# Patient Record
Sex: Female | Born: 1940 | Race: White | Hispanic: No | Marital: Married | Smoking: Never smoker
Health system: Southern US, Community
[De-identification: ages and names within clinical notes are randomized; demographics above are authoritative.]

## PROBLEM LIST (undated history)

## (undated) DIAGNOSIS — Z950 Presence of cardiac pacemaker: Secondary | ICD-10-CM

## (undated) DIAGNOSIS — I493 Ventricular premature depolarization: Secondary | ICD-10-CM

## (undated) DIAGNOSIS — I442 Atrioventricular block, complete: Secondary | ICD-10-CM

## (undated) DIAGNOSIS — Z9889 Other specified postprocedural states: Secondary | ICD-10-CM

## (undated) DIAGNOSIS — M1990 Unspecified osteoarthritis, unspecified site: Secondary | ICD-10-CM

## (undated) DIAGNOSIS — K219 Gastro-esophageal reflux disease without esophagitis: Secondary | ICD-10-CM

## (undated) DIAGNOSIS — I10 Essential (primary) hypertension: Secondary | ICD-10-CM

## (undated) DIAGNOSIS — B029 Zoster without complications: Secondary | ICD-10-CM

## (undated) HISTORY — DX: Essential (primary) hypertension: I10

## (undated) HISTORY — PX: CHOLECYSTECTOMY: SHX55

## (undated) HISTORY — PX: JOINT REPLACEMENT: SHX530

## (undated) HISTORY — PX: OTHER SURGICAL HISTORY: SHX169

## (undated) HISTORY — DX: Other specified postprocedural states: Z98.890

## (undated) HISTORY — DX: Zoster without complications: B02.9

## (undated) HISTORY — DX: Ventricular premature depolarization: I49.3

## (undated) HISTORY — DX: Atrioventricular block, complete: I44.2

## (undated) HISTORY — DX: Unspecified osteoarthritis, unspecified site: M19.90

## (undated) HISTORY — DX: Presence of cardiac pacemaker: Z95.0

## (undated) HISTORY — PX: ERCP: SHX60

---

## 1999-12-05 ENCOUNTER — Ambulatory Visit (HOSPITAL_COMMUNITY): Admission: RE | Admit: 1999-12-05 | Discharge: 1999-12-05 | Payer: Self-pay | Admitting: Cardiology

## 2004-04-22 ENCOUNTER — Ambulatory Visit (HOSPITAL_COMMUNITY): Admission: RE | Admit: 2004-04-22 | Discharge: 2004-04-22 | Payer: Self-pay | Admitting: Emergency Medicine

## 2007-04-27 ENCOUNTER — Encounter: Admission: RE | Admit: 2007-04-27 | Discharge: 2007-04-27 | Payer: Self-pay | Admitting: Orthopaedic Surgery

## 2007-07-02 ENCOUNTER — Ambulatory Visit: Payer: Self-pay | Admitting: Internal Medicine

## 2007-07-03 ENCOUNTER — Ambulatory Visit (HOSPITAL_COMMUNITY): Admission: RE | Admit: 2007-07-03 | Discharge: 2007-07-03 | Payer: Self-pay | Admitting: Internal Medicine

## 2007-07-03 ENCOUNTER — Ambulatory Visit: Payer: Self-pay | Admitting: Internal Medicine

## 2007-07-13 ENCOUNTER — Ambulatory Visit: Payer: Self-pay

## 2007-07-14 ENCOUNTER — Ambulatory Visit: Payer: Self-pay | Admitting: Internal Medicine

## 2007-07-14 ENCOUNTER — Ambulatory Visit (HOSPITAL_COMMUNITY): Admission: RE | Admit: 2007-07-14 | Discharge: 2007-07-15 | Payer: Self-pay | Admitting: Internal Medicine

## 2007-07-22 ENCOUNTER — Ambulatory Visit: Payer: Self-pay

## 2007-08-06 ENCOUNTER — Encounter: Admission: RE | Admit: 2007-08-06 | Discharge: 2007-08-06 | Payer: Self-pay | Admitting: Orthopaedic Surgery

## 2007-09-02 ENCOUNTER — Ambulatory Visit: Payer: Self-pay | Admitting: Internal Medicine

## 2007-10-15 ENCOUNTER — Encounter: Admission: RE | Admit: 2007-10-15 | Discharge: 2007-10-15 | Payer: Self-pay | Admitting: Orthopaedic Surgery

## 2007-11-23 ENCOUNTER — Encounter: Admission: RE | Admit: 2007-11-23 | Discharge: 2007-11-23 | Payer: Self-pay | Admitting: Orthopedic Surgery

## 2007-12-03 ENCOUNTER — Ambulatory Visit (HOSPITAL_COMMUNITY): Admission: RE | Admit: 2007-12-03 | Discharge: 2007-12-04 | Payer: Self-pay | Admitting: Orthopedic Surgery

## 2008-01-25 ENCOUNTER — Encounter: Admission: RE | Admit: 2008-01-25 | Discharge: 2008-01-25 | Payer: Self-pay | Admitting: Orthopedic Surgery

## 2008-09-06 ENCOUNTER — Ambulatory Visit: Payer: Self-pay | Admitting: Internal Medicine

## 2008-09-30 HISTORY — PX: BACK SURGERY: SHX140

## 2008-12-07 ENCOUNTER — Ambulatory Visit: Payer: Self-pay | Admitting: Internal Medicine

## 2008-12-09 ENCOUNTER — Encounter: Payer: Self-pay | Admitting: Internal Medicine

## 2008-12-12 ENCOUNTER — Encounter: Payer: Self-pay | Admitting: Internal Medicine

## 2008-12-12 ENCOUNTER — Ambulatory Visit: Payer: Self-pay | Admitting: Internal Medicine

## 2008-12-22 ENCOUNTER — Ambulatory Visit: Payer: Self-pay | Admitting: *Deleted

## 2009-01-11 ENCOUNTER — Inpatient Hospital Stay (HOSPITAL_COMMUNITY): Admission: RE | Admit: 2009-01-11 | Discharge: 2009-01-15 | Payer: Self-pay | Admitting: Orthopedic Surgery

## 2009-01-12 ENCOUNTER — Ambulatory Visit: Payer: Self-pay | Admitting: Vascular Surgery

## 2009-01-12 ENCOUNTER — Encounter (INDEPENDENT_AMBULATORY_CARE_PROVIDER_SITE_OTHER): Payer: Self-pay | Admitting: Orthopedic Surgery

## 2009-03-08 ENCOUNTER — Ambulatory Visit: Payer: Self-pay | Admitting: Internal Medicine

## 2009-03-09 ENCOUNTER — Encounter: Payer: Self-pay | Admitting: Internal Medicine

## 2009-03-23 ENCOUNTER — Encounter: Payer: Self-pay | Admitting: Internal Medicine

## 2009-05-18 ENCOUNTER — Telehealth (INDEPENDENT_AMBULATORY_CARE_PROVIDER_SITE_OTHER): Payer: Self-pay | Admitting: *Deleted

## 2009-06-09 ENCOUNTER — Encounter: Payer: Self-pay | Admitting: Internal Medicine

## 2009-06-12 ENCOUNTER — Encounter: Payer: Self-pay | Admitting: Internal Medicine

## 2009-06-13 ENCOUNTER — Ambulatory Visit: Payer: Self-pay | Admitting: Internal Medicine

## 2009-06-15 ENCOUNTER — Encounter: Admission: RE | Admit: 2009-06-15 | Discharge: 2009-06-15 | Payer: Self-pay | Admitting: Orthopedic Surgery

## 2009-06-22 ENCOUNTER — Encounter: Payer: Self-pay | Admitting: Internal Medicine

## 2009-09-11 ENCOUNTER — Ambulatory Visit: Payer: Self-pay | Admitting: Internal Medicine

## 2009-12-13 ENCOUNTER — Ambulatory Visit: Payer: Self-pay | Admitting: Internal Medicine

## 2009-12-26 ENCOUNTER — Encounter: Payer: Self-pay | Admitting: Internal Medicine

## 2010-03-14 ENCOUNTER — Ambulatory Visit: Payer: Self-pay | Admitting: Internal Medicine

## 2010-03-15 ENCOUNTER — Encounter: Payer: Self-pay | Admitting: Internal Medicine

## 2010-04-12 ENCOUNTER — Encounter: Payer: Self-pay | Admitting: Internal Medicine

## 2010-06-14 ENCOUNTER — Ambulatory Visit: Payer: Self-pay | Admitting: Internal Medicine

## 2010-07-11 ENCOUNTER — Encounter: Payer: Self-pay | Admitting: Internal Medicine

## 2010-10-02 ENCOUNTER — Ambulatory Visit
Admission: RE | Admit: 2010-10-02 | Discharge: 2010-10-02 | Payer: Self-pay | Source: Home / Self Care | Attending: Internal Medicine | Admitting: Internal Medicine

## 2010-10-02 ENCOUNTER — Encounter: Payer: Self-pay | Admitting: Internal Medicine

## 2010-10-08 ENCOUNTER — Telehealth: Payer: Self-pay | Admitting: Internal Medicine

## 2010-10-30 NOTE — Letter (Signed)
Summary: Remote Device Check  [employer], Main Office  [address]   [place], [place] [address]   Phone: [phone]  Fax: [phone]     December 26, 2009 MRN: [id number]   Amanda Bryant [address] [place], [place]  [address]   Dear [name],   Your remote transmission was recieved and reviewed by your physician.  All diagnostics were within normal limits for you.  __X___Your next transmission is scheduled for:  March 14, 2010.  Please transmit at any time this day.  If you have a wireless device your transmission will be sent automatically.     Sincerely,  Proofreader

## 2010-10-30 NOTE — Letter (Signed)
Summary: Remote Device Check  [employer], Main Office  [address]   [place], [place] [address]   Phone: [phone]  Fax: [phone]     April 12, 2010 MRN: [id number]   Amanda Bryant [address] [place], [place]  [address]   Dear Ms. Callender,   Your remote transmission was recieved and reviewed by your physician.  All diagnostics were within normal limits for you.  __X___Your next transmission is scheduled for:   06-14-2010.  Please transmit at any time this day.  If you have a wireless device your transmission will be sent automatically.   Sincerely,  Vella Kohler

## 2010-10-30 NOTE — Letter (Signed)
Summary: Remote Device Check  [facility] [employer], Main Office  [address]   [place], [place] [address]   Phone: [phone]  Fax: [phone]     July 11, 2010 MRN: [id number]   ZARIELLE CEA [address] [place], [place]  [address]   Dear Ms. Googe,   Your remote transmission was recieved and reviewed by your physician.  All diagnostics were within normal limits for you.   ___X___Your next office visit is scheduled for:  10-02-2010 with Dr Graciela Husbands in [place].    Sincerely,  Vella Kohler

## 2010-10-30 NOTE — Cardiovascular Report (Signed)
Summary: Office Visit Remote   Office Visit Remote   Imported By: Roderic Ovens 04/16/2010 12:28:27  _____________________________________________________________________  External Attachment:    Type:   Image     Comment:   External Document

## 2010-10-30 NOTE — Cardiovascular Report (Signed)
Summary: Office Visit Remote   Office Visit Remote   Imported By: Roderic Ovens 12/28/2009 11:53:08  _____________________________________________________________________  External Attachment:    Type:   Image     Comment:   External Document

## 2010-10-30 NOTE — Cardiovascular Report (Signed)
Summary: Office Visit Remote   Office Visit Remote   Imported By: Roderic Ovens 07/12/2010 11:05:28  _____________________________________________________________________  External Attachment:    Type:   Image     Comment:   External Document

## 2010-11-01 NOTE — Progress Notes (Signed)
Summary: premed   Phone Note From Other Clinic   Caller: dental ofc Summary of Call: does pt need to have premeds for dental work. ofc [phone] fax [phone] Initial call taken by: Edman Circle,  October 08, 2010 2:52 PM  Follow-up for Phone Call        the dental office has been pre medicating due to murmor. see murmor 1-2/6 documentation from year ago and adv to continue to medicate as they have been for previous years. They will also let the pt know that it is time for f/u W/Dr. Graciela Husbands.  Follow-up by: Claris Gladden RN,  October 08, 2010 3:07 PM

## 2010-11-01 NOTE — Procedures (Signed)
Summary: Cardiology Device Clinic    Allergies: No Known Drug Allergies  PPM Specifications Following MD:  Sherryl Manges, MD     PPM Vendor:  Medtronic     PPM Model Number:  [id number]     PPM Serial Number:  [id number] PPM DOI:  07/14/2007     PPM Implanting MD:  Sherryl Manges, MD  Lead 1    Location: RA     DOI: 07/14/2007     Model #: [id number]     Serial #: [id number]     Status: active Lead 2    Location: RV     DOI: 07/14/2007     Model #: [id number]     Serial #: [id number]     Status: active  Magnet Response Rate:  BOL 85 ERI  65  Indications:  CHB;Syncope   PPM Follow Up Remote Check?  No Battery Voltage:  2.77 V     Battery Est. Longevity:  6.5 years     Pacer Dependent:  No       PPM Device Measurements Atrium  Amplitude: 2.8 mV, Impedance: 512 ohms, Threshold: 0.5 V at 0.4 msec Right Ventricle  Amplitude: 8.0 mV, Impedance: 479 ohms, Threshold: 0.5 V at 0.4 msec  Episodes MS Episodes:  2     Percent Mode Switch:  <0.1%     Coumadin:  No Ventricular High Rate:  1     Atrial Pacing:  0.9%     Ventricular Pacing:  1.1%  Parameters Mode:  DDD+     Lower Rate Limit:  50     Upper Rate Limit:  130 Paced AV Delay:  190     Sensed AV Delay:  160 Next Remote Date:  01/03/2011     Next Cardiology Appt Due:  10/01/2011 Tech Comments:  >254 rate drop episodes.  1VHR episode 4 seconds probable VT.  No parameter changes.  Device function normal.   [facility] transmissions every 3 months.  ROV 1 year with Dr. Graciela Husbands in [place]. Altha Harm, LPN  October 02, 2010 9:22 AM

## 2011-01-03 ENCOUNTER — Ambulatory Visit (INDEPENDENT_AMBULATORY_CARE_PROVIDER_SITE_OTHER): Payer: Self-pay | Admitting: *Deleted

## 2011-01-03 DIAGNOSIS — I442 Atrioventricular block, complete: Secondary | ICD-10-CM

## 2011-01-03 DIAGNOSIS — Z950 Presence of cardiac pacemaker: Secondary | ICD-10-CM

## 2011-01-03 DIAGNOSIS — R0989 Other specified symptoms and signs involving the circulatory and respiratory systems: Secondary | ICD-10-CM

## 2011-01-04 ENCOUNTER — Other Ambulatory Visit: Payer: Self-pay

## 2011-01-07 NOTE — Progress Notes (Signed)
Pacer remote check  

## 2011-01-09 LAB — HEMOGLOBIN AND HEMATOCRIT, BLOOD
HCT: 31 % — ABNORMAL LOW (ref 36.0–46.0)
HCT: 34.7 % — ABNORMAL LOW (ref 36.0–46.0)
Hemoglobin: 10.9 g/dL — ABNORMAL LOW (ref 12.0–15.0)

## 2011-01-09 LAB — TYPE AND SCREEN
ABO/RH(D): A NEG
Antibody Screen: NEGATIVE

## 2011-01-09 LAB — ABO/RH: ABO/RH(D): A NEG

## 2011-01-09 LAB — CBC
MCHC: 34.7 g/dL (ref 30.0–36.0)
MCV: 91.9 fL (ref 78.0–100.0)
Platelets: 191 10*3/uL (ref 150–400)
RDW: 13.3 % (ref 11.5–15.5)

## 2011-01-09 LAB — CROSSMATCH: ABO/RH(D): A NEG

## 2011-01-09 LAB — BASIC METABOLIC PANEL
BUN: 12 mg/dL (ref 6–23)
CO2: 29 mEq/L (ref 19–32)
Chloride: 107 mEq/L (ref 96–112)
Creatinine, Ser: 0.62 mg/dL (ref 0.4–1.2)

## 2011-01-20 ENCOUNTER — Encounter: Payer: Self-pay | Admitting: *Deleted

## 2011-01-21 ENCOUNTER — Encounter: Payer: Self-pay | Admitting: Cardiovascular Disease

## 2011-02-12 NOTE — Assessment & Plan Note (Signed)
[facility]                         ELECTROPHYSIOLOGY OFFICE NOTE   Amanda Bryant, Amanda Bryant                       MRN:          [id number]  DATE:07/22/2007                            DOB:          1941/06/17    Amanda Bryant was seen today in the clinic on July 22, 2007, for a wound  check of her newly-implanted Medtronic model number [id number] Adapta.  Date  of implant was July 14, 2007, for complete heart block and syncope.   On interrogation of her device today, her battery voltage is 2.79, P-  waves measured 1.4 to 2 millivolts with an atrial capture threshold of  0.25 volts at 0.4 milliseconds and an atrial lead impedance at 662 ohms.  R-waves measured 8 to 11.20 millivolts with a ventricular capture  threshold of 0.5 volts at 0.4 milliseconds and a ventricular lead  impedance of 662 ohms.  Underlying rhythm today is a sinus rhythm at 76  beats a minute.  She is only ventricularly pacing 0.4% of the time.  Capture adaptive is programmed on in both the A and V.   Her Steri-Strips were removed, both wounds from where her loop was  removed and the pacer insertion are without redness, slight amount of  edema at the pacer insertion site.  No changes were made in her  parameters and she will be seen back in December.      Altha Harm, LPN  Electronically Signed      Duke Salvia, MD, [facility]  Electronically Signed   PO/MedQ  DD: 07/22/2007  DT: 07/22/2007  Job #: [id number]

## 2011-02-12 NOTE — Op Note (Signed)
NAMEDEBRAH, Amanda Bryant                ACCOUNT NO.:  [id number]   MEDICAL RECORD NO.:  [id number]          PATIENT TYPE:  INP   LOCATION:  5017                         FACILITY:  [facility]   PHYSICIAN:  Alvy Beal, MD    DATE OF BIRTH:  1941/07/15   DATE OF PROCEDURE:  01/11/2009  DATE OF DISCHARGE:                               OPERATIVE REPORT   PREOPERATIVE DIAGNOSIS:  Degenerative spondylolisthesis with diskogenic  back pain, L5-S1.   POSTOPERATIVE DIAGNOSIS:  Degenerative spondylolisthesis with diskogenic  back pain, L5-S1.   OPERATIVE PROCEDURE:  Anterior lumbar interbody fusion, L5-S1.   COMPLICATIONS:  None.   CONDITION:  Stable.   FIRST ASSISTANT:  Crissie Reese, PA.   INSTRUMENTATION USED:  Stryker PEEK interbody cage.  It was a 8-degree  lordotic, 14-mm high cage packed with Actifuse with the RSB anterior  zero profile lumbar plate.   HISTORY:  This is a very pleasant 70 year old woman who has been having  chronic debilitating low back pain for several years now.  She had a  previous L5-S1 diskectomy, but she still has persistent back pain.  Attempts at conservative management had included injection therapy,  narcotic and nonnarcotic pain medications, physiotherapy manipulations,  and nothing really has significantly helped her.  After discussing the  treatment options with the patient, she consented to the aforementioned  procedure.  All appropriate risks, benefits, and alternatives were  discussed.   OPERATIVE NOTE:  The patient was brought to the operating room and  placed supine on the operating table.  After successful induction of  general anesthesia and endotracheal intubation, TED, SCDs, and a Foley  were inserted.  At this point in time, the abdomen was prepped and  draped in standard fashion.  Dr. Liliane Bade then scrubbed into the room  to do a standard anterior retroperitoneal approach to the lumbar spine.   Once he had inserted all the appropriate  retractors and fully exposed  the L5-S1 disk space, I scrubbed back into the room.  I marked the  center of the L5-S1 disk with a needle, took an AP and lateral films,  and confirmed that we are at the appropriate level.  Once this was  confirmed, I then proceeded with the diskectomy.   A 10-blade scalpel was used to perform an annulotomy and then using a  combination of pituitary rongeurs, curettes, and Kerrison rongeurs, I  resected the entire L5-S1 disk.  I then spread the interbody space with  my lamina spreader and then began working posteriorly.  I continued to  resect more and more of the annulus.  I then used the posterior  longitudinal stripper to release the annulus from the posterior aspect  of L5 and S1.  I then placed a nerve hook behind both vertebral bodies  and confirmed that I had an adequate diskectomy.  I was able to pass  freely behind the vertebral body and there was no evidence of any  significant.  I then took a 3-and 4-mm Kerrison and resected the  posterior osteophytes from the vertebral bodies.   Once I had  an adequate diskectomy and decompression complete, I then  rasped the endplates and measured with trial prosthesis.  I elected to  use the lordotic 14-mm high interbody cage as excellent purchase and  stability.  I packed the graft with Actifuse and inserted atraumatically  with the inserter device.  I then countersunk it gently.  I checked x-  rays to confirm I had satisfactory position of the prosthesis.  Once  confirmed, I then placed Actifuse anterior to the graft first to  encourage a sentinel fusion.   At this point, the RSB anterior zero profile lumbar plate was inserted  over the interbody spacer and secured with 3 screws.  Two into L5 and  one into S1.  All screws were properly torqued down and the final lock  anti-back out plate was secured.  I had excellent purchase of the  screws.  Excellent positioning of the hardware.  I took final AP and   lateral x-rays, which confirmed satisfactory position of the prosthesis,  of the graft, and hardware.  At this point, I then irrigated the wound  copiously with normal saline and then I sequentially removed the  retractor blades making sure that there was no bleeding.  I then closed  the rectus fascia with a running #1 Vicryl suture and then I closed in  sequential with a 0 running and interrupted 2-0 Vicryl sutures.  I then  closed the skin with 3-0 Monocryl.  A final intraoperative AP digital  film was taken and read by the radiologist confirming that the only  hardware present was the implanted devices.  There were no other  surgical instrumentation present.  In addition, the needle and sponge  counts were correct.  Steri-Strips and a dry dressing was applied.  The  patient was extubated and transferred to the PACU without incident.      Alvy Beal, MD  Electronically Signed     DDB/MEDQ  D:  01/11/2009  T:  01/12/2009  Job:  [id number]   cc:   Balinda Quails, M.D.

## 2011-02-12 NOTE — Assessment & Plan Note (Signed)
[facility]                                 ON-CALL NOTE   Amanda, Bryant                         MRN:          [id number]  DATE:07/11/2007                            DOB:          Dec 01, 1940    PRIMARY CARDIOLOGIST:  Dr. Graciela Husbands.  Supervising physician is Dr. Myrtis Ser.   HISTORY:  [name] is a 70 year old female who recently was  hospitalized on July 03, 2007 by Dr. Graciela Husbands for a loop monitor  implantation secondary to near syncope of uncertain etiology.  At the  time of her discharge, she states that she was told by Dr. Graciela Husbands to call  in if she ever activated her monitor.  She stated at approximately 9:20  this morning she suddenly had the episode of feeling lightheadedness  followed by ringing in the ear, and some tingling.  Again, this lasted 5  to 10 seconds.  She did not actually lose consciousness.  She states  that she feels fine now.  She tells me that she has activated the  monitor as instructed, called as instructed.   She also relates that at the time of discharge she was told that someone  from the office would call her with a followup appointment, and no one  has called her to arrange this.  After reviewing E-chart information  available, and since [name] is feeling fine, and her episodes seem  to recur infrequently, I have asked her to continue to monitor.  If she  has another episode, I explained to her to please activate the monitor  and call us back, as she may need to be seen in the emergency room.  I  instructed her if she does wish to be evaluated, the only way to  evaluate her on Saturday would be to come to the emergency room.  She  felt that was not necessary.  I advised her not to drive, which she  states that she has not since these episodes started.  She is agreeable  with the above plan.  I also informed her that I will call and leave a  message in the office in regards to her activating the event monitor,  and that  somebody needs to call her by Monday before 10 o'clock to  arrange a followup appointment, as well as interrogation of the loop.  I  advised her if someone had not called her from the office by Monday  morning at 10 o'clock that she should call them.      Joellyn Rued, PA-C  Electronically Signed      Luis Abed, MD, [facility]  Electronically Signed   EW/MedQ  DD: 07/11/2007  DT: 07/11/2007  Job #: [id number]

## 2011-02-12 NOTE — Op Note (Signed)
Amanda Bryant, Amanda Bryant                ACCOUNT NO.:  [id number]   MEDICAL RECORD NO.:  [id number]          PATIENT TYPE:  OIB   LOCATION:  2899                         FACILITY:  [facility]   PHYSICIAN:  Duke Salvia, MD, FACCDATE OF BIRTH:  08/29/1941   DATE OF PROCEDURE:  07/03/2007  DATE OF DISCHARGE:                               OPERATIVE REPORT   PREOPERATIVE DIAGNOSIS:  Syncope, left bundle branch block.   POSTOPERATIVE DIAGNOSIS:  Syncope, left bundle branch block.   PROCEDURE:  Implantable loop recorder insertion.   Following obtaining informed consent, the patient was brought to the  catheterization laboratory and placed on the fluoroscopic table in a  supine position.  After routine prep and drape, and external mapping,  lidocaine was infiltrated about 3 cm caudal from the clavicle and 1 cm  lateral to the sternum. Dissection was carried down to the layer of the  prepectoral fascia using electrocautery and sharp dissection. A small  pocket was formed.  Two 2-0 sutures were placed at the cephalad aspect  of the pocket and were then used to attach a Medtronic Reveal [id number]  recorder, serial [id number].  These leads were secured to the  prepectoral fascia.  The pocket was copiously irrigated with antibiotic  containing saline solution, hemostasis having been assured.  The wound  was then closed in three layers in normal fashion.  The wound was  washed, dried and a benzoin Steri-Strip dressing was applied.  Needle  counts, sponge counts and instrument counts were correct at the end of  the procedure according to the staff.  The patient tolerated the  procedure without apparent complication.      Duke Salvia, MD, [facility]  Electronically Signed     SCK/MEDQ  D:  07/03/2007  T:  07/04/2007  Job:  [id number]   cc:   Evie Lacks, MD

## 2011-02-12 NOTE — Letter (Signed)
July 02, 2007    Amanda Bryant, M.D.  [address]  [address]  [place], [place] [address]   RE:  Amanda Bryant  MRN:  [id number]  /  DOB:  02/08/1941   Dear Amanda Bryant,   It was a pleasure to see Amanda Bryant today in consultation because of  recurrent spells.  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  On this occasion, she was walking across the floor,  she fell to the floor, she smashed her mouth.  She ended up with some  kind of intracranial hemorrhage.  She was watched on the trauma service  for a while and then discharged.  The only thing she recalls from that  is that her blood pressure medication was reduced.   Since then, she has had recurrent episodes which have become  increasingly frequent over the last month or two.  These are fairly  stereotypical.  They are associated with lightheadedness followed by  tinnitus, the need to sit down urgently.  Her husband says she passes  out briefly with each of these episodes.  The spells then abate within  about 5-10 seconds, and she is able to get back up and go about her  business.  The recovery phase is characterized in part by some  diaphoresis as well as perioral paresthesias.   She was given an event recorder for the month of July.  She had no  episodes while wearing it.   She had an echocardiogram that was better than before.  This raised  another issue.  She underwent catheterization in 2001, it turns out.  This was done by Dr. Charlies Constable at the request of Dr. Sherril Croon because of  an echo and a Myoview undertaken in the weeks prior to the  catheterization had demonstrated an ejection fraction in the 20-35%  range.  Catheterization demonstrated nonobstructive coronary disease and  normal left ventricular function.  It was felt that noninvasive testing  had been falsely positive.   Her cardiac risk factors are notable for hypertension.   She does not  have dyspnea on exertion.  She has no chest pain or shortness of breath.  She has no orthostatic intolerance, shower intolerance, no peripheral  edema, nocturnal dyspnea, or orthopnea.   Her past medical history in addition is notable for some problems with  her low back with left leg sciatic, for which she has seen Dr. Sharolyn Douglas.   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  She has had  appendectomy and C-sections.   Further evaluation just coming through after talking to you on the phone  was that she had this 50% ICA stenosis and a question about this  abnormal echo with the subsequent normal stress echo.   CURRENT MEDICATIONS:  1. Coreg 6.25 b.i.d.  2. Benicar, dose unknown.  3. Vytorin 10.  4. Actinal 35.  5. Effexor 75.  6. Prilosec 20.  7. Aspirin 81.  8. Cyclobenzeprine.  9. Etodolac 400.   She has no known drug allergies.   SOCIAL HISTORY:  She is retired from [employer].  She has two  children, five grandchildren.  She does not use cigarettes, alcohol, or  recreational drugs.  Her husband does smoke.   PHYSICAL EXAMINATION:  She is an elderly Caucasian female appearing  younger than her stated age of 88.  Her blood pressure is 126/62.  Her pulse was 79.  Her weight was 162.  HEENT:  No icterus or xanthomata.  Neck veins are flat.  Carotids are brisk and full bilaterally without  bruits.  BACK:  Without kyphosis or scoliosis.  LUNGS:  Clear.  Heart sounds were regular with a widely split S2.  There are no  significant murmurs.  ABDOMEN:  Soft with active bowel sounds with a mildly broad abdominal  mid pulsation.  Femoral pulses were 2+.  Distal pulses are intact.  There is no  clubbing, cyanosis or edema.  NEUROLOGIC:  Grossly normal.  SKIN:  Warm and dry.   Electrocardiogram dated today demonstrated a sinus rhythm at 80 with  intervals of  0.2/0.15/0.44.  The axis was mildly leftward.   IMPRESSION:  1. Recurrent spells, including very brief syncope.  2. Left bundle branch block.  3. Cardiomyopathy, history unclear.      a.     Previously abnormal echocardiogram.      b.     Subsequent catheterization demonstrated a normal left       ventricular function with question false/positive.  4. Moderate carotid disease.  5. Low back pain with impending need for surgery.   Amanda Bryant, I think Amanda Bryant probably has intermittent incomplete heart  block, based on her history and her left bundle branch block.  I say  this, assuming that our understanding of her ventricle being normal is  in fact true.  The very brief nature of these spells is also consistent  with complete heart block as opposed to a more significant ventricular  arrhythmia, although there is some residual symptom, specifically the  perioral paresthesias.  That might suggest there is a longer acting  process ongoing, as opposed to my thought that it is just a reaction to  the episode.   Event monitoring is the most definitive way of trying to clarify this,  and in fact, about half the people who have bundle branch block and have  recurrent syncope will have either sinus node dysfunction or  intermittent complete heart block as the cause with the latter being the  significant minority.  I have reviewed this with her.  We have discussed  the potential use of transcutaneous monitoring versus implantable  monitoring.  Given the fact that she failed the noninvasive monitoring  in July, she would like to proceed with an implantable loop recorder,  and I think that is a very reasonable approach.  We have discussed the  potential benefits as well as the potential risks, including infection,  and they would like to go ahead and proceed.  We will plan to do this  just as quickly as we can.   Thank you for the consultation.    Sincerely,      Duke Salvia, MD, [facility]   Electronically Signed    SCK/MedQ  DD: 07/02/2007  DT: 07/02/2007  Job #: [id number]   CC:    Roney Marion, MD

## 2011-02-12 NOTE — Op Note (Signed)
NAMESUSY, PLACZEK                ACCOUNT NO.:  [id number]   MEDICAL RECORD NO.:  [id number]          PATIENT TYPE:  OIB   LOCATION:  3703                         FACILITY:  [facility]   PHYSICIAN:  Duke Salvia, MD, FACCDATE OF BIRTH:  1941/09/14   DATE OF PROCEDURE:  07/14/2007  DATE OF DISCHARGE:                               OPERATIVE REPORT   ADDENDUM:  This is an addendum to a previously-dictated operative  report.   I should note that following the initial incision and the formation of  the pacemaker pocket prior to insertion of the leads or venous access  being obtained, we explanted the previously-implanted loop recorder.  It  was done from the medial caudal aspect of the pacemaker pocket and the  loop pocket was exposed, the loop was removed, and the retained sutures  were also removed.      Duke Salvia, MD, [facility]  Electronically Signed     SCK/MEDQ  D:  07/14/2007  T:  07/15/2007  Job:  [id number]

## 2011-02-12 NOTE — Letter (Signed)
December 12, 2008    Alvy Beal, MD  [address]  [place] [address]   RE:  Amanda Bryant, Amanda Bryant  MRN:  [id number]  /  DOB:  1941/02/11   Dear Dr. Shon Baton:   It was a pleasure seeing Amanda Bryant at your request regarding  preoperative clearance for impending back surgery.   She has a history of chronic left bundle-branch block, normal left  ventricular function, nonischemic Myoview scanning, and syncope.  She  underwent loop recorder implantation which demonstrated intermittent  complete heart block, underwent pacemaker implantation with a Medtronic  Adapta device in 2008, and has had no recurrent syncope.   She has no complaints of chest pain, shortness of breath, or changes in  exercise tolerance.   MEDICATIONS:  1. Benicar HCT 20/12.5.  2. Carvedilol 6.25 b.i.d.  3. Effexor 75.  4. Prilosec.  5. Aspirin 81.  6. Simvastatin 20.  7. She is also on alendronate.  8. Indomethacin.   PHYSICAL EXAMINATION:  VITAL SIGNS:  Today, her blood pressure was  126/64, the pulse was 75.  LUNGS:  Clear.  NECK:  Veins were flat.  Lymph nodes were negative.  HEART:  Sounds were regular without murmurs or gallops.  ABDOMEN:  Soft.  EXTREMITIES:  Without edema.  There is no clubbing or cyanosis.  NEUROLOGIC:  Grossly normal.  SKIN:  Warm and dry.   Electrocardiogram dated today demonstrated left bundle-branch block with  a rate of 75 and rhythm that was sinus.  The intervals were  0.19/0.16/0.45.   IMPRESSION:  1. Syncope.  2. Left bundle-branch block with intermittent complete heart block.  3. Status post loop recorder with subsequent change out for pacemaker      (MDT).  4. Normal left ventricular function, nonischemic Myoview.  5. Impending back surgery.   Dr. Shon Baton, Amanda Bryant should be an acceptable risk for her surgery.  We  would be glad to be available as necessary perioperatively.   There should be no concerns with inhibition of her pacemaker.  She is  not  device dependent.  In the event that she would develop intermittent  complete heart block, removal of a magnet placed at the time of the  procedure would suffice to restore pacemaker function.   Thank you very much for allowing [place] to see her in anticipation of the  procedure.    Sincerely,      Duke Salvia, MD, [facility]  Electronically Signed    SCK/MedQ  DD: 12/12/2008  DT: 12/13/2008  Job #: [id number]

## 2011-02-12 NOTE — Consult Note (Signed)
VASCULAR SURGERY CONSULTATION   Amanda Bryant, Amanda Bryant  DOB:  June 20, 1941                                       12/22/2008  ZOXWR#:[id number]   REFERRAL DIAGNOSIS:  L5-S1 degenerative disease.   HISTORY:  The patient is a 70 year old female with a long history of  back pain.  Has previously undergone translumbar decompression of L5-S1  nerve roots by Dr. Darrelyn Hillock.  She has now been referred to Dr. Shon Baton for  chronic back pain.  Multiple failed attempts at conservative management.  Will require L5-S1 ALIF.   PERTINENT MEDICAL HISTORY:  Reveals no DVT.  She has had previous  gynecologic surgery, hysterectomy.  She had a cholecystectomy and  appendectomy.   PAST MEDICAL HISTORY:  1. Heart murmur.  2. Hypertension.  3. GERD.  4. Pacemaker placement.   MEDICATIONS:  1. Benicar 20/12.5 daily.  2. Carvedilol 6.25 mg 2 times daily.  3. Simvastatin 20 mg daily.  4. Venlafaxine 37.5 daily.  5. Prilosec 1 tablet daily.  6. Aspirin 81 mg daily.  7. Caltrate 600 Plus D 2 tablets daily.  8. Fish Oil 1000 mg daily.  9. Centrum 1 tablet daily.   ALLERGIES:  Codeine and surgical tape.   FAMILY HISTORY:  Noncontributory.   SOCIAL HISTORY:  The patient is married, she is now retired.  No tobacco  or alcohol use.   REVIEW OF SYSTEMS:  The patient notes a history of a heart murmur.  Chronic constipation.  Arthritic joint discomfort, chronic back pain.   PHYSICAL EXAM:  General:  A well-appearing 70 year old female, appeared  alert and oriented, no distress.  Vital Signs:  BP 103/65, pulse is 76  per minute.  HEENT:  Unremarkable.  Neck:  Supple, no thyromegaly or  adenopathy.  Chest:  Equal air entry bilaterally, no rales or rhonchi.  Cardiovascular:  Normal heart sounds without murmurs.  No gallops or  rubs.  No carotid bruits.  Abdomen:  Soft, nontender.  Lower midline  scar noted.  No hernia.  No masses or organomegaly.  Lower Extremities:  Intact femoral, popliteal and  dorsalis pedis pulses bilaterally.  No  ankle edema.   IMPRESSION:  1. Chronic back pain associated with L5-S1 degenerative disk disease.  2. Heart murmur.  3. Permanent pacemaker.  4. Hypertension.  5. GERD.   PLAN:  The patient is a candidate to undergo L5-S1 ALIF.  Will plan to  schedule surgery with Dr. Shon Baton.  Risks of the operative procedure  explained in detail including potential DVT, pulmonary embolus,  bleeding, transfusion, limb ischemia, ureter injury, or other major  complication.   Balinda Quails, M.D.  Electronically Signed  PGH/MEDQ  D:  12/22/2008  T:  12/23/2008  Job:  [id number]

## 2011-02-12 NOTE — Assessment & Plan Note (Signed)
[facility]                        [place] [facility] OFFICE NOTE   Amanda, Bryant                       MRN:          [id number]  DATE:10/02/2010                            DOB:          [birth year]    Amanda Bryant is seen for followup for intermittent complete heart block.  She is status post pacemaker implantation.   She is doing quite well without complaints of chest pain, shortness of  breath, or dizziness.   Medications include carvedilol 6.25 and Benicar 20/12.5, as well as  raloxifene and simvastatin.   On examination, her blood pressure 118/60, her pulse was 71.  Her lungs  were clear.  Heart sounds were regular.  The abdomen was soft.  Extremities without edema.  She is in no acute distress.  Her skin was  warm and dry.   Interrogation of her Medtronic adaptive pulse generator demonstrates  that she has estimated longevity of 6.5 years.  She is atrially paced  about 1% of the time, she is ventricular paced, about 1.1% of the time,  she has infrequent PVCs.  R-wave was 8.  Atrial impedance was 512,  ventricular impedance was 479.  Atrial threshold was 0.5.  Ventricular  threshold 0.5 and P-wave was 2.8.   IMPRESSION:  1. Intermittent complete heart block.  2. Status post pacer for the above.  3. Premature ventricular contractions - quiescent.  4. Hypertension - well controlled.   Amanda Bryant is doing quite well.  I have asked that she follow up with  her primary care physician regarding change of her antihypertensives.  She is taking Benicar 20/12.5.  She thinks the carvedilol has helped the  PVCs, I would suggest that we discontinue the hydrochlorothiazide.   We will see her again 1 year's time.     Duke Salvia, MD, [facility]     SCK/MedQ  DD: 10/02/2010  DT: 10/02/2010  Job #: [id number]   cc:   Roney Marion

## 2011-02-12 NOTE — Assessment & Plan Note (Signed)
[facility]                         ELECTROPHYSIOLOGY OFFICE NOTE   Amanda, Bryant                       MRN:          [id number]  DATE:09/06/2008                            DOB:          05-Sep-1941    Amanda Bryant was seen in followup for pacemaker planted about a year ago  for complete heart block.  She has had no recurrent syncope.  She feels  really terrific.  She has a little bit of shortness of breath though  largely none.   Her medications include Benicar/HCT 20/12.5, carvedilol 6.25 t.i.d.,  Effexor 75, Prilosec, aspirin, and simvastatin.   On examination, blood pressure was 112/65 with a pulse of 66, weight was  156.  Lungs were clear.  Heart sounds were regular.  Extremities were  without edema.   Interrogation of Medtronic Adapta pulse generator demonstrates P wave of  20 with impedance of 580, threshold 0.375 at 0.4.  The R-wave with an  impedance of 507, threshold 0.625 at 0.4, battery voltage 2.77.   Her multiple atrial high-grade episodes, the longest of which was 23.  There are more than 254 rate-drop episodes.   IMPRESSION:  1. Syncope.  2. Intermittent complete heart block.  3. Neural mediation of the above.   Amanda Bryant is doing pretty well.  We will continue to leave her  pacemaker program the way it is as she has been rendered largely  asymptomatic by the above.  We will see her again in 1 year's time.     Amanda Salvia, MD, [facility]  Electronically Signed    SCK/MedQ  DD: 09/06/2008  DT: 09/07/2008  Job #: [id number]

## 2011-02-12 NOTE — Assessment & Plan Note (Signed)
[facility]                         ELECTROPHYSIOLOGY OFFICE NOTE   KATIEJO, GILROY                       MRN:          [id number]  DATE:09/02/2007                            DOB:          Feb 12, 1941    Ms. Kozloski is status post pacemaker implantation for syncope with  intermittent complete heart block documented by loop recorder.  She has  had another episode of presyncope.  It was quite similar to the previous  episodes.   Her medications are unchanged.   On examination, her blood pressure is 117/63, her pulse is 66.  Her  lungs were clear, heart sounds were regular, the extremities were  without edema.   Interrogation of her pacemaker demonstrated normal impedances.  The P  wave was greater than 2-3, the R wave was greater than 12.  Battery  voltages were normal.  What was interesting is that there were a normal  of atrial high-rate episodes on there that were associated with atrial  far field over-sensing and I reprogrammed the device to a fixed atrial  sensitivity of 0.7.   I also activated rate drop response to see if we could mitigate against  what is likely neurally-mediated heart block.   She understands.  We will see her again in 9 months' time.     Duke Salvia, MD, [facility]  Electronically Signed    SCK/MedQ  DD: 09/02/2007  DT: 09/02/2007  Job #: [id number]

## 2011-02-12 NOTE — Discharge Summary (Signed)
NAMERYVER, ZADROZNY                ACCOUNT NO.:  [id number]   MEDICAL RECORD NO.:  [id number]          PATIENT TYPE:  OIB   LOCATION:  3703                         FACILITY:  [facility]   PHYSICIAN:  Duke Salvia, MD, FACCDATE OF BIRTH:  1941/05/26   DATE OF ADMISSION:  07/14/2007  DATE OF DISCHARGE:  07/15/2007                               DISCHARGE SUMMARY   DISCHARGE DIAGNOSIS:  Syncope in the setting of complete heart block.   SECONDARY DIAGNOSES:  1. History of left bundle-branch block.  2. Hypertension.  3. Status post hysterectomy.  4. Status post cholecystectomy.  5. Status appendectomy.  6. Status post C-section.  7. Moderate carotid artery disease   ALLERGIES:  NO KNOWN DRUG ALLERGIES.   PROCEDURES:  Loop recorder explant and implantation of a Medtronic  Adapta ADDR 01 serial #[id number] dual-chamber permanent pacemaker.   HISTORY OF PRESENT ILLNESS:  A 70 year old female with 3-4 year history  of recurrent syncope, recently seen by Dr. Graciela Husbands in clinic on October 2  who subsequently underwent placement of an implantable loop recorder on  October 3.  On October 11, she had a near syncopal event and activated  her device.  Interrogation of the loop noted complete heart block.  She  subsequently scheduled for permanent pacemaker placement.   HOSPITAL COURSE:  The patient presented to the electrophysiology lab on  October 14 and underwent successful placement of Medtronic Adapta ADDR  01 dual-chamber permanent pacemaker.  The loop recorder was also  explanted.  The patient tolerated this procedure well and postprocedure  chest x-ray shows no evidence of pneumothorax and otherwise no acute  chest findings.  She will be discharged home this afternoon in  satisfactory condition.   DISCHARGE LABORATORY DATA:  Hemoglobin 13.9, hematocrit 40.2, WBC 7.4,  platelets 187.  Sodium 131, potassium 4.5, chloride 109, CO2 25, BUN 12,  creatinine 0.57, glucose 91, calcium 9.5.   DISPOSITION:  The patient is being discharged home today in good  condition.   FOLLOW-UP APPOINTMENTS:  She had follow-up at [facility] on October 22 at 9:00 a.m.Marland Kitchen  She will follow up with Dr. Sherryl Manges on December 30 12:45 p.m..   DISCHARGE MEDICATIONS:  1. Benicar HCT 20/12.5 mg daily.  2. Vytorin 10 mg daily.  3. Actonel 150 mg monthly.  4. Coreg 6.25 mg b.i.d.  5. Effexor XR 25 mg daily.  6. Prilosec 40 mg daily.  7. Aspirin 81 mg daily.  8. Percocet as previously prescribed.   OUTSTANDING LABORATORY STUDIES:  None.   DURATION DISCHARGE ENCOUNTER:  Forty five minutes.      Nicolasa Ducking, ANP      Duke Salvia, MD, [facility]  Electronically Signed    CB/MEDQ  D:  07/15/2007  T:  07/16/2007  Job:  [id number]

## 2011-02-12 NOTE — Op Note (Signed)
[name], [name]                ACCOUNT NO.:  [id number]   MEDICAL RECORD NO.:  [id number]          PATIENT TYPE:  OIB   LOCATION:  1621                         FACILITY:  [facility]   PHYSICIAN:  Georges Lynch. Gioffre, M.D.DATE OF BIRTH:  Dec 17, 1940   DATE OF PROCEDURE:  12/03/2007  DATE OF DISCHARGE:                               OPERATIVE REPORT   SURGEON:  Georges Lynch. Darrelyn Hillock, M.D.   ASSISTANT:  Marlowe Kays, M.D.   PREOPERATIVE DIAGNOSES:  1. 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  2. 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  All her pain was in the left leg only preoperatively.   POSTOPERATIVE DIAGNOSES:  1. 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  2. 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  All her pain was in the left leg only preoperatively.   OPERATION:  Decompression of the lateral recess and involving the L5 and  the S1 nerve roots, 2 levels on the left.  The foraminotomies also were  carried out for both roots.   PROCEDURE:  Under general anesthesia, a routine orthopedic prepping and  draping of the lower back was carried out.  She was placed a spinal  frame in the usual fashion.  She was given 1 gram of IV Ancef.  At this  time, after the sterile prep and draping were done, I then inserted 2  needles into the skin over the lumbar region and x-ray was taken.  Following that, the needles were removed and an incision was made over  the L5-S1 interspace in the usual fashion.  At this time, the muscle was  stripped from the lamina and the spinous process on the left at L5-S1.  Self-retaining retractors were inserted and another x-ray was taken to  verify the exact position.  This time, we removed most of the lamina of  L5; we also went down and did a hemilaminectomy of the sacrum and  exposed the S1 root.  Note:  She had an extremely abundant thickened  ligamentum flavum that literally was  compressing the S1 root and the  dura and involved the 5 root above.  We first went out laterally,  decompressed the lateral recess out widely enough and then we were able  to expose the scarred down ligamentum flavum.  We literally teased the  ligamentum flavum off the dura.  We then inserted a cottonoid to protect  the dura and then removed the ligamentum flavum as we decompressed the  recess.  We went down into the foramen of the S1 root and also the 5  roots.  The roots now were quite free and we were able to easily pass a  hockey-stick out the foramina of both roots on the left.  The dura now  was free.  There was no definite herniated disk.  We thoroughly  irrigated out the area and we did use the  microscope by the way.  I loosely applied some thrombin-soaked Gelfoam  was closed the wound in  layers in usual fashion.  I did leave the  proximal deep part of the wound partially open for drainage purposes.  Subcu was closed with Vicryl, skin was closed metal staples and a  sterile Neosporin dressing was applied.           ______________________________  Georges Lynch Darrelyn Hillock, M.D.     RAG/MEDQ  D:  12/03/2007  T:  12/04/2007  Job:  [id number]   cc:   Sherren Kerns MD

## 2011-02-12 NOTE — Op Note (Signed)
Amanda Bryant, Amanda Bryant                ACCOUNT NO.:  [id number]   MEDICAL RECORD NO.:  [id number]          PATIENT TYPE:  OIB   LOCATION:  3703                         FACILITY:  [facility]   PHYSICIAN:  Duke Salvia, MD, FACCDATE OF BIRTH:  Jan 06, 1941   DATE OF PROCEDURE:  07/14/2007  DATE OF DISCHARGE:                               OPERATIVE REPORT   PREOPERATIVE DIAGNOSIS:  Syncope with previously implanted loop recorder  now documented intermittent complete heart block.   POSTOPERATIVE DIAGNOSIS:  Syncope with previously implanted loop  recorder now documented intermittent complete heart block.   PROCEDURE:  Explantation of a previously implanted loop recorder,  implantation of a dual-chamber pacemaker.   Following obtaining informed consent, the patient was brought to the  electrophysiology laboratory and placed on the fluoroscopic table in  supine position.  After routine prep and drape of left upper chest,  lidocaine was infiltrated in prepectoral subclavicular region.  Incision  was made and carried down to layer of the prepectoral fascia utilizing  electrocautery and sharp dissection.  A pocket was formed similarly.  Hemostasis was obtained.   Thereafter attention was turned to gaining access to extrathoracic left  subclavian vein which was accomplished without difficulty and without  the aspiration of air or puncture of the artery.  Two separate  venipunctures were accomplished.  Guidewires were placed and retained  and 0 silk suture was placed in a figure-of-eight fashion and allowed to  hang loosely.  Sequentially Medtronic 5076 58-cm active fixation  ventricular lead serial number [id number] and Medtronic 5076 45-cm  fixation atrial lead serial number [id number].  Under fluoroscopic  guidance with a moderate amount of difficulty, we found this site the  right ventricular septal outflow tract region where the bipolar R wave  was 6.6 with a pace impedance of 1225 ohms, a  threshold of 0.8 volts at  0.5 milliseconds.  Current at threshold 0.8 MA.  The current of injury  was brisk and there is no diaphragmatic pacing at 10 volts.  This lead  was marked with a tie.   We also actually had to map the atrium fairly extensively.  We finally  found a site where the bipolar P-wave was 3.8 mV.  The pace impedance  was 1073 ohms, a threshold 2.2 volts at 0.5 milliseconds.  The current  of injury was quite brisk and current at threshold 2.7 MA.   These acceptable parameters recorded, albeit not ideal the leads were  attached to a Medtronic Adaptic ADDR01 pulse generator serial number  [id number].  Ventricular pacing and P synchronous pacing were  identified.  I should note that the patient during the procedure  developed intermittent complete heart block again.   The leads and pulse generator then placed in the pocket secured to the  prepectoral fascia.  The wound was closed in three layers in normal  fashion.  The wound was washed and dried and a benzoin Steri-Strip  dressing was applied.  Needle count, sponge counts and instrument counts were correct at the  end of procedure according to staff.  Because the patient had  developed  an allergic reaction to the tape used previously, it was not used.  Today we used paper tape.      Duke Salvia, MD, [facility]  Electronically Signed     SCK/MEDQ  D:  07/14/2007  T:  07/15/2007  Job:  [id number]   cc:   Durenda Hurt, M.D.  electrophys lab  Kennedy pacemaker cl  Evie Lacks, MD

## 2011-02-12 NOTE — Discharge Summary (Signed)
Amanda Bryant, STREET                ACCOUNT NO.:  [id number]   MEDICAL RECORD NO.:  [id number]          PATIENT TYPE:  OIB   LOCATION:  2899                         FACILITY:  [facility]   PHYSICIAN:  Duke Salvia, MD, FACCDATE OF BIRTH:  1941-04-09   DATE OF ADMISSION:  07/03/2007  DATE OF DISCHARGE:  07/03/2007                               DISCHARGE SUMMARY   ALLERGIES:  NO KNOWN DRUG ALLERGIES.   TIME SPENT AT DISCHARGE:  Dictation greater than a 25 minutes.   FINAL DIAGNOSES:  1. Syncope of unclear etiology.  2. Implant loop recorder by Dr. Sherryl Manges on July 03, 2007.   SECONDARY DIAGNOSES:  1. Hypertension.  2. Status post hysterectomy.  3. Status post cholecystectomy.  4. Status post appendectomy.  5. Status post C-section 1967, 1972.   PROCEDURE:  Amanda Bryant is a 70 year old female who has a history of 3-4  years of recurrent syncope.  They have become increasingly frequent over  the last month or two.  They are associated with lightheadedness  followed by tinnitus and the need to sit down urgently.  Her husband  says she passes out briefly with each of these episodes.  They abated  within 5-10 seconds.  She is able to get up and go about her business.  The recovery phase is characterized partly by some diaphoresis as well  as perioral paresthesias.   The patient has worn of an event recorder in July.  There were no  episodes.  In the past, the patient has had in 2001 a left heart  catheterization which showed normal left ventricular function and  nonobstructive coronary artery disease.  Her risk factor is notable for  hypertension.  She does not have dyspnea on exertion.   Amanda Bryant, with a left bundle branch block in her history, probably has  intermittent complete heart block.  The very brief nature of the spells  is also consistent with a complete heart block as opposed to a more  significant ventricular arrhythmia.   This patient has worn an event monitor  without clarifying any cardiac  dysrhythmia.  It is known that people with bundle branch block, about  half of them, have recurrent syncope either secondary to sinus node  dysfunction or intermittent complete heart block.  The patient would  like to proceed with implantation of a loop recorder since Holter  monitor approach has not yielded any significant information.  The risks  and benefits have been described to the patient who wished to proceed.   HOSPITAL COURSE:  The patient presented electively on July 03, 2007.  She underwent implantation of a Medtronic loop recorder.  She will  discharge the same day.  She had a flare of her back pain for which she  has been given two Percocet.   DISCHARGE MEDICATIONS:  She goes home on her regular medications which  are:  1. Vytorin 10/__________.  I am not sure what the other dose is, and      it is not given in the chart.  2. Benicar 20/12.5 1 tablet daily.  3.  Carvedilol 6.25 mg twice daily.  4. Effexor 75 mg daily.  5. Prilosec 40 mg daily.  6. Enteric-coated aspirin 81 mg daily.  7. Cyclobenzaprine 10 mg twice daily.  8. Hydrocodone 5/500 twice daily.  9. Etodolac 400 mg twice daily.   LABORATORY DATA:  Lab studies pertinent to this admission:  A complete  blood count revealed white cells of 7, hemoglobin 13.7, hematocrit 40.3,  platelets 219.  Protime 12.0, INR 0.9.  Serum electrolytes:  Sodium 140,  potassium 3.8, chloride 104, carbonate 27, glucose 101, BUN is 12,  creatinine 0.73.      Amanda Bryant, [place]      Duke Salvia, MD, [facility]  Electronically Signed    GM/MEDQ  D:  07/03/2007  T:  07/05/2007  Job:  [id number]   cc:   Duke Salvia, MD, [facility]  Roney Marion, M.D.  Evie Lacks, MD

## 2011-02-12 NOTE — Op Note (Signed)
[name], [name]                ACCOUNT NO.:  [id number]   MEDICAL RECORD NO.:  [id number]          PATIENT TYPE:  INP   LOCATION:  2899                         FACILITY:  [facility]   PHYSICIAN:  Balinda Quails, M.D.    DATE OF BIRTH:  04-14-1941   DATE OF PROCEDURE:  DATE OF DISCHARGE:                               OPERATIVE REPORT   SURGEON:  Balinda Quails, MD.   Mammie LorenzoDebria Garret D. Shon Baton, MD.   ANESTHESIA:  General endotracheal.   PREOPERATIVE DIAGNOSIS:  L5-S1 degenerative disk disease.   POSTOPERATIVE DIAGNOSIS:  L5-S1 degenerative disk disease.   PROCEDURE:  Left retroperitoneal anterior spine exposure L5-S1 for L5-S1  anterior lumbar interbody fusion bracket(ALIF).   CLINICAL NOTE:  Amanda Bryant is a 70 year old female with a history of  chronic back pain.  She was referred and seen in consultation in the  office prior to L5-S1 anterior lumbar interbody fusion.  The patient was  felt to be an adequate candidate for surgery, potential risks and  complications were reviewed.  The patient consented, which is scheduled  at this time.   OPERATIVE PROCEDURE:  The patient was brought to the operating room in  stable condition.  Placed in a supine position.  General endotracheal  anesthesia induced.  In the supine position, the abdomen was prepped and  draped in sterile fashion.  Foley catheter, arterial line, and central  venous catheter in place.  Pulse oximetry on the left foot.   The left lower quadrant transverse skin incision made over the  projection of L5-S1.  Subcutaneous tissue divided with electrocautery.  Left anterior rectus sheath incised.  Left rectus muscle mobilized  medially.  Left retroperitoneal space entered.  Blunt dissection carried  down onto the psoas muscle.  Genitofemoral nerve preserved on the  muscle.  Left common iliac artery identified.  The L5-S1 disk was easily  palpated.  This was a large disk with a broad height.  The soft tissues  were  pushed off the disk from left-to-right.  The middle sacral vessels  controlled with combination of clips and bipolar cautery and divided.  The disk was fully exposed from left margin to right margin with blunt  dissection.  There were no apparent complications.  The ureter was  mobilized with the abdominal contents.  No significant bleeding.   Using reverse lip blades, the disk was fully exposed with a Chief Financial Officer system.  Malleable retractors placed superiorly and  inferiorly.   The disk space was then verified by fluoroscopy and a spinal needle.   Dr. Shon Baton then completed L5-S1 ALIF along with closure.  There were no  apparent complications during exposure procedure.      Balinda Quails, M.D.  Electronically Signed     PGH/MEDQ  D:  01/11/2009  T:  01/12/2009  Job:  [id number]

## 2011-02-12 NOTE — Assessment & Plan Note (Signed)
[facility]                        [facility] OFFICE NOTE   Amanda, Bryant                       MRN:          [id number]  DATE:09/11/2009                            DOB:          05/19/1941    Amanda Bryant is seen in followup for syncope with intermittent complete  heart block identified by loop recorder implantation.  She underwent  pacemaker implantation in 2008.  She has had no recurrent syncope.   She has no complaints of chest pain or shortness of breath.  Reviewing  the records, she has had abnormal noninvasive studies a number of years  ago that prompted catheterization that clarified that the noninvasive  studies were false positives.  These were done in 2001.   MEDICATIONS:  Currently include:  1. Carvedilol 6.25.  2. Benicar 20/12.5.  3. Fluoxetine 37.5.  4. Simvastatin 20.  5. Prilosec.   EXAMINATION:  Her blood pressure 123/60.  Her pulse was 76.  She was in  no acute distress.  Her neck veins were flat.  Her carotids were brisk  and full bilaterally without bruits.  BACK:  Without kyphosis or scoliosis.  LUNGS:  Were clear.  HEART:  Sounds were regular with a 1-2/6 early systolic murmur.  ABDOMEN:  Soft with active bowel sounds.  EXTREMITIES:  Were without edema.   Interrogation of her Medtronic adapter pulse generator measured a P-wave  of 2.8 with an R-wave of 11.2.  the pace impedance was 552 in the atrium  and 504 in the ventricle and pacing threshold were 0.625 and 0.4 in the  RV and 0.375 and 0.4 in the RA.   I should note that she is pacing only about 0.9% of the time and has  100,000 PVCs.   IMPRESSION:  1. Intermittent complete heart block.  2. Syncope associated with #1.  3. Status post pacer of the above.  4. Palpitations likely premature ventricular contractions.  5. Normal left ventricular function.   Amanda Bryant is doing really very well.   We will plan to see her again in 1 year's time.  She  will be followed  remotely in the interim.     Duke Salvia, MD, [facility]  Electronically Signed    SCK/MedQ  DD: 09/11/2009  DT: 09/11/2009  Job #: [id number]

## 2011-02-15 NOTE — Cardiovascular Report (Signed)
[place]  Patient:    Amanda Bryant, Amanda Bryant                         MRN: [id number] Proc. Date: 12/05/99 Adm. Date:  [id number] Disc. Date: [id number] Attending:  Lenoria Farrier CC:         Earl Many, M.D.             Fidela Salisbury, M.D.             Bruce Elvera Lennox Juanda Chance, M.D. [facility]                        Cardiac Catheterization  PROCEDURE PERFORMED:  Right and left heart catheterization.  CLINICAL HISTORY:  Ms. Camposano is 70 years old and was recently seen by Dr. Lawana Pai. Because of a heart murmur she underwent echocardiography, which showed a reduced ejection fraction.  Because of her family history of coronary artery disease, she then underwent a Cardiolite scan which suggested anterior ischemia and also showed a reduced ejection fraction.  Her ejection fraction by echocardiogram is estimated at 35-40%, and she had minimal mitral regurgitation.  Her ejection fraction by Cardiolite was 23%.  She was referred for evaluation with catheterization.  PROCEDURAL NOTE:  The procedure was performed by the right femoral artery using  arterial sheath and 6-French preformed coronary catheters.  A front wall arterial puncture was performed and Omnipaque contrast was used.  Right heart catheterization was performed percutaneously through the right femoral vein using a Swan-Ganz thermodilution catheter.  A distal aortogram was performed to rule out renovascular causes for recently mildly elevated blood pressure.  She tolerated the procedure well and left the laboratory in satisfactory condition. The right femoral artery was closed with Perclose at the end of the procedure.  HEMODYNAMIC RESULTS: 1. Right atrial pressure:  7 mean. 2. Right ventricular pressure:  20/7. 3. Pulmonary artery pressure:  20/11. 4. Pulmonary wedge pressure:  9. 5. Left ventricular pressure:  157/16. 6. Aortic pressure:  157/78. 7. Cardiac Output/Index:  (Fick) 4.3/2.5 L/min/sq  m.  ANGIOGRAPHIC RESULTS: 1. LEFT MAIN CORONARY ARTERY:  Free of significant disease. 2. LEFT ANTERIOR DESCENDING CORONARY:  Gave rise to three septal perforators and    three diagonal branches.  There was 30% narrowing in the proximal LAD and 50%    ostial in the first diagonal branch. 3. CIRCUMFLEX CORONARY ARTERY:  Gave rise to an intermediate branch, a marginal  branch and a posterolateral branch.  These vessels were free of significant    disease. 4. RIGHT CORONARY ARTERY:  Was a moderate-sized vessel that gave rise to a right    ventricular branch, posterior descending branch and a small posterolateral    branch.  There was 30% proximal segmental narrowing.  LEFT VENTRICULOGRAPHY: Performed in the RAO projection showed good overall wall  motion, with possible very slight inferobasilar wall hypokinesis.  The estimated ejection fraction was 55%.  The left ventriculogram performed in the LAO projection showed overall good wall motion, with questionable slight akinesis of the superior septum.  DISTAL AORTOGRAPHY:  Showed patent LIMA artery and no significant aortoiliac obstruction.  CONCLUSIONS:  Minimal coronary artery disease with overall normal left ventricular function.  RECOMMENDATIONS:  The catheterization findings are markedly different than the findings suggested by echocardiogram and Cardiolite scan.  The Persantine scan as done only five days ago, so it seems likely that the scan represents a false-positive.  We will plan to continue the Altace and the Prilosec, which Dr. Sherril Croon and Dr. Lawana Pai started, until Ms. Sisler sees them back in follow-up. I spoke with Dr. Josiah Lobo (in Dr. Sherril Croon absence today) about the findings. DD:  12/05/99 TD:  12/06/99 Job: [id number]

## 2011-02-15 NOTE — Discharge Summary (Signed)
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   OPERATIVE PROCEDURE:  Anterior lumbar interbody fusion at the L5-S1  level.   CONSULTATIONS:  None.   BRIEF HISTORY:  [name] is a very pleasant 70 year old female who has  been having chronic debilitating low back pain for several years now.  She has had a previous L5-S1 diskectomy but still had ongoing persistent  back pain.  All attempts at conservative management including injection  therapy, physical therapy, narcotic and nonnarcotic medication have  failed to alleviate her symptoms.  After discussing treatment options  with the patient, she consented to the anterior lumbar interbody fusion.  All appropriate risks, benefits, and alternatives were discussed with  the patient.   HOSPITAL COURSE:  The patient's hospital course was approximately 4 days  in length.  The patient was transferred from the PACU to the Orthopedic  Floor without incident.  On postoperative day #1, the patient was doing  very well.  Her vital signs were stable.  She was neurovascularly  intact.  Her compartments were soft and nontender and she began working  with the physical therapy.  On postoperative day #2, the patient's CT  was reviewed and it demonstrated satisfactory placement of the hardware  and her Doppler was negative for any evidence of DVT.  She continued to  work well with physical therapy.  She was having some mild constipation  and therefore she  was started on a stool softener.  The patient  continued to progress well throughout her hospital stay.  Therefore, by  postoperative day #4, the patient still remained afebrile, was working  well with physical therapy, and was able to ambulate with assistance.  She had no shortness of breath and/or chest pain throughout her stay.  Her abdomen was soft and nontender.  She was having regular bowel  movements and was voiding on her own, tolerating a regular diet.  Her  hemoglobin and hematocrit remained stable throughout her stay and  therefore the patient was deemed stable to be discharged home.  The  patient was being discharged to home on Percocet 10/325, Robaxin 500 mg,  Lovenox injection, and baby aspirin.   DISCHARGE INSTRUCTIONS:  The patient is instructed to call to schedule  her appointment at [phone].  She will follow up with Dr. Shon Baton in 2  weeks for a wound check.  The patient is instructed to increase activity  slowly.  She may walk up with assistance.  She is allowed to shower  postoperatively on day #5.  She is to avoid any bending, stooping,  twisting, and/or squatting for 4-6 weeks.  She is allowed to ambulate as  much as possible.  The patient is being discharged to home with [facility].  The patient is instructed to follow up in our  office in  2 weeks.  At that point, we will check her wound.  The patient is  instructed to call the office for any increased fevers or chills, any  increased leg pain, and/or back pain, any loss of bowel or bladder  function, or redness around the incision site.  The patient is to call  our office at [phone] to schedule her appointment.      Crissie Reese, PA      Alvy Beal, MD  Electronically Signed    AC/MEDQ  D:  02/03/2009  T:  02/04/2009  Job:  [id number]

## 2011-04-04 ENCOUNTER — Encounter: Payer: Self-pay | Admitting: *Deleted

## 2011-04-08 ENCOUNTER — Encounter: Payer: Self-pay | Admitting: *Deleted

## 2011-04-11 ENCOUNTER — Other Ambulatory Visit: Payer: Self-pay | Admitting: Internal Medicine

## 2011-04-11 ENCOUNTER — Ambulatory Visit (INDEPENDENT_AMBULATORY_CARE_PROVIDER_SITE_OTHER): Payer: Medicare Other | Admitting: *Deleted

## 2011-04-11 DIAGNOSIS — I442 Atrioventricular block, complete: Secondary | ICD-10-CM

## 2011-04-11 DIAGNOSIS — R55 Syncope and collapse: Secondary | ICD-10-CM

## 2011-04-16 LAB — REMOTE PACEMAKER DEVICE
AL IMPEDENCE PM: 527 Ohm
AL IMPEDENCE PM: 527 Ohm
ATRIAL PACING PM: 2
BATTERY VOLTAGE: 2.76 V
RV LEAD AMPLITUDE: 16 mv
RV LEAD IMPEDENCE PM: 561 Ohm
RV LEAD THRESHOLD: 0.5 V
VENTRICULAR PACING PM: 18
VENTRICULAR PACING PM: 18

## 2011-04-24 ENCOUNTER — Encounter: Payer: Self-pay | Admitting: *Deleted

## 2011-04-24 NOTE — Progress Notes (Signed)
Pacer remote check  

## 2011-05-27 ENCOUNTER — Other Ambulatory Visit: Payer: Self-pay | Admitting: *Deleted

## 2011-05-27 MED ORDER — CARVEDILOL 25 MG PO TABS: 25.0000 mg | ORAL_TABLET | Freq: Two times a day (BID) | ORAL | Status: DC

## 2011-06-04 ENCOUNTER — Telehealth: Payer: Self-pay | Admitting: Internal Medicine

## 2011-06-04 DIAGNOSIS — I493 Ventricular premature depolarization: Secondary | ICD-10-CM

## 2011-06-04 DIAGNOSIS — I442 Atrioventricular block, complete: Secondary | ICD-10-CM

## 2011-06-04 NOTE — Telephone Encounter (Signed)
Pt received rx from [facility] foe carvedilol, she thought she was taking 6.5 mg twice a day but rx came as 25 mg, which is what's on her med list, pls call

## 2011-06-05 MED ORDER — CARVEDILOL 6.25 MG PO TABS: 6.2500 mg | ORAL_TABLET | Freq: Two times a day (BID) | ORAL | Status: DC

## 2011-06-05 NOTE — Telephone Encounter (Signed)
I spoke with the patient. She has been taking carvediolol 6.25mg  bid, but her RX was sent in as a 25mg  bid tablet. I explained I will contact [facility] and let them know about the error. Sherri Rad, RN, BSN   I have spoken with Stevenson Clinch at [facility]. We have corrected her RX for carvedilol for 6.25mg  bid # 180 w/ 3 refills. She has no co-pay for this per [place]. I have made the patient aware this has been fixed and she should get her new RX in the next few days. I have advised her to put up the 25mg  tabs of Carvedilol just in case her dose is adjusted at some point.

## 2011-06-24 LAB — DIFFERENTIAL
Basophils Absolute: 0
Basophils Relative: 0
Neutro Abs: 7.4
Neutrophils Relative %: 79 — ABNORMAL HIGH

## 2011-06-24 LAB — URINALYSIS, ROUTINE W REFLEX MICROSCOPIC
Bilirubin Urine: NEGATIVE
Ketones, ur: NEGATIVE
Nitrite: NEGATIVE
Urobilinogen, UA: 0.2

## 2011-06-24 LAB — COMPREHENSIVE METABOLIC PANEL
Alkaline Phosphatase: 59
BUN: 12
Chloride: 104
Glucose, Bld: 118 — ABNORMAL HIGH
Potassium: 3.8
Total Bilirubin: 0.6
Total Protein: 6.5

## 2011-06-24 LAB — CBC
HCT: 39
Hemoglobin: 13.5
RDW: 13.2

## 2011-06-24 LAB — APTT: aPTT: 32

## 2011-06-24 LAB — PROTIME-INR
INR: 0.9
Prothrombin Time: 12.4

## 2011-07-11 LAB — BASIC METABOLIC PANEL
BUN: 12
BUN: 12
CO2: 25
Calcium: 9.5
Calcium: 9.6
Creatinine, Ser: 0.57
GFR calc Af Amer: >60
GFR calc non Af Amer: >60
Glucose, Bld: 101 — ABNORMAL HIGH

## 2011-07-11 LAB — PROTIME-INR: INR: 0.9

## 2011-07-11 LAB — CBC
HCT: 40.3
MCHC: 33.9
MCHC: 34.4
Platelets: 187
Platelets: 219
RBC: 4.45
RDW: 12.7
RDW: 12.9

## 2011-07-17 ENCOUNTER — Encounter: Payer: Self-pay | Admitting: Internal Medicine

## 2011-07-17 ENCOUNTER — Other Ambulatory Visit: Payer: Self-pay | Admitting: Internal Medicine

## 2011-07-18 ENCOUNTER — Ambulatory Visit (INDEPENDENT_AMBULATORY_CARE_PROVIDER_SITE_OTHER): Payer: Medicare Other | Admitting: *Deleted

## 2011-07-18 DIAGNOSIS — I442 Atrioventricular block, complete: Secondary | ICD-10-CM

## 2011-07-21 LAB — REMOTE PACEMAKER DEVICE
AL IMPEDENCE PM: 527 Ohm
AL THRESHOLD: 0.375 V
BATTERY VOLTAGE: 2.77 V
RV LEAD IMPEDENCE PM: 559 Ohm
RV LEAD THRESHOLD: 0.5 V

## 2011-07-22 HISTORY — PX: COLONOSCOPY: SHX174

## 2011-07-30 ENCOUNTER — Encounter: Payer: Self-pay | Admitting: *Deleted

## 2011-07-30 NOTE — Progress Notes (Signed)
Pacer remote check  

## 2011-10-10 ENCOUNTER — Encounter: Payer: Self-pay | Admitting: Internal Medicine

## 2011-10-10 ENCOUNTER — Ambulatory Visit (INDEPENDENT_AMBULATORY_CARE_PROVIDER_SITE_OTHER): Payer: Medicare Other | Admitting: Internal Medicine

## 2011-10-10 DIAGNOSIS — Z9889 Other specified postprocedural states: Secondary | ICD-10-CM

## 2011-10-10 DIAGNOSIS — I442 Atrioventricular block, complete: Secondary | ICD-10-CM

## 2011-10-10 DIAGNOSIS — Z950 Presence of cardiac pacemaker: Secondary | ICD-10-CM

## 2011-10-10 DIAGNOSIS — I4949 Other premature depolarization: Secondary | ICD-10-CM

## 2011-10-10 DIAGNOSIS — I10 Essential (primary) hypertension: Secondary | ICD-10-CM | POA: Insufficient documentation

## 2011-10-10 DIAGNOSIS — I493 Ventricular premature depolarization: Secondary | ICD-10-CM | POA: Insufficient documentation

## 2011-10-10 LAB — PACEMAKER DEVICE OBSERVATION: RV LEAD THRESHOLD: 0.5 V

## 2011-10-10 MED ORDER — LOSARTAN POTASSIUM 50 MG PO TABS: 50.0000 mg | ORAL_TABLET | Freq: Every day | ORAL | Status: DC

## 2011-10-10 MED ORDER — CARVEDILOL 6.25 MG PO TABS: 6.2500 mg | ORAL_TABLET | Freq: Two times a day (BID) | ORAL | Status: DC

## 2011-10-10 NOTE — Assessment & Plan Note (Signed)
Symptomatically quiet

## 2011-10-10 NOTE — Assessment & Plan Note (Signed)
The patient's device was interrogated.  The information was reviewed. No changes were made in the programming.    

## 2011-10-10 NOTE — Assessment & Plan Note (Signed)
Well-controlled. We will stop her olmesartan/HCT and begin her on losartan generic.

## 2011-10-10 NOTE — Patient Instructions (Signed)
Your physician has recommended you make the following change in your medication:  1) Stop Benicar/HCT (olmesartan/HCTZ). 2) Start Cozaar (losartan) 50 mg one tablet by mouth daily.  Remote monitoring is used to monitor your Pacemaker of ICD from home. This monitoring reduces the number of office visits required to check your device to one time per year. It allows [place] to keep an eye on the functioning of your device to ensure it is working properly. You are scheduled for a device check from home on 01/09/12. You may send your transmission at any time that day. If you have a wireless device, the transmission will be sent automatically. After your physician reviews your transmission, you will receive a postcard with your next transmission date.  Your physician wants you to follow-up in: 1 year with Dr. Graciela Husbands. You will receive a reminder letter in the mail two months in advance. If you don't receive a letter, please call our office to schedule the follow-up appointment.

## 2011-10-10 NOTE — Progress Notes (Signed)
  HPI  Amanda Bryant is a 71 y.o. female Seen in followup for syncope which turned out to be associated by intermittent complete heart block documented by loop recorder insertion 2008 and subsequently replaced by a dual-chamber pacemaker-Medtronic also in 2008. She's had no recurrent syncope.  The patient denies chest pain, shortness of breath, nocturnal dyspnea, orthopnea or peripheral edema.  There have been no palpitations, lightheadedness or syncope.    Past Medical History  Diagnosis Date  . Complete heart block     intermittent, documented by prev ILR  . PVC's (premature ventricular contractions)   . HTN (hypertension)   . Pacemaker     Implanted 2008  . S/P cardiac catheterization     2001- nonobstructive disease; nl lv function    History reviewed. No pertinent past surgical history.  Current Outpatient Prescriptions  Medication Sig Dispense Refill  . alendronate (FOSAMAX) 70 MG tablet Take 70 mg by mouth every 7 (seven) days. Take with a full glass of water on an empty stomach.      Marland Kitchen aspirin 81 MG tablet Take 160 mg by mouth daily.      . Calcium Carbonate (CALTRATE 600 PO) Take by mouth daily.        . carvedilol (COREG) 6.25 MG tablet Take 1 tablet (6.25 mg total) by mouth 2 (two) times daily with a meal.  180 tablet  3  . Cholecalciferol (VITAMIN D) 1000 UNITS capsule Take 1,000 Units by mouth daily.        . fish oil-omega-3 fatty acids 1000 MG capsule Take 2 g by mouth daily.        Marland Kitchen olmesartan-hydrochlorothiazide (BENICAR HCT) 20-12.5 MG per tablet Take 1 tablet by mouth daily.        Marland Kitchen omeprazole (PRILOSEC) 20 MG capsule Take 20 mg by mouth daily.        . simvastatin (ZOCOR) 20 MG tablet Take 20 mg by mouth at bedtime.        Marland Kitchen venlafaxine (EFFEXOR-XR) 37.5 MG 24 hr capsule Take 37.5 mg by mouth daily.          Allergies  Allergen Reactions  . Codeine   . Prednisone     Review of Systems negative except from HPI and PMH  Physical Exam BP 120/68  Pulse  67  Ht 5' 5.5" (1.664 m)  Wt 144 lb 12.8 oz (65.681 kg)  BMI 23.73 kg/m2 Well developed and well nourished in no acute distress HENT normal E scleral and icterus clear Neck Supple JVP flat; carotids brisk and full Clear to ausculation Regular rate and rhythm, no murmurs gallops or rub Soft with active bowel sounds No clubbing cyanosis none Edema Alert and oriented, grossly normal motor and sensory function Skin Warm and Dry   Assessment and  Plan

## 2011-10-10 NOTE — Assessment & Plan Note (Signed)
Stable post nasal maker insertion with 100% ventricular pacing

## 2011-11-06 ENCOUNTER — Telehealth: Payer: Self-pay | Admitting: Internal Medicine

## 2011-11-06 ENCOUNTER — Other Ambulatory Visit: Payer: Self-pay | Admitting: *Deleted

## 2011-11-06 DIAGNOSIS — I10 Essential (primary) hypertension: Secondary | ICD-10-CM

## 2011-11-06 MED ORDER — LOSARTAN POTASSIUM 50 MG PO TABS: 50.0000 mg | ORAL_TABLET | Freq: Every day | ORAL | Status: DC

## 2011-11-06 NOTE — Telephone Encounter (Signed)
Pt needs refill of cozaar 50mg , mail to [facility]

## 2012-01-09 ENCOUNTER — Ambulatory Visit (INDEPENDENT_AMBULATORY_CARE_PROVIDER_SITE_OTHER): Payer: Medicare Other | Admitting: *Deleted

## 2012-01-09 ENCOUNTER — Encounter: Payer: Self-pay | Admitting: Internal Medicine

## 2012-01-09 DIAGNOSIS — I442 Atrioventricular block, complete: Secondary | ICD-10-CM

## 2012-01-10 LAB — REMOTE PACEMAKER DEVICE
ATRIAL PACING PM: 5
VENTRICULAR PACING PM: 100

## 2012-01-16 NOTE — Progress Notes (Signed)
Remote pacer check  

## 2012-01-28 ENCOUNTER — Encounter: Payer: Self-pay | Admitting: *Deleted

## 2012-04-16 ENCOUNTER — Encounter: Payer: Self-pay | Admitting: Internal Medicine

## 2012-04-16 ENCOUNTER — Ambulatory Visit (INDEPENDENT_AMBULATORY_CARE_PROVIDER_SITE_OTHER): Payer: Medicare Other | Admitting: *Deleted

## 2012-04-16 DIAGNOSIS — I442 Atrioventricular block, complete: Secondary | ICD-10-CM

## 2012-04-27 LAB — REMOTE PACEMAKER DEVICE
AL IMPEDENCE PM: 527 Ohm
ATRIAL PACING PM: 4
RV LEAD THRESHOLD: 0.5 V

## 2012-05-28 ENCOUNTER — Encounter: Payer: Self-pay | Admitting: *Deleted

## 2012-07-27 ENCOUNTER — Ambulatory Visit (INDEPENDENT_AMBULATORY_CARE_PROVIDER_SITE_OTHER): Payer: Medicare Other | Admitting: *Deleted

## 2012-07-27 DIAGNOSIS — I442 Atrioventricular block, complete: Secondary | ICD-10-CM

## 2012-07-27 DIAGNOSIS — Z950 Presence of cardiac pacemaker: Secondary | ICD-10-CM

## 2012-07-29 ENCOUNTER — Encounter: Payer: Self-pay | Admitting: Internal Medicine

## 2012-07-29 DIAGNOSIS — Z950 Presence of cardiac pacemaker: Secondary | ICD-10-CM

## 2012-07-29 DIAGNOSIS — I442 Atrioventricular block, complete: Secondary | ICD-10-CM

## 2012-08-11 LAB — REMOTE PACEMAKER DEVICE
AL THRESHOLD: 0.375 V
BATTERY VOLTAGE: 2.75 V
RV LEAD THRESHOLD: 0.625 V

## 2012-09-07 ENCOUNTER — Encounter: Payer: Self-pay | Admitting: *Deleted

## 2012-10-28 ENCOUNTER — Encounter: Payer: Self-pay | Admitting: Internal Medicine

## 2012-11-03 ENCOUNTER — Telehealth: Payer: Self-pay | Admitting: Internal Medicine

## 2012-11-03 NOTE — Telephone Encounter (Signed)
Pt needs refill losartin to [facility]

## 2012-11-04 ENCOUNTER — Other Ambulatory Visit: Payer: Self-pay | Admitting: *Deleted

## 2012-11-04 DIAGNOSIS — I10 Essential (primary) hypertension: Secondary | ICD-10-CM

## 2012-11-04 MED ORDER — LOSARTAN POTASSIUM 50 MG PO TABS: 50.0000 mg | ORAL_TABLET | Freq: Every day | ORAL | Status: DC

## 2012-12-09 ENCOUNTER — Encounter: Payer: Self-pay | Admitting: Internal Medicine

## 2012-12-09 ENCOUNTER — Ambulatory Visit (INDEPENDENT_AMBULATORY_CARE_PROVIDER_SITE_OTHER): Payer: Medicare Other | Admitting: Internal Medicine

## 2012-12-09 VITALS — BP 149/73 | HR 63 | Ht 65.5 in | Wt 145.0 lb

## 2012-12-09 DIAGNOSIS — I442 Atrioventricular block, complete: Secondary | ICD-10-CM

## 2012-12-09 DIAGNOSIS — Z950 Presence of cardiac pacemaker: Secondary | ICD-10-CM

## 2012-12-09 LAB — PACEMAKER DEVICE OBSERVATION
AL IMPEDENCE PM: 520 Ohm
ATRIAL PACING PM: 5
RV LEAD THRESHOLD: 0.5 V
VENTRICULAR PACING PM: 100

## 2012-12-09 NOTE — Assessment & Plan Note (Signed)
The patient's device was interrogated.  The information was reviewed. No changes were made in the programming.    

## 2012-12-09 NOTE — Patient Instructions (Addendum)
Remote monitoring is used to monitor your Pacemaker of ICD from home. This monitoring reduces the number of office visits required to check your device to one time per year. It allows [place] to keep an eye on the functioning of your device to ensure it is working properly. You are scheduled for a device check from home on 03-15-2013. You may send your transmission at any time that day. If you have a wireless device, the transmission will be sent automatically. After your physician reviews your transmission, you will receive a postcard with your next transmission date.   Your physician wants you to follow-up in: ONE YEAR WITH DR Logan Bores will receive a reminder letter in the mail two months in advance. If you don't receive a letter, please call our office to schedule the follow-up appointment.   STOP SIMVASTATIN FOR 2 MONTHS IF LEG CRAMPS DO NOT IMPROVE START MAGNESIUM OVER THE COUNTER AND RESTART THE SIMVASTATIN-400 MG ONCE DAILY

## 2012-12-09 NOTE — Assessment & Plan Note (Signed)
Stable post pacing 

## 2012-12-09 NOTE — Progress Notes (Signed)
Patient Care Team: Paulina Fusi as PCP - General (Internal Medicine)   HPI  RONALEE SCHEUNEMANN is a 72 y.o. female Seen in followup for syncope which turned out to be associated by intermittent complete heart block documented by loop recorder insertion 2008 and subsequently replaced by a dual-chamber pacemaker-Medtronic also in 2008. She's had no recurrent syncope.    The patient denies chest pain, shortness of breath, nocturnal dyspnea, orthopnea or peripheral edema. There have been no palpitations, lightheadedness or syncope.   She says that she is as needed several. She is riding guidelines. She is doing all of her things. Her biggest complaint is that he has cramps in her legs most nights.   Past Medical History  Diagnosis Date  . Complete heart block     intermittent, documented by prev ILR  . PVC's (premature ventricular contractions)   . HTN (hypertension)   . Pacemaker     Implanted 2008  . S/P cardiac catheterization     2001- nonobstructive disease; nl lv function    No past surgical history on file.  Current Outpatient Prescriptions  Medication Sig Dispense Refill  . alendronate (FOSAMAX) 70 MG tablet Take 70 mg by mouth every 7 (seven) days. Take with a full glass of water on an empty stomach.      Marland Kitchen aspirin 81 MG tablet Take 160 mg by mouth daily.      . Calcium Carbonate (CALTRATE 600 PO) Take by mouth daily.        . carvedilol (COREG) 6.25 MG tablet Take 1 tablet (6.25 mg total) by mouth 2 (two) times daily with a meal.  180 tablet  3  . Cholecalciferol (VITAMIN D) 1000 UNITS capsule Take 1,000 Units by mouth daily.        . fish oil-omega-3 fatty acids 1000 MG capsule Take 2 g by mouth daily.        Marland Kitchen losartan (COZAAR) 50 MG tablet Take 1 tablet (50 mg total) by mouth daily.  90 tablet  1  . omeprazole (PRILOSEC) 20 MG capsule Take 20 mg by mouth daily.        . simvastatin (ZOCOR) 20 MG tablet Take 20 mg by mouth at bedtime.        Marland Kitchen venlafaxine (EFFEXOR-XR)  37.5 MG 24 hr capsule Take 37.5 mg by mouth daily.         No current facility-administered medications for this visit.    Allergies  Allergen Reactions  . Codeine   . Prednisone     Review of Systems negative except from HPI and PMH  Physical Exam BP 149/73  Pulse 63  Ht 5' 5.5" (1.664 m)  Wt 145 lb (65.772 kg)  BMI 23.75 kg/m2 Well developed and well nourished in no acute distress HENT normal E scleral and icterus clear Neck Supple JVP flat; carotids brisk and full Clear to ausculation Device pocket well healed; without hematoma or erythema   Regular rate and rhythm, no murmurs gallops or rub Soft with active bowel sounds No clubbing cyanosis none Edema Alert and oriented, grossly normal motor and sensory function Skin Warm and Dry  ECG  P-synchronous/ AV  pacing   Assessment and  Plan

## 2012-12-14 ENCOUNTER — Telehealth: Payer: Self-pay | Admitting: Internal Medicine

## 2012-12-14 DIAGNOSIS — I442 Atrioventricular block, complete: Secondary | ICD-10-CM

## 2012-12-14 DIAGNOSIS — I493 Ventricular premature depolarization: Secondary | ICD-10-CM

## 2012-12-14 DIAGNOSIS — I10 Essential (primary) hypertension: Secondary | ICD-10-CM

## 2012-12-14 MED ORDER — CARVEDILOL 6.25 MG PO TABS: 6.2500 mg | ORAL_TABLET | Freq: Two times a day (BID) | ORAL | Status: DC

## 2012-12-14 NOTE — Telephone Encounter (Signed)
Refill completed. Pt notified

## 2012-12-14 NOTE — Telephone Encounter (Signed)
New problem   Pt need new prescription for Carvedilal 6.25mg  please call in to  [facility] @ [phone].

## 2012-12-23 ENCOUNTER — Telehealth: Payer: Self-pay | Admitting: Internal Medicine

## 2012-12-23 NOTE — Telephone Encounter (Signed)
DOD Dr. [place] reviewed last ov (12/09/12) with Dr. Graciela Husbands. Cleared pt for surgery on Friday Mylo Red RN

## 2012-12-23 NOTE — Telephone Encounter (Signed)
New Problem:   Called in because the patient has an unhealing would and Dr. Ernesta Amble would like to perform surgery on her this Friday 12/25/12 and needs a surgical clearance form our office.  Please call back.

## 2013-03-15 ENCOUNTER — Ambulatory Visit (INDEPENDENT_AMBULATORY_CARE_PROVIDER_SITE_OTHER): Payer: Medicare Other | Admitting: *Deleted

## 2013-03-15 ENCOUNTER — Encounter: Payer: Self-pay | Admitting: Internal Medicine

## 2013-03-15 DIAGNOSIS — Z950 Presence of cardiac pacemaker: Secondary | ICD-10-CM

## 2013-03-15 DIAGNOSIS — I442 Atrioventricular block, complete: Secondary | ICD-10-CM

## 2013-03-21 LAB — REMOTE PACEMAKER DEVICE
AL THRESHOLD: 0.375 V
RV LEAD THRESHOLD: 0.625 V

## 2013-03-29 ENCOUNTER — Encounter: Payer: Self-pay | Admitting: *Deleted

## 2013-04-06 ENCOUNTER — Other Ambulatory Visit: Payer: Self-pay | Admitting: *Deleted

## 2013-04-06 DIAGNOSIS — I10 Essential (primary) hypertension: Secondary | ICD-10-CM

## 2013-04-06 MED ORDER — LOSARTAN POTASSIUM 50 MG PO TABS: 50.0000 mg | ORAL_TABLET | Freq: Every day | ORAL | Status: DC

## 2013-04-15 ENCOUNTER — Encounter: Payer: Self-pay | Admitting: Internal Medicine

## 2013-06-28 ENCOUNTER — Ambulatory Visit (INDEPENDENT_AMBULATORY_CARE_PROVIDER_SITE_OTHER): Payer: Medicare Other | Admitting: *Deleted

## 2013-06-28 DIAGNOSIS — I442 Atrioventricular block, complete: Secondary | ICD-10-CM

## 2013-06-28 DIAGNOSIS — I493 Ventricular premature depolarization: Secondary | ICD-10-CM

## 2013-06-28 DIAGNOSIS — I4949 Other premature depolarization: Secondary | ICD-10-CM

## 2013-06-29 LAB — REMOTE PACEMAKER DEVICE
AL THRESHOLD: 0.5 V
RV LEAD IMPEDENCE PM: 646 Ohm
RV LEAD THRESHOLD: 0.5 V

## 2013-07-22 ENCOUNTER — Encounter: Payer: Self-pay | Admitting: Internal Medicine

## 2013-07-26 ENCOUNTER — Encounter: Payer: Self-pay | Admitting: Internal Medicine

## 2013-08-06 ENCOUNTER — Encounter: Payer: Self-pay | Admitting: Internal Medicine

## 2013-09-07 ENCOUNTER — Other Ambulatory Visit: Payer: Self-pay

## 2013-09-07 DIAGNOSIS — I10 Essential (primary) hypertension: Secondary | ICD-10-CM

## 2013-09-07 MED ORDER — LOSARTAN POTASSIUM 50 MG PO TABS: 50.0000 mg | ORAL_TABLET | Freq: Every day | ORAL | Status: DC

## 2013-10-01 ENCOUNTER — Encounter: Payer: Medicare Other | Admitting: *Deleted

## 2013-10-07 ENCOUNTER — Encounter: Payer: Self-pay | Admitting: *Deleted

## 2013-10-11 ENCOUNTER — Ambulatory Visit (INDEPENDENT_AMBULATORY_CARE_PROVIDER_SITE_OTHER): Payer: Managed Care, Other (non HMO) | Admitting: *Deleted

## 2013-10-11 DIAGNOSIS — I442 Atrioventricular block, complete: Secondary | ICD-10-CM

## 2013-10-11 DIAGNOSIS — Z950 Presence of cardiac pacemaker: Secondary | ICD-10-CM

## 2013-10-13 LAB — MDC_IDC_ENUM_SESS_TYPE_REMOTE
Battery Impedance: 1670 Ohm
Battery Remaining Longevity: 28 mo
Brady Statistic AP VP Percent: 8 %
Brady Statistic AP VS Percent: 0 %
Brady Statistic AS VP Percent: 92 %
Date Time Interrogation Session: 20150112234438
Lead Channel Impedance Value: 581 Ohm
Lead Channel Impedance Value: 707 Ohm
Lead Channel Pacing Threshold Amplitude: 0.5 V
Lead Channel Pacing Threshold Amplitude: 0.625 V
Lead Channel Setting Pacing Amplitude: 2.5 V
Lead Channel Setting Pacing Pulse Width: 0.4 ms
MDC IDC MSMT BATTERY VOLTAGE: 2.73 V
MDC IDC MSMT LEADCHNL RA PACING THRESHOLD PULSEWIDTH: 0.4 ms
MDC IDC MSMT LEADCHNL RV PACING THRESHOLD PULSEWIDTH: 0.4 ms
MDC IDC SET LEADCHNL RA PACING AMPLITUDE: 2 V
MDC IDC SET LEADCHNL RV SENSING SENSITIVITY: 4 mV
MDC IDC STAT BRADY AS VS PERCENT: 0 %

## 2013-11-03 ENCOUNTER — Encounter: Payer: Self-pay | Admitting: *Deleted

## 2013-11-08 ENCOUNTER — Encounter: Payer: Self-pay | Admitting: Internal Medicine

## 2013-12-16 ENCOUNTER — Encounter: Payer: Self-pay | Admitting: Internal Medicine

## 2013-12-16 ENCOUNTER — Ambulatory Visit (INDEPENDENT_AMBULATORY_CARE_PROVIDER_SITE_OTHER): Payer: Managed Care, Other (non HMO) | Admitting: Internal Medicine

## 2013-12-16 VITALS — BP 132/65 | HR 62 | Ht 65.5 in | Wt 148.0 lb

## 2013-12-16 DIAGNOSIS — Z950 Presence of cardiac pacemaker: Secondary | ICD-10-CM

## 2013-12-16 DIAGNOSIS — I442 Atrioventricular block, complete: Secondary | ICD-10-CM

## 2013-12-16 LAB — MDC_IDC_ENUM_SESS_TYPE_INCLINIC
Battery Impedance: 1878 Ohm
Battery Remaining Longevity: 25 mo
Battery Voltage: 2.72 V
Brady Statistic AP VS Percent: 0 %
Lead Channel Impedance Value: 631 Ohm
Lead Channel Pacing Threshold Amplitude: 0.5 V
Lead Channel Pacing Threshold Pulse Width: 0.4 ms
Lead Channel Setting Pacing Amplitude: 2 V
Lead Channel Setting Pacing Amplitude: 2.5 V
Lead Channel Setting Pacing Pulse Width: 0.4 ms
MDC IDC MSMT LEADCHNL RA IMPEDANCE VALUE: 542 Ohm
MDC IDC MSMT LEADCHNL RA PACING THRESHOLD PULSEWIDTH: 0.4 ms
MDC IDC MSMT LEADCHNL RA SENSING INTR AMPL: 2 mV
MDC IDC MSMT LEADCHNL RV PACING THRESHOLD AMPLITUDE: 0.75 V
MDC IDC SESS DTM: 20150319123841
MDC IDC SET LEADCHNL RV SENSING SENSITIVITY: 4 mV
MDC IDC STAT BRADY AP VP PERCENT: 8 %
MDC IDC STAT BRADY AS VP PERCENT: 92 %
MDC IDC STAT BRADY AS VS PERCENT: 0 %

## 2013-12-16 NOTE — Progress Notes (Signed)
      Patient Care Team: Nicoletta Dress, MD as PCP - General (Internal Medicine)   HPI  Amanda Bryant is a 73 y.o. female Seen in followup for syncope which turned out to be associated by intermittent complete heart block documented by loop recorder insertion 2008 and subsequently replaced by a dual-chamber pacemaker-Medtronic also in 2008. She's had no recurrent syncope.  The patient denies chest pain, shortness of breath, nocturnal dyspnea, orthopnea or peripheral edema. There have been no palpitations, lightheadedness or syncope.  She continues to smoke altough she is trying to stop   Past Medical History  Diagnosis Date  . Complete heart block     intermittent, documented by prev ILR  . PVC's (premature ventricular contractions)   . HTN (hypertension)   . Pacemaker     Implanted 2008  . S/P cardiac catheterization     2001- nonobstructive disease; nl lv function    No past surgical history on file.  Current Outpatient Prescriptions  Medication Sig Dispense Refill  . alendronate (FOSAMAX) 70 MG tablet Take 70 mg by mouth every 7 (seven) days. Take with a full glass of water on an empty stomach.      Marland Kitchen aspirin 81 MG tablet Take 160 mg by mouth daily.      . Calcium Carbonate (CALTRATE 600 PO) Take by mouth daily.        . carvedilol (COREG) 6.25 MG tablet Take 1 tablet (6.25 mg total) by mouth 2 (two) times daily with a meal.  180 tablet  3  . Cholecalciferol (VITAMIN D) 1000 UNITS capsule Take 1,000 Units by mouth daily.        . fish oil-omega-3 fatty acids 1000 MG capsule Take 2 g by mouth daily.        Marland Kitchen losartan (COZAAR) 50 MG tablet Take 1 tablet (50 mg total) by mouth daily.  90 tablet  1  . omeprazole (PRILOSEC) 20 MG capsule Take 20 mg by mouth daily.        Marland Kitchen venlafaxine (EFFEXOR-XR) 37.5 MG 24 hr capsule Take 37.5 mg by mouth daily.        No current facility-administered medications for this visit.    Allergies  Allergen Reactions  . Codeine   .  Prednisone     Review of Systems negative except from HPI and PMH  Physical Exam BP 132/65  Pulse 62  Ht 5' 5.5" (1.664 m)  Wt 148 lb (67.132 kg)  BMI 24.25 kg/m2 Well developed and well nourished in no acute distress HENT normal E scleral and icterus clear Neck Supple JVP flat; carotids brisk and full Clear to ausculation  Regular rate and rhythm,2/6 murmur Soft with active bowel sounds No clubbing cyanosis  Edema Alert and oriented, grossly normal motor and sensory function Skin Warm and Dry  ECG demonstrates P. synchronous pacing  Assessment and  Plan  Complete heart block  Pacemaker-Medtronic The patient's device was interrogated.  The information was reviewed. No changes were made in the programming.    Hypertension  Syncope  Cigarette abuse  She continues to smoke. Encouraged her again to stop. Blood pressure is well-controlled. No recurrent syncope.

## 2013-12-16 NOTE — Patient Instructions (Signed)
Your physician recommends that you continue on your current medications as directed. Please refer to the Current Medication list given to you today.  Remote monitoring is used to monitor your Pacemaker of ICD from home. This monitoring reduces the number of office visits required to check your device to one time per year. It allows us to keep an eye on the functioning of your device to ensure it is working properly. You are scheduled for a device check from home on 03/21/14. You may send your transmission at any time that day. If you have a wireless device, the transmission will be sent automatically. After your physician reviews your transmission, you will receive a postcard with your next transmission date.  Your physician wants you to follow-up in: 1 year with Dr. Klein.  You will receive a reminder letter in the mail two months in advance. If you don't receive a letter, please call our office to schedule the follow-up appointment.  

## 2014-03-21 ENCOUNTER — Ambulatory Visit (INDEPENDENT_AMBULATORY_CARE_PROVIDER_SITE_OTHER): Payer: Managed Care, Other (non HMO) | Admitting: *Deleted

## 2014-03-21 ENCOUNTER — Telehealth: Payer: Self-pay | Admitting: Cardiology

## 2014-03-21 DIAGNOSIS — Z950 Presence of cardiac pacemaker: Secondary | ICD-10-CM

## 2014-03-21 DIAGNOSIS — I442 Atrioventricular block, complete: Secondary | ICD-10-CM

## 2014-03-21 NOTE — Telephone Encounter (Signed)
Spoke with pt and reminded pt of remote transmission that is due today. Pt verbalized understanding.   

## 2014-03-21 NOTE — Progress Notes (Signed)
Remote pacemaker transmission.   

## 2014-03-28 LAB — MDC_IDC_ENUM_SESS_TYPE_REMOTE
Battery Remaining Longevity: 22 mo
Brady Statistic AS VS Percent: 0 %
Date Time Interrogation Session: 20150622155530
Lead Channel Impedance Value: 668 Ohm
Lead Channel Pacing Threshold Amplitude: 0.625 V
Lead Channel Pacing Threshold Pulse Width: 0.4 ms
Lead Channel Pacing Threshold Pulse Width: 0.4 ms
Lead Channel Setting Pacing Amplitude: 2 V
Lead Channel Setting Pacing Amplitude: 2.5 V
Lead Channel Setting Sensing Sensitivity: 4 mV
MDC IDC MSMT BATTERY IMPEDANCE: 2073 Ohm
MDC IDC MSMT BATTERY VOLTAGE: 2.72 V
MDC IDC MSMT LEADCHNL RA IMPEDANCE VALUE: 550 Ohm
MDC IDC MSMT LEADCHNL RA PACING THRESHOLD AMPLITUDE: 0.5 V
MDC IDC SET LEADCHNL RV PACING PULSEWIDTH: 0.4 ms
MDC IDC STAT BRADY AP VP PERCENT: 10 %
MDC IDC STAT BRADY AP VS PERCENT: 0 %
MDC IDC STAT BRADY AS VP PERCENT: 90 %

## 2014-04-05 ENCOUNTER — Encounter: Payer: Self-pay | Admitting: Cardiology

## 2014-04-12 ENCOUNTER — Encounter: Payer: Self-pay | Admitting: Internal Medicine

## 2014-06-16 ENCOUNTER — Other Ambulatory Visit: Payer: Self-pay | Admitting: Orthopedic Surgery

## 2014-06-16 DIAGNOSIS — M25511 Pain in right shoulder: Secondary | ICD-10-CM

## 2014-06-22 ENCOUNTER — Inpatient Hospital Stay: Admission: RE | Admit: 2014-06-22 | Payer: Managed Care, Other (non HMO) | Source: Ambulatory Visit

## 2014-06-22 ENCOUNTER — Other Ambulatory Visit: Payer: Managed Care, Other (non HMO)

## 2014-06-22 ENCOUNTER — Ambulatory Visit (INDEPENDENT_AMBULATORY_CARE_PROVIDER_SITE_OTHER): Payer: Managed Care, Other (non HMO) | Admitting: *Deleted

## 2014-06-22 DIAGNOSIS — I442 Atrioventricular block, complete: Secondary | ICD-10-CM

## 2014-06-22 NOTE — Progress Notes (Signed)
Remote pacemaker transmission.   

## 2014-06-24 LAB — MDC_IDC_ENUM_SESS_TYPE_REMOTE
Battery Impedance: 2145 Ohm
Battery Voltage: 2.7 V
Brady Statistic AP VP Percent: 9 %
Brady Statistic AP VS Percent: 0 %
Brady Statistic AS VP Percent: 91 %
Brady Statistic AS VS Percent: 0 %
Lead Channel Impedance Value: 574 Ohm
Lead Channel Pacing Threshold Amplitude: 0.375 V
Lead Channel Pacing Threshold Pulse Width: 0.4 ms
Lead Channel Pacing Threshold Pulse Width: 0.4 ms
Lead Channel Setting Pacing Amplitude: 2 V
Lead Channel Setting Pacing Pulse Width: 0.4 ms
MDC IDC MSMT BATTERY REMAINING LONGEVITY: 22 mo
MDC IDC MSMT LEADCHNL RV IMPEDANCE VALUE: 680 Ohm
MDC IDC MSMT LEADCHNL RV PACING THRESHOLD AMPLITUDE: 0.625 V
MDC IDC SESS DTM: 20150923104856
MDC IDC SET LEADCHNL RV PACING AMPLITUDE: 2.5 V
MDC IDC SET LEADCHNL RV SENSING SENSITIVITY: 4 mV

## 2014-07-04 ENCOUNTER — Encounter: Payer: Self-pay | Admitting: Cardiology

## 2014-07-08 ENCOUNTER — Encounter: Payer: Self-pay | Admitting: Internal Medicine

## 2014-09-26 ENCOUNTER — Ambulatory Visit (INDEPENDENT_AMBULATORY_CARE_PROVIDER_SITE_OTHER): Payer: Managed Care, Other (non HMO) | Admitting: *Deleted

## 2014-09-26 DIAGNOSIS — I442 Atrioventricular block, complete: Secondary | ICD-10-CM

## 2014-09-26 LAB — MDC_IDC_ENUM_SESS_TYPE_REMOTE
Battery Remaining Longevity: 21 mo
Battery Voltage: 2.71 V
Brady Statistic AP VP Percent: 9 %
Brady Statistic AS VP Percent: 91 %
Lead Channel Impedance Value: 525 Ohm
Lead Channel Pacing Threshold Pulse Width: 0.4 ms
Lead Channel Setting Pacing Amplitude: 2 V
Lead Channel Setting Pacing Amplitude: 2.5 V
Lead Channel Setting Pacing Pulse Width: 0.4 ms
Lead Channel Setting Sensing Sensitivity: 4 mV
MDC IDC MSMT BATTERY IMPEDANCE: 2208 Ohm
MDC IDC MSMT LEADCHNL RA PACING THRESHOLD AMPLITUDE: 0.375 V
MDC IDC MSMT LEADCHNL RA PACING THRESHOLD PULSEWIDTH: 0.4 ms
MDC IDC MSMT LEADCHNL RV IMPEDANCE VALUE: 642 Ohm
MDC IDC MSMT LEADCHNL RV PACING THRESHOLD AMPLITUDE: 0.625 V
MDC IDC SESS DTM: 20151228124053
MDC IDC STAT BRADY AP VS PERCENT: 0 %
MDC IDC STAT BRADY AS VS PERCENT: 0 %

## 2014-09-26 NOTE — Progress Notes (Signed)
Remote pacemaker check. 

## 2014-10-14 ENCOUNTER — Encounter: Payer: Self-pay | Admitting: Cardiology

## 2014-10-19 ENCOUNTER — Encounter: Payer: Self-pay | Admitting: Internal Medicine

## 2014-12-21 ENCOUNTER — Encounter: Payer: Self-pay | Admitting: Internal Medicine

## 2014-12-21 ENCOUNTER — Ambulatory Visit (INDEPENDENT_AMBULATORY_CARE_PROVIDER_SITE_OTHER): Payer: PPO | Admitting: Internal Medicine

## 2014-12-21 VITALS — BP 132/82 | HR 61 | Ht 65.5 in | Wt 156.0 lb

## 2014-12-21 DIAGNOSIS — Z950 Presence of cardiac pacemaker: Secondary | ICD-10-CM | POA: Diagnosis not present

## 2014-12-21 DIAGNOSIS — I442 Atrioventricular block, complete: Secondary | ICD-10-CM

## 2014-12-21 DIAGNOSIS — Z45018 Encounter for adjustment and management of other part of cardiac pacemaker: Secondary | ICD-10-CM

## 2014-12-21 DIAGNOSIS — I10 Essential (primary) hypertension: Secondary | ICD-10-CM | POA: Diagnosis not present

## 2014-12-21 LAB — MDC_IDC_ENUM_SESS_TYPE_INCLINIC
Brady Statistic AS VP Percent: 91 %
Date Time Interrogation Session: 20160323113641
Lead Channel Impedance Value: 524 Ohm
Lead Channel Impedance Value: 606 Ohm
Lead Channel Pacing Threshold Amplitude: 0.5 V
Lead Channel Pacing Threshold Pulse Width: 0.4 ms
Lead Channel Pacing Threshold Pulse Width: 0.4 ms
Lead Channel Setting Pacing Pulse Width: 0.4 ms
MDC IDC MSMT BATTERY IMPEDANCE: 2582 Ohm
MDC IDC MSMT BATTERY REMAINING LONGEVITY: 18 mo
MDC IDC MSMT BATTERY VOLTAGE: 2.71 V
MDC IDC MSMT LEADCHNL RA SENSING INTR AMPL: 2 mV
MDC IDC MSMT LEADCHNL RV PACING THRESHOLD AMPLITUDE: 0.75 V
MDC IDC SET LEADCHNL RA PACING AMPLITUDE: 2 V
MDC IDC SET LEADCHNL RV PACING AMPLITUDE: 2.5 V
MDC IDC SET LEADCHNL RV SENSING SENSITIVITY: 4 mV
MDC IDC STAT BRADY AP VP PERCENT: 9 %
MDC IDC STAT BRADY AP VS PERCENT: 0 %
MDC IDC STAT BRADY AS VS PERCENT: 0 %

## 2014-12-21 NOTE — Progress Notes (Signed)
      Patient Care Team: Nicoletta Dress, MD as PCP - General (Internal Medicine)   HPI  Amanda Bryant is a 74 y.o. female Seen in followup for syncope which turned out to be associated by intermittent complete heart block documented by loop recorder insertion 2008 and subsequently replaced by a dual-chamber pacemaker-Medtronic also in 2008. She's had no recurrent syncope.  The patient denies chest pain, shortness of breath, nocturnal dyspnea, orthopnea or peripheral edema. There have been no palpitations, lightheadedness or syncope.  She continues to smoke altough she is trying to stop   Past Medical History  Diagnosis Date  . Complete heart block     intermittent, documented by prev ILR  . PVC's (premature ventricular contractions)   . HTN (hypertension)   . Pacemaker     Implanted 2008  . S/P cardiac catheterization     2001- nonobstructive disease; nl lv function    No past surgical history on file.  Current Outpatient Prescriptions  Medication Sig Dispense Refill  . alendronate (FOSAMAX) 70 MG tablet Take 70 mg by mouth every 7 (seven) days. Take with a full glass of water on an empty stomach.    Marland Kitchen aspirin 81 MG tablet Take 160 mg by mouth daily.    . Calcium Carbonate (CALTRATE 600 PO) Take by mouth daily.      . Cholecalciferol (VITAMIN D) 1000 UNITS capsule Take 1,000 Units by mouth daily.      . fish oil-omega-3 fatty acids 1000 MG capsule Take 2 g by mouth daily.      Marland Kitchen omeprazole (PRILOSEC) 20 MG capsule Take 20 mg by mouth daily.      Marland Kitchen venlafaxine (EFFEXOR-XR) 37.5 MG 24 hr capsule Take 37.5 mg by mouth daily.     . carvedilol (COREG) 6.25 MG tablet Take 1 tablet (6.25 mg total) by mouth 2 (two) times daily with a meal. 180 tablet 3  . losartan (COZAAR) 50 MG tablet Take 1 tablet (50 mg total) by mouth daily. 90 tablet 1   No current facility-administered medications for this visit.    Allergies  Allergen Reactions  . Codeine   . Prednisone     Review  of Systems negative except from HPI and PMH  Physical Exam BP 132/82 mmHg  Pulse 61  Ht 5' 5.5" (1.664 m)  Wt 156 lb (70.761 kg)  BMI 25.56 kg/m2 Well developed and well nourished in no acute distress HENT normal E scleral and icterus clear Neck Supple JVP flat; carotids brisk and full Clear to ausculation  Regular rate and rhythm,2/6 murmur Soft with active bowel sounds No clubbing cyanosis  Edema Alert and oriented, grossly normal motor and sensory function Skin Warm and Dry  ECG demonstrates P. synchronous pacing  Assessment and  Plan  Complete heart block  Stable   Pacemaker-Medtronic The patient's device was interrogated.  The information was reviewed. No changes were made in the programming.    Hypertension well controlled  Syncope  Cigarette abuse  She continues to smoke. Encouraged her again to stop. Blood pressure is well-controlled. No recurrent syncope.

## 2014-12-21 NOTE — Patient Instructions (Signed)

## 2014-12-28 ENCOUNTER — Ambulatory Visit
Admission: RE | Admit: 2014-12-28 | Discharge: 2014-12-28 | Disposition: A | Discharge status: Home / Self Care | Payer: PPO | Source: Ambulatory Visit | Attending: Orthopedic Surgery | Admitting: Orthopedic Surgery

## 2014-12-28 DIAGNOSIS — M25511 Pain in right shoulder: Secondary | ICD-10-CM

## 2014-12-28 MED ORDER — IOHEXOL 180 MG/ML  SOLN
15.0000 mL | Freq: Once | INTRAMUSCULAR | Status: AC | PRN
  Administered 2014-12-28: 15 mL via INTRA_ARTICULAR

## 2015-03-22 ENCOUNTER — Telehealth: Payer: Self-pay | Admitting: Cardiology

## 2015-03-22 ENCOUNTER — Encounter: Payer: PPO | Admitting: *Deleted

## 2015-03-22 NOTE — Telephone Encounter (Signed)
Spoke with pt and reminded pt of remote transmission that is due today. Pt verbalized understanding.   

## 2015-03-23 ENCOUNTER — Encounter: Payer: Self-pay | Admitting: Cardiology

## 2015-03-29 ENCOUNTER — Telehealth: Payer: Self-pay | Admitting: Internal Medicine

## 2015-03-29 NOTE — Telephone Encounter (Signed)
New Message   1. Has your device fired?n  2. Is you device beeping?n  3. Are you experiencing draining or swelling at device site? n  4. Are you calling to see if we received your device transmission? yes  5. Have you passed out? n

## 2015-03-29 NOTE — Telephone Encounter (Signed)
LMOVM informing pt that transmission has not been received.

## 2015-04-04 ENCOUNTER — Telehealth: Payer: Self-pay | Admitting: Internal Medicine

## 2015-04-04 NOTE — Telephone Encounter (Signed)
New message     Pt calling to see if we received her transmission.  Please advise If no answer at home, please call cell.

## 2015-04-04 NOTE — Telephone Encounter (Signed)
Spoke w/ pt and informed her that we probably haven't received a transmission from her home monitor b/c she needs a wirex adapter. wirex adapter ordered. Explained to pt how the adapter works and that she should receive it in 6-8 weeks. Pt verbalized understanding.

## 2015-04-10 ENCOUNTER — Ambulatory Visit (INDEPENDENT_AMBULATORY_CARE_PROVIDER_SITE_OTHER): Payer: PPO | Admitting: *Deleted

## 2015-04-10 DIAGNOSIS — I442 Atrioventricular block, complete: Secondary | ICD-10-CM | POA: Diagnosis not present

## 2015-04-12 NOTE — Progress Notes (Signed)
Remote pacemaker transmission.   

## 2015-04-20 LAB — CUP PACEART REMOTE DEVICE CHECK
Battery Remaining Longevity: 15 mo
Battery Voltage: 2.71 V
Date Time Interrogation Session: 20160711180921
Lead Channel Impedance Value: 555 Ohm
Lead Channel Impedance Value: 655 Ohm
Lead Channel Pacing Threshold Amplitude: 0.5 V
Lead Channel Pacing Threshold Amplitude: 0.625 V
Lead Channel Pacing Threshold Pulse Width: 0.4 ms
Lead Channel Setting Pacing Amplitude: 2 V
Lead Channel Setting Pacing Pulse Width: 0.4 ms
MDC IDC MSMT BATTERY IMPEDANCE: 3006 Ohm
MDC IDC MSMT LEADCHNL RV PACING THRESHOLD PULSEWIDTH: 0.4 ms
MDC IDC SET LEADCHNL RV PACING AMPLITUDE: 2.5 V
MDC IDC SET LEADCHNL RV SENSING SENSITIVITY: 4 mV
MDC IDC STAT BRADY AP VP PERCENT: 12 %
MDC IDC STAT BRADY AP VS PERCENT: 0 %
MDC IDC STAT BRADY AS VP PERCENT: 88 %
MDC IDC STAT BRADY AS VS PERCENT: 0 %

## 2015-04-26 HISTORY — PX: ERCP: SHX60

## 2015-05-10 ENCOUNTER — Encounter: Payer: Self-pay | Admitting: Cardiology

## 2015-05-17 ENCOUNTER — Encounter: Payer: Self-pay | Admitting: Internal Medicine

## 2015-07-12 ENCOUNTER — Telehealth: Payer: Self-pay | Admitting: Cardiology

## 2015-07-12 ENCOUNTER — Ambulatory Visit (INDEPENDENT_AMBULATORY_CARE_PROVIDER_SITE_OTHER): Payer: PPO | Admitting: *Deleted

## 2015-07-12 DIAGNOSIS — I442 Atrioventricular block, complete: Secondary | ICD-10-CM | POA: Diagnosis not present

## 2015-07-12 NOTE — Telephone Encounter (Signed)
LMOVM reminding pt to send remote transmission.   

## 2015-07-13 NOTE — Progress Notes (Signed)
Remote pacemaker transmission.   

## 2015-07-14 LAB — CUP PACEART REMOTE DEVICE CHECK
Battery Remaining Longevity: 13 mo
Brady Statistic AP VS Percent: 0 %
Brady Statistic AS VP Percent: 88 %
Brady Statistic AS VS Percent: 0 %
Implantable Lead Implant Date: 20081014
Implantable Lead Implant Date: 20081014
Implantable Lead Location: 753860
Lead Channel Impedance Value: 733 Ohm
Lead Channel Pacing Threshold Amplitude: 0.375 V
Lead Channel Pacing Threshold Amplitude: 0.625 V
Lead Channel Pacing Threshold Pulse Width: 0.4 ms
Lead Channel Pacing Threshold Pulse Width: 0.4 ms
Lead Channel Setting Pacing Amplitude: 2 V
Lead Channel Setting Pacing Amplitude: 2.5 V
Lead Channel Setting Sensing Sensitivity: 4 mV
MDC IDC LEAD LOCATION: 753859
MDC IDC MSMT BATTERY IMPEDANCE: 3348 Ohm
MDC IDC MSMT BATTERY VOLTAGE: 2.68 V
MDC IDC MSMT LEADCHNL RA IMPEDANCE VALUE: 609 Ohm
MDC IDC SESS DTM: 20161012185227
MDC IDC SET LEADCHNL RV PACING PULSEWIDTH: 0.4 ms
MDC IDC STAT BRADY AP VP PERCENT: 12 %

## 2015-07-18 ENCOUNTER — Encounter: Payer: Self-pay | Admitting: Cardiology

## 2015-08-28 ENCOUNTER — Telehealth: Payer: Self-pay | Admitting: Internal Medicine

## 2015-08-28 NOTE — Telephone Encounter (Signed)
I spoke with the patient. She reports that she had an episode of flushing and lightheadedness that occurred about 12:30 pm today.  This lasted about a minute, but she still feels a little dizzy. She will send a transmission on her device to confirm no abnormalities. She has not checked her BP. She does report that she had a stomach virus over the weekend and had diarrhea with this. I advised her that her electrolytes may also be slightly out of balance though she states she was staying hydrated. Will await remote transmission and call her back. She is agreeable.

## 2015-08-28 NOTE — Telephone Encounter (Signed)
New Message    Pt calling stating that she got hot all over and felt like she was going to pass out. Pt wants to know if she needs to do a device transmission or something else. Please call back and advise.

## 2015-08-28 NOTE — Telephone Encounter (Signed)
Transmission received. Reviewed by device clinic- no episodes noted. I have advised the patient that her transmission looked good. She did check her BP and it is 112/60. I have advised her to stay hydrated, that this may be related to her virus from the weekend. She is grateful for the call back.

## 2015-10-11 ENCOUNTER — Ambulatory Visit (INDEPENDENT_AMBULATORY_CARE_PROVIDER_SITE_OTHER): Payer: PPO | Admitting: *Deleted

## 2015-10-11 DIAGNOSIS — I442 Atrioventricular block, complete: Secondary | ICD-10-CM | POA: Diagnosis not present

## 2015-10-11 NOTE — Progress Notes (Signed)
Remote pacemaker transmission.   

## 2015-10-16 DIAGNOSIS — F418 Other specified anxiety disorders: Secondary | ICD-10-CM | POA: Diagnosis not present

## 2015-10-16 DIAGNOSIS — M810 Age-related osteoporosis without current pathological fracture: Secondary | ICD-10-CM | POA: Diagnosis not present

## 2015-10-16 DIAGNOSIS — Z23 Encounter for immunization: Secondary | ICD-10-CM | POA: Diagnosis not present

## 2015-10-16 DIAGNOSIS — E559 Vitamin D deficiency, unspecified: Secondary | ICD-10-CM | POA: Diagnosis not present

## 2015-10-16 DIAGNOSIS — Z1231 Encounter for screening mammogram for malignant neoplasm of breast: Secondary | ICD-10-CM | POA: Diagnosis not present

## 2015-10-16 DIAGNOSIS — R7301 Impaired fasting glucose: Secondary | ICD-10-CM | POA: Diagnosis not present

## 2015-10-16 DIAGNOSIS — Z6826 Body mass index (BMI) 26.0-26.9, adult: Secondary | ICD-10-CM | POA: Diagnosis not present

## 2015-10-17 DIAGNOSIS — E785 Hyperlipidemia, unspecified: Secondary | ICD-10-CM | POA: Diagnosis not present

## 2015-10-26 DIAGNOSIS — C44519 Basal cell carcinoma of skin of other part of trunk: Secondary | ICD-10-CM | POA: Diagnosis not present

## 2015-10-26 DIAGNOSIS — C44310 Basal cell carcinoma of skin of unspecified parts of face: Secondary | ICD-10-CM | POA: Diagnosis not present

## 2015-10-27 DIAGNOSIS — M7541 Impingement syndrome of right shoulder: Secondary | ICD-10-CM | POA: Diagnosis not present

## 2015-10-27 DIAGNOSIS — M25511 Pain in right shoulder: Secondary | ICD-10-CM | POA: Diagnosis not present

## 2015-10-30 LAB — CUP PACEART REMOTE DEVICE CHECK
Battery Impedance: 3737 Ohm
Battery Remaining Longevity: 11 mo
Brady Statistic AP VP Percent: 11 %
Brady Statistic AP VS Percent: 0 %
Brady Statistic AS VP Percent: 89 %
Date Time Interrogation Session: 20170111161644
Implantable Lead Location: 753860
Implantable Lead Model: 5076
Implantable Lead Model: 5076
Lead Channel Pacing Threshold Amplitude: 0.375 V
Lead Channel Pacing Threshold Pulse Width: 0.4 ms
Lead Channel Pacing Threshold Pulse Width: 0.4 ms
Lead Channel Setting Pacing Amplitude: 2.5 V
MDC IDC LEAD IMPLANT DT: 20081014
MDC IDC LEAD IMPLANT DT: 20081014
MDC IDC LEAD LOCATION: 753859
MDC IDC MSMT BATTERY VOLTAGE: 2.68 V
MDC IDC MSMT LEADCHNL RA IMPEDANCE VALUE: 555 Ohm
MDC IDC MSMT LEADCHNL RV IMPEDANCE VALUE: 609 Ohm
MDC IDC MSMT LEADCHNL RV PACING THRESHOLD AMPLITUDE: 0.625 V
MDC IDC SET LEADCHNL RA PACING AMPLITUDE: 2 V
MDC IDC SET LEADCHNL RV PACING PULSEWIDTH: 0.4 ms
MDC IDC SET LEADCHNL RV SENSING SENSITIVITY: 4 mV
MDC IDC STAT BRADY AS VS PERCENT: 0 %

## 2015-11-01 ENCOUNTER — Encounter: Payer: Self-pay | Admitting: Cardiology

## 2015-11-13 DIAGNOSIS — L570 Actinic keratosis: Secondary | ICD-10-CM | POA: Diagnosis not present

## 2015-11-13 DIAGNOSIS — D485 Neoplasm of uncertain behavior of skin: Secondary | ICD-10-CM | POA: Diagnosis not present

## 2015-11-27 DIAGNOSIS — Z1231 Encounter for screening mammogram for malignant neoplasm of breast: Secondary | ICD-10-CM | POA: Diagnosis not present

## 2015-12-21 DIAGNOSIS — M25511 Pain in right shoulder: Secondary | ICD-10-CM | POA: Diagnosis not present

## 2015-12-22 ENCOUNTER — Telehealth: Payer: Self-pay | Admitting: Cardiology

## 2015-12-22 NOTE — Telephone Encounter (Signed)
Pt called in and stated that she has fell twice this week. She wants to know if it is related to her PPM. Instructed pt to send a manual transmission and someone would look at it and call her back. Pt verbalized understanding.

## 2015-12-22 NOTE — Telephone Encounter (Signed)
Transmission received. Normal device function, no episodes. 9 months remaining on battery. Patient made aware- she is appreciative.

## 2015-12-28 DIAGNOSIS — G8929 Other chronic pain: Secondary | ICD-10-CM | POA: Diagnosis not present

## 2015-12-28 DIAGNOSIS — M25511 Pain in right shoulder: Secondary | ICD-10-CM | POA: Diagnosis not present

## 2016-01-01 DIAGNOSIS — M75121 Complete rotator cuff tear or rupture of right shoulder, not specified as traumatic: Secondary | ICD-10-CM | POA: Diagnosis not present

## 2016-01-11 ENCOUNTER — Encounter: Payer: Self-pay | Admitting: Internal Medicine

## 2016-01-11 ENCOUNTER — Ambulatory Visit (INDEPENDENT_AMBULATORY_CARE_PROVIDER_SITE_OTHER): Payer: PPO | Admitting: Internal Medicine

## 2016-01-11 VITALS — BP 120/62 | HR 65 | Ht 65.0 in | Wt 163.0 lb

## 2016-01-11 DIAGNOSIS — Z950 Presence of cardiac pacemaker: Secondary | ICD-10-CM

## 2016-01-11 DIAGNOSIS — I442 Atrioventricular block, complete: Secondary | ICD-10-CM

## 2016-01-11 NOTE — Progress Notes (Signed)
      Patient Care Team: Nicoletta Dress, MD as PCP - General (Internal Medicine)   HPI  Amanda Bryant is a 75 y.o. female Seen in followup for syncope which turned out to be associated by intermittent complete heart block documented by loop recorder insertion 2008 and subsequently replaced by a dual-chamber pacemaker-Medtronic also in 2008. She's had no recurrent syncope.  The patient denies chest pain, shortness of breath, nocturnal dyspnea, orthopnea or peripheral edema. There have been no palpitations, lightheadedness or syncope.   No recent echo  She has been having a problem with her pancreas that required intervention at [facility]. In the wake of that she has struggled with weight gain. She has also stopped smoking which may be contributing.   Past Medical History  Diagnosis Date  . Complete heart block (HCC)     intermittent, documented by prev ILR  . PVC's (premature ventricular contractions)   . HTN (hypertension)   . Pacemaker     Implanted 2008  . S/P cardiac catheterization     2001- nonobstructive disease; nl lv function    No past surgical history on file.  Current Outpatient Prescriptions  Medication Sig Dispense Refill  . alendronate (FOSAMAX) 70 MG tablet Take 70 mg by mouth every 7 (seven) days. Take with a full glass of water on an empty stomach.    . Calcium Carbonate (CALTRATE 600 PO) Take by mouth daily.      . carvedilol (COREG) 6.25 MG tablet Take 6.25 mg by mouth 2 (two) times daily.    . Cholecalciferol (VITAMIN D) 1000 UNITS capsule Take 1,000 Units by mouth daily.      . fish oil-omega-3 fatty acids 1000 MG capsule Take 2 g by mouth daily.      Marland Kitchen losartan (COZAAR) 50 MG tablet Take 50 mg by mouth daily.    Marland Kitchen omeprazole (PRILOSEC) 20 MG capsule Take 20 mg by mouth daily.      Marland Kitchen venlafaxine (EFFEXOR-XR) 37.5 MG 24 hr capsule Take 37.5 mg by mouth daily.      No current facility-administered medications for this visit.    Allergies  Allergen  Reactions  . Codeine Nausea Only  . Prednisone Other (See Comments)    Reaction: Increased heart rate  . Ultram [Tramadol Hcl] Itching    Review of Systems negative except from HPI and PMH  Physical Exam BP 120/62 mmHg  Pulse 65  Ht 5\' 5"  (1.651 m)  Wt 163 lb (73.936 kg)  BMI 27.12 kg/m2 Well developed and well nourished in no acute distress HENT normal E scleral and icterus clear Neck Supple JVP flat; carotids brisk and full Clear to ausculation  Regular rate and rhythm,2/6 murmur Soft with active bowel sounds No clubbing cyanosis  Edema Alert and oriented, grossly normal motor and sensory function Skin Warm and Dry  ECG demonstrates P. synchronous pacing  Assessment and  Plan  Complete heart block  Stable   Pacemaker-Medtronic The patient's device was interrogated.  The information was reviewed. No changes were made in the programming.    Hypertension well controlled cigarettes also last year  Syncope   Blood pressure is well-controlled. No recurrent syncope.  she did fall but missed step  Discussed diet strategies Mr. Kenton Kingfisher

## 2016-01-11 NOTE — Patient Instructions (Signed)
Medication Instructions:  Your physician recommends that you continue on your current medications as directed. Please refer to the Current Medication list given to you today.   Labwork: none  Testing/Procedures: none  Follow-Up: Your physician wants you to follow-up in: 12 months with Dr. Caryl Comes. You will receive a reminder letter in the mail two months in advance. If you don't receive a letter, please call our office to schedule the follow-up appointment.  Remote monitoring is used to monitor your Pacemaker or ICD from home. This monitoring reduces the number of office visits required to check your device to one time per year. It allows [place] to keep an eye on the functioning of your device to ensure it is working properly. You are scheduled for a device check from home on 02/12/2016. You may send your transmission at any time that day. If you have a wireless device, the transmission will be sent automatically. After your physician reviews your transmission, you will receive a postcard with your next transmission date.     Any Other Special Instructions Will Be Listed Below (If Applicable).     If you need a refill on your cardiac medications before your next appointment, please call your pharmacy.

## 2016-01-12 ENCOUNTER — Encounter: Payer: PPO | Admitting: Internal Medicine

## 2016-01-15 DIAGNOSIS — I10 Essential (primary) hypertension: Secondary | ICD-10-CM | POA: Diagnosis not present

## 2016-01-15 DIAGNOSIS — M810 Age-related osteoporosis without current pathological fracture: Secondary | ICD-10-CM | POA: Diagnosis not present

## 2016-01-15 DIAGNOSIS — E559 Vitamin D deficiency, unspecified: Secondary | ICD-10-CM | POA: Diagnosis not present

## 2016-01-15 DIAGNOSIS — R7301 Impaired fasting glucose: Secondary | ICD-10-CM | POA: Diagnosis not present

## 2016-01-15 DIAGNOSIS — E785 Hyperlipidemia, unspecified: Secondary | ICD-10-CM | POA: Diagnosis not present

## 2016-01-15 DIAGNOSIS — Z6827 Body mass index (BMI) 27.0-27.9, adult: Secondary | ICD-10-CM | POA: Diagnosis not present

## 2016-01-16 ENCOUNTER — Telehealth: Payer: Self-pay | Admitting: Internal Medicine

## 2016-01-16 LAB — CUP PACEART INCLINIC DEVICE CHECK
Battery Remaining Longevity: 9 mo
Battery Voltage: 2.65 V
Brady Statistic AS VP Percent: 89 %
Date Time Interrogation Session: 20170413181115
Implantable Lead Implant Date: 20081014
Implantable Lead Model: 5076
Lead Channel Impedance Value: 576 Ohm
Lead Channel Pacing Threshold Amplitude: 0.75 V
Lead Channel Pacing Threshold Pulse Width: 0.4 ms
Lead Channel Pacing Threshold Pulse Width: 0.4 ms
Lead Channel Setting Pacing Amplitude: 2 V
Lead Channel Setting Pacing Amplitude: 2.5 V
Lead Channel Setting Sensing Sensitivity: 4 mV
MDC IDC LEAD IMPLANT DT: 20081014
MDC IDC LEAD LOCATION: 753859
MDC IDC LEAD LOCATION: 753860
MDC IDC MSMT BATTERY IMPEDANCE: 4171 Ohm
MDC IDC MSMT LEADCHNL RA PACING THRESHOLD AMPLITUDE: 0.5 V
MDC IDC MSMT LEADCHNL RA SENSING INTR AMPL: 2.8 mV
MDC IDC MSMT LEADCHNL RV IMPEDANCE VALUE: 623 Ohm
MDC IDC SET LEADCHNL RV PACING PULSEWIDTH: 0.4 ms
MDC IDC STAT BRADY AP VP PERCENT: 11 %
MDC IDC STAT BRADY AP VS PERCENT: 0 %
MDC IDC STAT BRADY AS VS PERCENT: 0 %

## 2016-01-18 NOTE — Telephone Encounter (Signed)
Will forward to MD for review- the patient was seen 01/11/16.

## 2016-01-18 NOTE — Telephone Encounter (Signed)
New message       Request for surgical clearance:  What type of surgery is being performed?  Rt shoulder procedure 1. When is this surgery scheduled? 01-23-16  2. Are there any medications that need to be held prior to surgery and how long? Surgical clearance  3. Name of physician performing surgery? Dr Samule Dry  4. What is your office phone and fax number? Fax [phone]

## 2016-01-18 NOTE — Telephone Encounter (Signed)
Clearance form signed by Dr. Caryl Comes and taken to medical records to fax.

## 2016-01-30 DIAGNOSIS — M75121 Complete rotator cuff tear or rupture of right shoulder, not specified as traumatic: Secondary | ICD-10-CM | POA: Diagnosis not present

## 2016-01-30 DIAGNOSIS — M67911 Unspecified disorder of synovium and tendon, right shoulder: Secondary | ICD-10-CM | POA: Diagnosis not present

## 2016-01-30 DIAGNOSIS — G8918 Other acute postprocedural pain: Secondary | ICD-10-CM | POA: Diagnosis not present

## 2016-01-30 DIAGNOSIS — K219 Gastro-esophageal reflux disease without esophagitis: Secondary | ICD-10-CM | POA: Diagnosis not present

## 2016-01-30 DIAGNOSIS — F419 Anxiety disorder, unspecified: Secondary | ICD-10-CM | POA: Diagnosis not present

## 2016-01-30 DIAGNOSIS — I10 Essential (primary) hypertension: Secondary | ICD-10-CM | POA: Diagnosis not present

## 2016-01-30 DIAGNOSIS — M75122 Complete rotator cuff tear or rupture of left shoulder, not specified as traumatic: Secondary | ICD-10-CM | POA: Diagnosis not present

## 2016-01-30 DIAGNOSIS — E785 Hyperlipidemia, unspecified: Secondary | ICD-10-CM | POA: Diagnosis not present

## 2016-01-30 DIAGNOSIS — M7551 Bursitis of right shoulder: Secondary | ICD-10-CM | POA: Diagnosis not present

## 2016-01-30 DIAGNOSIS — Z950 Presence of cardiac pacemaker: Secondary | ICD-10-CM | POA: Diagnosis not present

## 2016-01-30 DIAGNOSIS — F329 Major depressive disorder, single episode, unspecified: Secondary | ICD-10-CM | POA: Diagnosis not present

## 2016-01-30 DIAGNOSIS — M25511 Pain in right shoulder: Secondary | ICD-10-CM | POA: Diagnosis not present

## 2016-01-30 DIAGNOSIS — Z79899 Other long term (current) drug therapy: Secondary | ICD-10-CM | POA: Diagnosis not present

## 2016-02-12 ENCOUNTER — Telehealth: Payer: Self-pay | Admitting: Cardiology

## 2016-02-12 ENCOUNTER — Ambulatory Visit (INDEPENDENT_AMBULATORY_CARE_PROVIDER_SITE_OTHER): Payer: PPO | Admitting: *Deleted

## 2016-02-12 DIAGNOSIS — Z950 Presence of cardiac pacemaker: Secondary | ICD-10-CM

## 2016-02-12 NOTE — Telephone Encounter (Signed)
Spoke with pt and reminded pt of remote transmission that is due today. Pt verbalized understanding.   

## 2016-02-13 NOTE — Progress Notes (Signed)
Remote pacemaker transmission.   

## 2016-03-01 LAB — CUP PACEART REMOTE DEVICE CHECK
Battery Remaining Longevity: 7 mo
Battery Voltage: 2.64 V
Brady Statistic AP VP Percent: 11 %
Brady Statistic AS VP Percent: 89 %
Brady Statistic AS VS Percent: 0 %
Implantable Lead Implant Date: 20081014
Implantable Lead Implant Date: 20081014
Implantable Lead Model: 5076
Implantable Lead Model: 5076
Lead Channel Impedance Value: 621 Ohm
Lead Channel Impedance Value: 690 Ohm
Lead Channel Pacing Threshold Amplitude: 0.375 V
Lead Channel Pacing Threshold Amplitude: 0.625 V
Lead Channel Pacing Threshold Pulse Width: 0.4 ms
Lead Channel Pacing Threshold Pulse Width: 0.4 ms
Lead Channel Setting Pacing Amplitude: 2 V
Lead Channel Setting Pacing Pulse Width: 0.4 ms
Lead Channel Setting Sensing Sensitivity: 4 mV
MDC IDC LEAD LOCATION: 753859
MDC IDC LEAD LOCATION: 753860
MDC IDC MSMT BATTERY IMPEDANCE: 4675 Ohm
MDC IDC SESS DTM: 20170515162121
MDC IDC SET LEADCHNL RV PACING AMPLITUDE: 2.5 V
MDC IDC STAT BRADY AP VS PERCENT: 0 %

## 2016-03-13 ENCOUNTER — Encounter: Payer: Self-pay | Admitting: Cardiology

## 2016-03-14 DIAGNOSIS — M75121 Complete rotator cuff tear or rupture of right shoulder, not specified as traumatic: Secondary | ICD-10-CM | POA: Diagnosis not present

## 2016-03-14 DIAGNOSIS — M25511 Pain in right shoulder: Secondary | ICD-10-CM | POA: Diagnosis not present

## 2016-03-14 DIAGNOSIS — M25611 Stiffness of right shoulder, not elsewhere classified: Secondary | ICD-10-CM | POA: Diagnosis not present

## 2016-03-15 ENCOUNTER — Ambulatory Visit (INDEPENDENT_AMBULATORY_CARE_PROVIDER_SITE_OTHER): Payer: PPO | Admitting: *Deleted

## 2016-03-15 ENCOUNTER — Telehealth: Payer: Self-pay | Admitting: Internal Medicine

## 2016-03-15 ENCOUNTER — Telehealth: Payer: Self-pay | Admitting: Cardiology

## 2016-03-15 DIAGNOSIS — Z950 Presence of cardiac pacemaker: Secondary | ICD-10-CM | POA: Diagnosis not present

## 2016-03-15 DIAGNOSIS — I442 Atrioventricular block, complete: Secondary | ICD-10-CM

## 2016-03-15 NOTE — Telephone Encounter (Signed)
New Message  P stated originally had remote scheduled for today- 6/16. P{t stated she had sent signal on Mon- 6/12. Wanted to inform Device clinic.

## 2016-03-15 NOTE — Telephone Encounter (Signed)
LMOVM reminding pt to send remote transmission.   

## 2016-03-15 NOTE — Telephone Encounter (Signed)
Spoke w/ pt and informed her that we did receive her remote transmission on 03-11-16 and she doesn't need to send a transmission today. Pt verbalized understanding.

## 2016-03-15 NOTE — Progress Notes (Signed)
Remote pacemaker transmission.   

## 2016-03-19 LAB — CUP PACEART REMOTE DEVICE CHECK
Battery Impedance: 4908 Ohm
Battery Remaining Longevity: 6 mo
Brady Statistic AP VP Percent: 6 %
Brady Statistic AS VS Percent: 0 %
Date Time Interrogation Session: 20170612163603
Implantable Lead Implant Date: 20081014
Implantable Lead Location: 753859
Implantable Lead Model: 5076
Implantable Lead Model: 5076
Lead Channel Impedance Value: 572 Ohm
Lead Channel Impedance Value: 724 Ohm
Lead Channel Pacing Threshold Pulse Width: 0.4 ms
Lead Channel Setting Pacing Amplitude: 2 V
Lead Channel Setting Pacing Amplitude: 2.5 V
MDC IDC LEAD IMPLANT DT: 20081014
MDC IDC LEAD LOCATION: 753860
MDC IDC MSMT BATTERY VOLTAGE: 2.63 V
MDC IDC MSMT LEADCHNL RA PACING THRESHOLD AMPLITUDE: 0.375 V
MDC IDC MSMT LEADCHNL RV PACING THRESHOLD AMPLITUDE: 0.625 V
MDC IDC MSMT LEADCHNL RV PACING THRESHOLD PULSEWIDTH: 0.4 ms
MDC IDC SET LEADCHNL RV PACING PULSEWIDTH: 0.4 ms
MDC IDC SET LEADCHNL RV SENSING SENSITIVITY: 4 mV
MDC IDC STAT BRADY AP VS PERCENT: 0 %
MDC IDC STAT BRADY AS VP PERCENT: 94 %

## 2016-03-20 ENCOUNTER — Encounter: Payer: Self-pay | Admitting: Cardiology

## 2016-03-20 DIAGNOSIS — M75121 Complete rotator cuff tear or rupture of right shoulder, not specified as traumatic: Secondary | ICD-10-CM | POA: Diagnosis not present

## 2016-03-20 DIAGNOSIS — M25511 Pain in right shoulder: Secondary | ICD-10-CM | POA: Diagnosis not present

## 2016-03-20 DIAGNOSIS — M25611 Stiffness of right shoulder, not elsewhere classified: Secondary | ICD-10-CM | POA: Diagnosis not present

## 2016-03-22 DIAGNOSIS — M25511 Pain in right shoulder: Secondary | ICD-10-CM | POA: Diagnosis not present

## 2016-03-22 DIAGNOSIS — M25611 Stiffness of right shoulder, not elsewhere classified: Secondary | ICD-10-CM | POA: Diagnosis not present

## 2016-03-22 DIAGNOSIS — M75121 Complete rotator cuff tear or rupture of right shoulder, not specified as traumatic: Secondary | ICD-10-CM | POA: Diagnosis not present

## 2016-03-27 DIAGNOSIS — M75121 Complete rotator cuff tear or rupture of right shoulder, not specified as traumatic: Secondary | ICD-10-CM | POA: Diagnosis not present

## 2016-03-27 DIAGNOSIS — M25511 Pain in right shoulder: Secondary | ICD-10-CM | POA: Diagnosis not present

## 2016-03-27 DIAGNOSIS — M25611 Stiffness of right shoulder, not elsewhere classified: Secondary | ICD-10-CM | POA: Diagnosis not present

## 2016-03-28 DIAGNOSIS — M25611 Stiffness of right shoulder, not elsewhere classified: Secondary | ICD-10-CM | POA: Diagnosis not present

## 2016-03-28 DIAGNOSIS — M75121 Complete rotator cuff tear or rupture of right shoulder, not specified as traumatic: Secondary | ICD-10-CM | POA: Diagnosis not present

## 2016-03-28 DIAGNOSIS — M25511 Pain in right shoulder: Secondary | ICD-10-CM | POA: Diagnosis not present

## 2016-04-01 DIAGNOSIS — M25611 Stiffness of right shoulder, not elsewhere classified: Secondary | ICD-10-CM | POA: Diagnosis not present

## 2016-04-01 DIAGNOSIS — M25511 Pain in right shoulder: Secondary | ICD-10-CM | POA: Diagnosis not present

## 2016-04-01 DIAGNOSIS — M75121 Complete rotator cuff tear or rupture of right shoulder, not specified as traumatic: Secondary | ICD-10-CM | POA: Diagnosis not present

## 2016-04-03 DIAGNOSIS — M25611 Stiffness of right shoulder, not elsewhere classified: Secondary | ICD-10-CM | POA: Diagnosis not present

## 2016-04-03 DIAGNOSIS — M25511 Pain in right shoulder: Secondary | ICD-10-CM | POA: Diagnosis not present

## 2016-04-03 DIAGNOSIS — M75121 Complete rotator cuff tear or rupture of right shoulder, not specified as traumatic: Secondary | ICD-10-CM | POA: Diagnosis not present

## 2016-04-05 DIAGNOSIS — R600 Localized edema: Secondary | ICD-10-CM | POA: Diagnosis not present

## 2016-04-05 DIAGNOSIS — E785 Hyperlipidemia, unspecified: Secondary | ICD-10-CM | POA: Diagnosis not present

## 2016-04-05 DIAGNOSIS — Z6827 Body mass index (BMI) 27.0-27.9, adult: Secondary | ICD-10-CM | POA: Diagnosis not present

## 2016-04-05 DIAGNOSIS — E663 Overweight: Secondary | ICD-10-CM | POA: Diagnosis not present

## 2016-04-08 DIAGNOSIS — M25511 Pain in right shoulder: Secondary | ICD-10-CM | POA: Diagnosis not present

## 2016-04-08 DIAGNOSIS — M75121 Complete rotator cuff tear or rupture of right shoulder, not specified as traumatic: Secondary | ICD-10-CM | POA: Diagnosis not present

## 2016-04-08 DIAGNOSIS — M25611 Stiffness of right shoulder, not elsewhere classified: Secondary | ICD-10-CM | POA: Diagnosis not present

## 2016-04-10 DIAGNOSIS — M75121 Complete rotator cuff tear or rupture of right shoulder, not specified as traumatic: Secondary | ICD-10-CM | POA: Diagnosis not present

## 2016-04-10 DIAGNOSIS — M25511 Pain in right shoulder: Secondary | ICD-10-CM | POA: Diagnosis not present

## 2016-04-10 DIAGNOSIS — M25611 Stiffness of right shoulder, not elsewhere classified: Secondary | ICD-10-CM | POA: Diagnosis not present

## 2016-04-15 ENCOUNTER — Ambulatory Visit (INDEPENDENT_AMBULATORY_CARE_PROVIDER_SITE_OTHER): Payer: PPO | Admitting: *Deleted

## 2016-04-15 ENCOUNTER — Telehealth: Payer: Self-pay | Admitting: Cardiology

## 2016-04-15 DIAGNOSIS — Z950 Presence of cardiac pacemaker: Secondary | ICD-10-CM

## 2016-04-15 DIAGNOSIS — M25611 Stiffness of right shoulder, not elsewhere classified: Secondary | ICD-10-CM | POA: Diagnosis not present

## 2016-04-15 DIAGNOSIS — M25511 Pain in right shoulder: Secondary | ICD-10-CM | POA: Diagnosis not present

## 2016-04-15 DIAGNOSIS — M75121 Complete rotator cuff tear or rupture of right shoulder, not specified as traumatic: Secondary | ICD-10-CM | POA: Diagnosis not present

## 2016-04-15 NOTE — Progress Notes (Signed)
Remote pacemaker transmission.   

## 2016-04-15 NOTE — Telephone Encounter (Signed)
LMOVM reminding pt to send remote transmission.   

## 2016-04-17 ENCOUNTER — Encounter: Payer: Self-pay | Admitting: Cardiology

## 2016-04-17 DIAGNOSIS — M75121 Complete rotator cuff tear or rupture of right shoulder, not specified as traumatic: Secondary | ICD-10-CM | POA: Diagnosis not present

## 2016-04-17 DIAGNOSIS — M25511 Pain in right shoulder: Secondary | ICD-10-CM | POA: Diagnosis not present

## 2016-04-17 DIAGNOSIS — M25611 Stiffness of right shoulder, not elsewhere classified: Secondary | ICD-10-CM | POA: Diagnosis not present

## 2016-04-18 LAB — CUP PACEART REMOTE DEVICE CHECK
Battery Impedance: 5406 Ohm
Battery Remaining Longevity: 4 mo
Brady Statistic AS VS Percent: 0 %
Date Time Interrogation Session: 20170717140426
Implantable Lead Implant Date: 20081014
Implantable Lead Model: 5076
Lead Channel Setting Pacing Amplitude: 2 V
Lead Channel Setting Pacing Amplitude: 2.5 V
Lead Channel Setting Sensing Sensitivity: 4 mV
MDC IDC LEAD IMPLANT DT: 20081014
MDC IDC LEAD LOCATION: 753859
MDC IDC LEAD LOCATION: 753860
MDC IDC MSMT BATTERY VOLTAGE: 2.62 V
MDC IDC MSMT LEADCHNL RA IMPEDANCE VALUE: 596 Ohm
MDC IDC MSMT LEADCHNL RA PACING THRESHOLD AMPLITUDE: 0.375 V
MDC IDC MSMT LEADCHNL RA PACING THRESHOLD PULSEWIDTH: 0.4 ms
MDC IDC MSMT LEADCHNL RV IMPEDANCE VALUE: 682 Ohm
MDC IDC MSMT LEADCHNL RV PACING THRESHOLD AMPLITUDE: 0.75 V
MDC IDC MSMT LEADCHNL RV PACING THRESHOLD PULSEWIDTH: 0.4 ms
MDC IDC SET LEADCHNL RV PACING PULSEWIDTH: 0.4 ms
MDC IDC STAT BRADY AP VP PERCENT: 5 %
MDC IDC STAT BRADY AP VS PERCENT: 0 %
MDC IDC STAT BRADY AS VP PERCENT: 94 %

## 2016-04-22 DIAGNOSIS — I10 Essential (primary) hypertension: Secondary | ICD-10-CM | POA: Diagnosis not present

## 2016-04-22 DIAGNOSIS — R600 Localized edema: Secondary | ICD-10-CM | POA: Diagnosis not present

## 2016-04-22 DIAGNOSIS — E785 Hyperlipidemia, unspecified: Secondary | ICD-10-CM | POA: Diagnosis not present

## 2016-04-22 DIAGNOSIS — F418 Other specified anxiety disorders: Secondary | ICD-10-CM | POA: Diagnosis not present

## 2016-04-22 DIAGNOSIS — E559 Vitamin D deficiency, unspecified: Secondary | ICD-10-CM | POA: Diagnosis not present

## 2016-04-22 DIAGNOSIS — M8589 Other specified disorders of bone density and structure, multiple sites: Secondary | ICD-10-CM | POA: Diagnosis not present

## 2016-04-22 DIAGNOSIS — Z9181 History of falling: Secondary | ICD-10-CM | POA: Diagnosis not present

## 2016-04-22 DIAGNOSIS — M810 Age-related osteoporosis without current pathological fracture: Secondary | ICD-10-CM | POA: Diagnosis not present

## 2016-04-22 DIAGNOSIS — R7301 Impaired fasting glucose: Secondary | ICD-10-CM | POA: Diagnosis not present

## 2016-04-22 DIAGNOSIS — M25511 Pain in right shoulder: Secondary | ICD-10-CM | POA: Diagnosis not present

## 2016-04-22 DIAGNOSIS — M75121 Complete rotator cuff tear or rupture of right shoulder, not specified as traumatic: Secondary | ICD-10-CM | POA: Diagnosis not present

## 2016-04-22 DIAGNOSIS — M25611 Stiffness of right shoulder, not elsewhere classified: Secondary | ICD-10-CM | POA: Diagnosis not present

## 2016-04-24 DIAGNOSIS — M25611 Stiffness of right shoulder, not elsewhere classified: Secondary | ICD-10-CM | POA: Diagnosis not present

## 2016-04-24 DIAGNOSIS — M25511 Pain in right shoulder: Secondary | ICD-10-CM | POA: Diagnosis not present

## 2016-04-24 DIAGNOSIS — M75121 Complete rotator cuff tear or rupture of right shoulder, not specified as traumatic: Secondary | ICD-10-CM | POA: Diagnosis not present

## 2016-04-25 DIAGNOSIS — R600 Localized edema: Secondary | ICD-10-CM | POA: Diagnosis not present

## 2016-04-25 DIAGNOSIS — M7121 Synovial cyst of popliteal space [Baker], right knee: Secondary | ICD-10-CM | POA: Diagnosis not present

## 2016-04-29 DIAGNOSIS — M25511 Pain in right shoulder: Secondary | ICD-10-CM | POA: Diagnosis not present

## 2016-04-29 DIAGNOSIS — M25611 Stiffness of right shoulder, not elsewhere classified: Secondary | ICD-10-CM | POA: Diagnosis not present

## 2016-04-29 DIAGNOSIS — M75121 Complete rotator cuff tear or rupture of right shoulder, not specified as traumatic: Secondary | ICD-10-CM | POA: Diagnosis not present

## 2016-04-30 ENCOUNTER — Telehealth: Payer: Self-pay | Admitting: Internal Medicine

## 2016-04-30 DIAGNOSIS — R600 Localized edema: Secondary | ICD-10-CM

## 2016-04-30 NOTE — Telephone Encounter (Signed)
I called and spoke with the patient. She reports bilateral lower extremity swelling x 1 month. She denies weight gain. She followed up with her PCP regarding this issue and he started her on lasix 40 mg once daily ~ 1 month ago. She had f/u labs last week and a venous doppler- which were all normal per her report. She was instructed by her PCP to contact cardiology. She has noticed no change in her urination on lasix.  She is staying hydrated & no increase in sodium. She has had 2 episodes of lightheadedness recently- once after working in her garden and once after coming out of church. She has no recent echo on file. Per Dr. Caryl Comes- schedule and echo and PA/ NP follow up. The patient is aware and agreeable. Echo order placed- message to [facility] to schedule follow up with Amber/ Renee.

## 2016-04-30 NOTE — Telephone Encounter (Signed)
New message     Pt c/o swelling: STAT is pt has developed SOB within 24 hours  1. How long have you been experiencing swelling? Per pt for a while  2. Where is the swelling located? legs  3.  Are you currently taking a "fluid pill"?yes 4.  Are you currently SOB? no  5.  Have you traveled recently?no    The pt has seen her primary MD; and done a venous doppler and it did not show anything   The pt is still complaining, of the pain in legs and the pt states the legs are purple   The pt does not want to wait and see the MD on August if she can help it   The pt is feeling light headed from the swelling

## 2016-05-01 DIAGNOSIS — M8589 Other specified disorders of bone density and structure, multiple sites: Secondary | ICD-10-CM | POA: Diagnosis not present

## 2016-05-01 DIAGNOSIS — M75121 Complete rotator cuff tear or rupture of right shoulder, not specified as traumatic: Secondary | ICD-10-CM | POA: Diagnosis not present

## 2016-05-01 DIAGNOSIS — M25611 Stiffness of right shoulder, not elsewhere classified: Secondary | ICD-10-CM | POA: Diagnosis not present

## 2016-05-01 DIAGNOSIS — M25511 Pain in right shoulder: Secondary | ICD-10-CM | POA: Diagnosis not present

## 2016-05-01 DIAGNOSIS — M8588 Other specified disorders of bone density and structure, other site: Secondary | ICD-10-CM | POA: Diagnosis not present

## 2016-05-03 ENCOUNTER — Encounter (INDEPENDENT_AMBULATORY_CARE_PROVIDER_SITE_OTHER): Payer: Self-pay

## 2016-05-03 ENCOUNTER — Ambulatory Visit (INDEPENDENT_AMBULATORY_CARE_PROVIDER_SITE_OTHER): Payer: PPO | Admitting: Physician Assistant

## 2016-05-03 ENCOUNTER — Encounter: Payer: Self-pay | Admitting: Physician Assistant

## 2016-05-03 ENCOUNTER — Telehealth: Payer: Self-pay | Admitting: Internal Medicine

## 2016-05-03 VITALS — BP 144/66 | HR 69 | Ht 65.0 in | Wt 163.0 lb

## 2016-05-03 DIAGNOSIS — Z5181 Encounter for therapeutic drug level monitoring: Secondary | ICD-10-CM

## 2016-05-03 DIAGNOSIS — I442 Atrioventricular block, complete: Secondary | ICD-10-CM

## 2016-05-03 DIAGNOSIS — R600 Localized edema: Secondary | ICD-10-CM | POA: Diagnosis not present

## 2016-05-03 DIAGNOSIS — I10 Essential (primary) hypertension: Secondary | ICD-10-CM | POA: Diagnosis not present

## 2016-05-03 LAB — CUP PACEART INCLINIC DEVICE CHECK
Battery Impedance: 5520 Ohm
Battery Remaining Longevity: 3 mo
Battery Voltage: 2.62 V
Brady Statistic AS VP Percent: 95 %
Implantable Lead Implant Date: 20081014
Implantable Lead Implant Date: 20081014
Implantable Lead Model: 5076
Implantable Lead Model: 5076
Lead Channel Impedance Value: 745 Ohm
Lead Channel Pacing Threshold Amplitude: 0.375 V
Lead Channel Pacing Threshold Amplitude: 0.5 V
Lead Channel Pacing Threshold Amplitude: 0.625 V
Lead Channel Pacing Threshold Pulse Width: 0.4 ms
Lead Channel Pacing Threshold Pulse Width: 0.4 ms
Lead Channel Setting Pacing Amplitude: 2.5 V
Lead Channel Setting Sensing Sensitivity: 4 mV
MDC IDC LEAD LOCATION: 753859
MDC IDC LEAD LOCATION: 753860
MDC IDC MSMT LEADCHNL RA IMPEDANCE VALUE: 620 Ohm
MDC IDC MSMT LEADCHNL RA PACING THRESHOLD PULSEWIDTH: 0.4 ms
MDC IDC MSMT LEADCHNL RA SENSING INTR AMPL: 2 mV
MDC IDC MSMT LEADCHNL RV PACING THRESHOLD AMPLITUDE: 0.75 V
MDC IDC MSMT LEADCHNL RV PACING THRESHOLD PULSEWIDTH: 0.4 ms
MDC IDC SESS DTM: 20170804155900
MDC IDC SET LEADCHNL RA PACING AMPLITUDE: 2 V
MDC IDC SET LEADCHNL RV PACING PULSEWIDTH: 0.4 ms
MDC IDC STAT BRADY AP VP PERCENT: 5 %
MDC IDC STAT BRADY AP VS PERCENT: 0 %
MDC IDC STAT BRADY AS VS PERCENT: 0 %

## 2016-05-03 NOTE — Progress Notes (Signed)
Cardiology Office Note Date:  05/03/2016  Patient ID:  Amanda Bryant, Amanda Bryant 01-20-1941, MRN [id number] PCP:  Nicoletta Dress, MD  Cardiologist:  Dr. Caryl Comes   Chief Complaint:  LE swelling  History of Present Illness: Amanda Bryant is a 75 y.o. female with history of CHB w/PPM, HTN comes to the office today for an unscheduled appointment to be seen for Dr. Caryl Comes to evalute LE swelling.  She was last seen by him in April and doing well at that time.  She reports about a month or so ago she noted ankle swelling/LE swelling, and took one of her husband';s 20mg  lasix pills without change, saw her PMD who started her on Lasix 40mg  daily, despite this her swelling persists.  She saw her PMD again in f/u, last week had labs that she was told looked normal and [place] of her LE that she was told she had no blood clots and a small cyst behind her L knee of no consequence.  She rports some days skin is tight and they are uncomfortable.  She denies any kind of symptoms otherwise.  She walks routinely without change in her exertional capacity, no CP, palpitations, no SOB, DOE, no symptoms ot PND or orthopnea.  No dizziness, near syncope or syncope.  She reports that she wakes with some swelling and as the day goes increases and can get to about mid shin some days.  She reports b/l heel pain particularly when first getting OOB in the AM.   Device information: MDT dual chamber PPM, implanted 07/14/07, CHB, Dr. Caryl Comes   Past Medical History:  Diagnosis Date  . Complete heart block (HCC)    intermittent, documented by prev ILR  . HTN (hypertension)   . Pacemaker    Implanted 2008  . PVC's (premature ventricular contractions)   . S/P cardiac catheterization    2001- nonobstructive disease; nl lv function    No past surgical history on file.  Current Outpatient Prescriptions  Medication Sig Dispense Refill  . alendronate (FOSAMAX) 70 MG tablet Take 70 mg by mouth every 7 (seven) days. Take with a full glass  of water on an empty stomach.    . Calcium Carbonate (CALTRATE 600 PO) Take by mouth daily.      . carvedilol (COREG) 6.25 MG tablet Take 6.25 mg by mouth 2 (two) times daily.    . Cholecalciferol (VITAMIN D) 1000 UNITS capsule Take 1,000 Units by mouth daily.      . fish oil-omega-3 fatty acids 1000 MG capsule Take 2 g by mouth daily.      . furosemide (LASIX) 40 MG tablet Take 40 mg by mouth.    . losartan (COZAAR) 50 MG tablet Take 50 mg by mouth daily.    Marland Kitchen omeprazole (PRILOSEC) 20 MG capsule Take 20 mg by mouth daily.      Marland Kitchen venlafaxine (EFFEXOR-XR) 37.5 MG 24 hr capsule Take 37.5 mg by mouth daily.      No current facility-administered medications for this visit.     Allergies:   Codeine; Prednisone; and Ultram [tramadol hcl]   Social History:  The patient  reports that she has never smoked. She has never used smokeless tobacco.   Family History:  The patient's family history includes Diabetes in her father and mother; Heart disease in her father and mother.  ROS:  Please see the history of present illness.  All other systems are reviewed and otherwise negative.   PHYSICAL EXAM:  VS:  BP (!) 144/66   Pulse 69   Ht 5\' 5"  (1.651 m)   Wt 163 lb (73.9 kg)   BMI 27.12 kg/m  BMI: Body mass index is 27.12 kg/m. Well nourished, well developed, in no acute distress  HEENT: normocephalic, atraumatic  Neck: no JVD, carotid bruits or masses Cardiac: RRR; no significant murmurs, no rubs, or gallops Lungs:  clear to auscultation bilaterally, no wheezing, rhonchi or rales  Abd: soft, nontender MS: no deformity or atrophy Ext: 1 ++  edema  B/l LE at the ankles today, slightly higher Skin: warm and dry, no rash Neuro:  No gross deficits appreciated Psych: euthymic mood, full affect  PPM/ site is stable, no tethering or discomfort   EKG:  Done 01/11/16 is SR V paced PPM interrogation today with normal device function, battery has not tripped ERI yet, no episodes  Recent Labs: No  results found for requested labs within last 8760 hours.  No results found for requested labs within last 8760 hours.   CrCl cannot be calculated (Patient's most recent lab result is older than the maximum 21 days allowed.).   Wt Readings from Last 3 Encounters:  05/03/16 163 lb (73.9 kg)  01/11/16 163 lb (73.9 kg)  12/21/14 156 lb (70.8 kg)     Other studies reviewed: Additional studies/records reviewed today include: summarized above  ASSESSMENT AND PLAN:  1. Edema     No other symptoms    2. CHB, PPM     Battery is nearing ERI, normal function  3. HTN     stable  Disposition:  We will ask for her labs from her PMD done last week, she states she has a copy and will bring them next week when she comes for her echo/lab, Increase lasix to 40mg  alt days with 60, she is already following low salt diet, encouraged elevate feet when seated, we will get an echo doppler and repeat BMET next week, continue monthly battery checks and see her back in 36mo,sooner if needed, pending her echo.   Current medicines are reviewed at length with the patient today.  The patient did not have any concerns regarding medicines.  [name], PA-C 05/03/2016 4:26 PM     [facility] [address] [place] [address] [phone] (office)  [phone] (fax)

## 2016-05-03 NOTE — Telephone Encounter (Signed)
BUSY SIGNAL .WILL TRY LATER ./CY 

## 2016-05-03 NOTE — Telephone Encounter (Signed)
SPOKE WITH   PT  , RE MESSAGE  ,SEE OTHER   PHONE NOTE. PER   HEATHER  AND  DR Caryl Comes   PT  NEEDS  ECHO AND  F/U WITH NP  OR  PA  PER PT  WAS NEVER  CALLED    FROM 05-01-15 RECOMMENDATION .APPT  MADE WITH RENEE FOR  TODAY AT  3:00 PM .Adonis Housekeeper

## 2016-05-03 NOTE — Telephone Encounter (Signed)
New message   Pt c/o swelling: STAT is pt has developed SOB within 24 hours  1. How long have you been experiencing swelling? MONTH 2. Where is the swelling located? LEGS AND FEET  3.  Are you currently taking a "fluid pill"? YES  4.  Are you currently SOB? NO  5.  Have you traveled recently? NO   Pt stated she took one of her husbands lasix 20mg  and it did not work, she said that she is still waiting on the rn to call her back but she has never heard back from the rn

## 2016-05-03 NOTE — Patient Instructions (Addendum)
Medication Instructions:   START TAKING LASIX 40  MG DAILY ALTERNATING 60 MG EVERY OTHER DAY   If you need a refill on your cardiac medications before your next appointment, please call your pharmacy.  Labwork: RETURN FOR BMET IN A WEEK    Testing/Procedures:  Your physician has requested that you have an echocardiogram. Echocardiography is a painless test that uses sound waves to create images of your heart. It provides your doctor with information about the size and shape of your heart and how well your heart's chambers and valves are working. This procedure takes approximately one hour. There are no restrictions for this procedure.     Follow-Up: IN ONE MONTH WITH KLEIN OR RENEE ON A DAY KLEIN IN OFFICE    Any Other Special Instructions Will Be Listed Below (If Applicable).

## 2016-05-09 DIAGNOSIS — M75121 Complete rotator cuff tear or rupture of right shoulder, not specified as traumatic: Secondary | ICD-10-CM | POA: Diagnosis not present

## 2016-05-09 DIAGNOSIS — M25511 Pain in right shoulder: Secondary | ICD-10-CM | POA: Diagnosis not present

## 2016-05-09 DIAGNOSIS — M25611 Stiffness of right shoulder, not elsewhere classified: Secondary | ICD-10-CM | POA: Diagnosis not present

## 2016-05-13 ENCOUNTER — Other Ambulatory Visit: Payer: PPO | Admitting: *Deleted

## 2016-05-13 ENCOUNTER — Other Ambulatory Visit: Payer: Self-pay

## 2016-05-13 ENCOUNTER — Ambulatory Visit (HOSPITAL_COMMUNITY): Payer: PPO | Attending: Physician Assistant

## 2016-05-13 DIAGNOSIS — Z950 Presence of cardiac pacemaker: Secondary | ICD-10-CM | POA: Insufficient documentation

## 2016-05-13 DIAGNOSIS — Z5181 Encounter for therapeutic drug level monitoring: Secondary | ICD-10-CM

## 2016-05-13 DIAGNOSIS — R600 Localized edema: Secondary | ICD-10-CM | POA: Diagnosis not present

## 2016-05-13 DIAGNOSIS — I442 Atrioventricular block, complete: Secondary | ICD-10-CM | POA: Diagnosis not present

## 2016-05-13 DIAGNOSIS — I119 Hypertensive heart disease without heart failure: Secondary | ICD-10-CM | POA: Diagnosis not present

## 2016-05-13 LAB — BASIC METABOLIC PANEL
BUN: 18 mg/dL (ref 7–25)
CHLORIDE: 104 mmol/L (ref 98–110)
CO2: 29 mmol/L (ref 20–31)
CREATININE: 0.9 mg/dL (ref 0.60–0.93)
Calcium: 9.1 mg/dL (ref 8.6–10.4)
GLUCOSE: 119 mg/dL — AB (ref 65–99)
Potassium: 4.5 mmol/L (ref 3.5–5.3)
Sodium: 142 mmol/L (ref 135–146)

## 2016-05-14 DIAGNOSIS — M25511 Pain in right shoulder: Secondary | ICD-10-CM | POA: Diagnosis not present

## 2016-05-14 DIAGNOSIS — M25611 Stiffness of right shoulder, not elsewhere classified: Secondary | ICD-10-CM | POA: Diagnosis not present

## 2016-05-14 DIAGNOSIS — M75121 Complete rotator cuff tear or rupture of right shoulder, not specified as traumatic: Secondary | ICD-10-CM | POA: Diagnosis not present

## 2016-05-15 ENCOUNTER — Telehealth: Payer: Self-pay | Admitting: *Deleted

## 2016-05-15 NOTE — Telephone Encounter (Signed)
SPOKE TO  PT ABOUT RESULTS AND VERBALIZED UNDERSTANDING. PT MENTIONED ABOUT ECHO RESULTS AND WAS TOLD THAT PROVIDER WILL BE CONTACTED TO SEE WHEN  ILL BE ABLE TO CALL BACK  WITH  RESULTS

## 2016-05-15 NOTE — Telephone Encounter (Signed)
-----   Message from [facility], [place] sent at 05/15/2016  8:49 AM EDT ----- Please let the patient know her lab looks good, no changes at this time.  Keep her scheduled appointment.  Thanks [employer]

## 2016-05-16 ENCOUNTER — Telehealth: Payer: Self-pay | Admitting: *Deleted

## 2016-05-16 DIAGNOSIS — M25511 Pain in right shoulder: Secondary | ICD-10-CM | POA: Diagnosis not present

## 2016-05-16 DIAGNOSIS — M75121 Complete rotator cuff tear or rupture of right shoulder, not specified as traumatic: Secondary | ICD-10-CM | POA: Diagnosis not present

## 2016-05-16 DIAGNOSIS — M25611 Stiffness of right shoulder, not elsewhere classified: Secondary | ICD-10-CM | POA: Diagnosis not present

## 2016-05-16 NOTE — Telephone Encounter (Signed)
SPOKE TO PATIENT  ABOUT ECHO RESULTS AND TO CONTINUE MONITOR BLOOD PRESSURE AT HOME AND FOLLOW UP APPOINTMENT WITH [facility] IN [place]

## 2016-05-21 DIAGNOSIS — M25611 Stiffness of right shoulder, not elsewhere classified: Secondary | ICD-10-CM | POA: Diagnosis not present

## 2016-05-21 DIAGNOSIS — M25511 Pain in right shoulder: Secondary | ICD-10-CM | POA: Diagnosis not present

## 2016-05-21 DIAGNOSIS — M75121 Complete rotator cuff tear or rupture of right shoulder, not specified as traumatic: Secondary | ICD-10-CM | POA: Diagnosis not present

## 2016-05-23 DIAGNOSIS — M25611 Stiffness of right shoulder, not elsewhere classified: Secondary | ICD-10-CM | POA: Diagnosis not present

## 2016-05-23 DIAGNOSIS — M25511 Pain in right shoulder: Secondary | ICD-10-CM | POA: Diagnosis not present

## 2016-05-23 DIAGNOSIS — M75121 Complete rotator cuff tear or rupture of right shoulder, not specified as traumatic: Secondary | ICD-10-CM | POA: Diagnosis not present

## 2016-05-28 DIAGNOSIS — M25611 Stiffness of right shoulder, not elsewhere classified: Secondary | ICD-10-CM | POA: Diagnosis not present

## 2016-05-28 DIAGNOSIS — M25511 Pain in right shoulder: Secondary | ICD-10-CM | POA: Diagnosis not present

## 2016-05-28 DIAGNOSIS — M75121 Complete rotator cuff tear or rupture of right shoulder, not specified as traumatic: Secondary | ICD-10-CM | POA: Diagnosis not present

## 2016-06-03 NOTE — Progress Notes (Signed)
Cardiology Office Note Date:  06/04/2016  Patient ID:  Amanda, Bryant 1940/10/20, MRN [id number] PCP:  Nicoletta Dress, MD  Cardiologist:  Dr. Caryl Comes   Chief Complaint:  Planned f/u  History of Present Illness: Amanda Bryant is a 75 y.o. female with history of CHB w/PPM, HTN comes to the office today to be seen for Dr. Caryl Comes, she was last seen by him in April and doing well at that time. She was seen more recently by myself at an unscheduled visit for increased LE swelling, her lasix up-titrated and echo ordered.  She comes in today for f/u.  She denies any kind of symptoms otherwise.  No CP, palpitations, no SOB, DOE, no symptoms ot PND or orthopnea.  No dizziness, near syncope or syncope.  He mentions a burning discomfort to a isolated area in her medial thing and just proximal to medial ankle.  The swelling is uncomfortable but denies claudication or pain otherwise.  She does not think that the extra 1/2 tab QOD of lasix has made any significant change at all in her edema, and over the weekend feels like she was getting winded very easily.    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 Caryl Comes   Past Medical History:  Diagnosis Date  . Complete heart block (HCC)    intermittent, documented by prev ILR  . HTN (hypertension)   . Pacemaker    Implanted 2008  . PVC's (premature ventricular contractions)   . S/P cardiac catheterization    2001- nonobstructive disease; nl lv function    No past surgical history on file.  Current Outpatient Prescriptions  Medication Sig Dispense Refill  . alendronate (FOSAMAX) 70 MG tablet Take 70 mg by mouth every 7 (seven) days. Take with a full glass of water on an empty stomach.    . Calcium Carbonate (CALTRATE 600 PO) Take by mouth daily.      . carvedilol (COREG) 6.25 MG tablet Take 6.25 mg by mouth 2 (two) times daily.    . Cholecalciferol (VITAMIN D) 1000 UNITS capsule Take 1,000 Units by mouth daily.        . fish oil-omega-3 fatty acids 1000 MG capsule Take 2 g by mouth daily.      . furosemide (LASIX) 40 MG tablet Take 40 mg by mouth 2 (two) times daily.    Marland Kitchen losartan (COZAAR) 50 MG tablet Take 50 mg by mouth daily.    Marland Kitchen omeprazole (PRILOSEC) 20 MG capsule Take 20 mg by mouth daily.      Marland Kitchen venlafaxine (EFFEXOR-XR) 37.5 MG 24 hr capsule Take 37.5 mg by mouth daily.      No current facility-administered medications for this visit.     Allergies:   Codeine; Prednisone; and Ultram [tramadol hcl]   Social History:  The patient  reports that she has never smoked. She has never used smokeless tobacco.   Family History:  The patient's family history includes Diabetes in her father and mother; Heart disease in her father and mother.  ROS:  Please see the history of present illness.  All other systems are reviewed and otherwise negative.   PHYSICAL EXAM:  VS:  BP 110/74   Pulse 70   Ht 5\' 5"  (1.651 m)   Wt 162 lb (73.5 kg)   BMI 26.96 kg/m  BMI: Body mass index is 26.96 kg/m. Well nourished, well developed, in no acute distress  HEENT: normocephalic, atraumatic  Neck: no JVD, carotid  bruits or masses Cardiac: RRR; no significant murmurs, no rubs, or gallops Lungs:  clear to auscultation bilaterally, no wheezing, rhonchi or rales  Abd: soft, nontender MS: no deformity or atrophy Ext: 1+ edema  B/l LE at the ankles today, slightly higher Skin: warm and dry, no rash Neuro:  No gross deficits appreciated Psych: euthymic mood, full affect  PPM site is stable, no tethering or discomfort   EKG:  Done today and reviewed by myself is asyncronous V pacing PPM interrogation today tripped ERI 06/02/16, and is VVI 65 05/13/16: Echocardiogram Study Conclusions - Left ventricle: The cavity size was normal. Wall thickness was   increased in a pattern of mild LVH. Systolic function was normal.   The estimated ejection fraction was in the range of 50% to 55%.   Doppler parameters are consistent  with abnormal left ventricular   relaxation (grade 1 diastolic dysfunction).  Recent Labs: 05/13/2016: BUN 18; Creat 0.90; Potassium 4.5; Sodium 142  No results found for requested labs within last 8760 hours.   CrCl cannot be calculated (Patient's most recent lab result is older than the maximum 21 days allowed.).   Wt Readings from Last 3 Encounters:  06/04/16 162 lb (73.5 kg)  05/03/16 163 lb (73.9 kg)  01/11/16 163 lb (73.9 kg)     Other studies reviewed: Additional studies/records reviewed today include: summarized above  ASSESSMENT AND PLAN:  1. Edema Unchanged, lungs clear No other symptoms Some diastolic dysfunction on her echo, will increase her lasix to 40mg  BID, check BMET today    2. CHB, PPM     Battery is at ERI, VVI 65 pacing is likely the onset of her SOB this weekend     Risks/benefits of generator change discussed with the patient, she would like to proceed  3. HTN     stable  Disposition:   We will schedule a generator change with dr. Caryl Comes ASAP given she is symptomatic with the VVI pacing, BMET, CBC today  Current medicines are reviewed at length with the patient today.  The patient did not have any concerns regarding medicines.  [name], PA-C 06/04/2016 4:06 PM     [address] [place] [address] [phone] (office)  [phone] (fax)

## 2016-06-04 ENCOUNTER — Encounter: Payer: Self-pay | Admitting: Physician Assistant

## 2016-06-04 ENCOUNTER — Ambulatory Visit (INDEPENDENT_AMBULATORY_CARE_PROVIDER_SITE_OTHER): Payer: PPO | Admitting: Physician Assistant

## 2016-06-04 VITALS — BP 110/74 | HR 70 | Ht 65.0 in | Wt 162.0 lb

## 2016-06-04 DIAGNOSIS — I442 Atrioventricular block, complete: Secondary | ICD-10-CM | POA: Diagnosis not present

## 2016-06-04 DIAGNOSIS — Z45010 Encounter for checking and testing of cardiac pacemaker pulse generator [battery]: Secondary | ICD-10-CM

## 2016-06-04 DIAGNOSIS — R600 Localized edema: Secondary | ICD-10-CM | POA: Diagnosis not present

## 2016-06-04 DIAGNOSIS — I10 Essential (primary) hypertension: Secondary | ICD-10-CM | POA: Diagnosis not present

## 2016-06-04 LAB — CBC
HCT: 40.6 % (ref 35.0–45.0)
Hemoglobin: 13.2 g/dL (ref 11.7–15.5)
MCH: 28.9 pg (ref 27.0–33.0)
MCHC: 32.5 g/dL (ref 32.0–36.0)
MCV: 88.8 fL (ref 80.0–100.0)
MPV: 11.4 fL (ref 7.5–12.5)
PLATELETS: 188 10*3/uL (ref 140–400)
RBC: 4.57 MIL/uL (ref 3.80–5.10)
RDW: 13.3 % (ref 11.0–15.0)
WBC: 7.4 10*3/uL (ref 3.8–10.8)

## 2016-06-04 LAB — BASIC METABOLIC PANEL
BUN: 18 mg/dL (ref 7–25)
CHLORIDE: 104 mmol/L (ref 98–110)
CO2: 22 mmol/L (ref 20–31)
CREATININE: 0.8 mg/dL (ref 0.60–0.93)
Calcium: 9.3 mg/dL (ref 8.6–10.4)
Glucose, Bld: 86 mg/dL (ref 65–99)
Potassium: 4.5 mmol/L (ref 3.5–5.3)
Sodium: 141 mmol/L (ref 135–146)

## 2016-06-04 NOTE — Patient Instructions (Addendum)
Medication Instructions:   STAR TAKING LASIX 40 MG  TWICE  A DAY  If you need a refill on your cardiac medications before your next appointment, please call your pharmacy.  Labwork: BMET AND CBC     Testing/Procedures: SEE INSTRUCTIONS FOR GEN CHANGE    Follow-Up:  10 DAYS AFTER 06/10/2016 WOUND CHECK WITH DEVICE CLINIC  90 DAY S AFTER 06/10/2016  WITH KLEIN PACER PHYS [facility]   Any Other Special Instructions Will Be Listed Below (If Applicable).                                                    Implantable Device Instructions     You are scheduled for:  Generator Change         on  06/10/2016  with Dr. Caryl Comes @ 7:30 AM .   1.   Please arrive at the [place] at [facility] at  5:30 AM the day of your  Procedure.        THE FRONT DESK CLERK CAN GUIDE YOU DOWN THE HALL TO            ADMITTING  2.  Do not eat or drink  AFTER MIDNIGHT the night before your procedure  3. Complete lab work on 9/5/017.  The lab at [facility] is     open from 7:30 AM to 5:00 PM.     4.  DO NOT TAKE THESE MEDICATIONS  PRIOR TO THE MORNING your procedure:  LASIX MORNING DOSE         ( YOU MAY TAKE REMAINING MEDICINES WITH WATER)  5.  Plan for an overnight stay. Bring your insurance cards and a list of you medications.  6.  Wash your chest and neck with antibacterial soap (any brand) the evening before  and the morning of your procedure.  Rinse well.

## 2016-06-05 ENCOUNTER — Other Ambulatory Visit: Payer: Self-pay | Admitting: Physician Assistant

## 2016-06-05 DIAGNOSIS — M75121 Complete rotator cuff tear or rupture of right shoulder, not specified as traumatic: Secondary | ICD-10-CM | POA: Diagnosis not present

## 2016-06-05 DIAGNOSIS — M25611 Stiffness of right shoulder, not elsewhere classified: Secondary | ICD-10-CM | POA: Diagnosis not present

## 2016-06-05 DIAGNOSIS — M25511 Pain in right shoulder: Secondary | ICD-10-CM | POA: Diagnosis not present

## 2016-06-06 ENCOUNTER — Telehealth: Payer: Self-pay | Admitting: *Deleted

## 2016-06-06 NOTE — Telephone Encounter (Signed)
-----   Message from [facility], [place] sent at 06/05/2016  4:41 AM EDT ----- Please let the patient know her labs look good for her procedure and potassium is OK.  Recheck BMET next week on increased lasix dose please, Monday.  Thanks [employer]

## 2016-06-06 NOTE — Telephone Encounter (Signed)
SPOKE TO PT ABOUT LAB RESULTS AND VERBALIZED UNDERSTANDING AND REPEAT BMET WILL BE DONE ON Monday AT HOSPITAL ON DAY OF PROCEDURE

## 2016-06-10 ENCOUNTER — Ambulatory Visit (HOSPITAL_COMMUNITY)
Admission: RE | Admit: 2016-06-10 | Discharge: 2016-06-10 | Disposition: A | Discharge status: Home / Self Care | Payer: PPO | Source: Ambulatory Visit | Attending: Internal Medicine | Admitting: Internal Medicine

## 2016-06-10 ENCOUNTER — Ambulatory Visit (HOSPITAL_COMMUNITY)
Admission: RE | Disposition: A | Discharge status: Home / Self Care | Payer: Self-pay | Source: Ambulatory Visit | Attending: Internal Medicine

## 2016-06-10 DIAGNOSIS — I442 Atrioventricular block, complete: Secondary | ICD-10-CM | POA: Diagnosis not present

## 2016-06-10 DIAGNOSIS — Z45010 Encounter for checking and testing of cardiac pacemaker pulse generator [battery]: Secondary | ICD-10-CM | POA: Insufficient documentation

## 2016-06-10 HISTORY — PX: EP IMPLANTABLE DEVICE: SHX172B

## 2016-06-10 LAB — BASIC METABOLIC PANEL
ANION GAP: 7 (ref 5–15)
BUN: 20 mg/dL (ref 6–20)
CHLORIDE: 110 mmol/L (ref 101–111)
CO2: 24 mmol/L (ref 22–32)
Calcium: 9.2 mg/dL (ref 8.9–10.3)
Creatinine, Ser: 0.81 mg/dL (ref 0.44–1.00)
GFR calc non Af Amer: >60 mL/min (ref >60)
Glucose, Bld: 103 mg/dL — ABNORMAL HIGH (ref 65–99)
POTASSIUM: 4.2 mmol/L (ref 3.5–5.1)
SODIUM: 141 mmol/L (ref 135–145)

## 2016-06-10 LAB — SURGICAL PCR SCREEN
MRSA, PCR: NEGATIVE
Staphylococcus aureus: NEGATIVE

## 2016-06-10 SURGERY — PPM/BIV PPM GENERATOR CHANGEOUT
Anesthesia: LOCAL

## 2016-06-10 MED ORDER — SODIUM CHLORIDE 0.9 % IV SOLN
INTRAVENOUS | Status: DC
  Administered 2016-06-10: 07:00:00 via INTRAVENOUS

## 2016-06-10 MED ORDER — MIDAZOLAM HCL 5 MG/5ML IJ SOLN
INTRAMUSCULAR | Status: DC | PRN
  Administered 2016-06-10 (×2): 1 mg via INTRAVENOUS

## 2016-06-10 MED ORDER — CHLORHEXIDINE GLUCONATE 4 % EX LIQD
60.0000 mL | Freq: Once | CUTANEOUS | Status: DC
  Filled 2016-06-10: qty 60

## 2016-06-10 MED ORDER — LIDOCAINE HCL (PF) 1 % IJ SOLN
INTRAMUSCULAR | Status: AC
Start: 2016-06-10 — End: 2016-06-10
  Filled 2016-06-10: qty 30

## 2016-06-10 MED ORDER — MUPIROCIN 2 % EX OINT
TOPICAL_OINTMENT | CUTANEOUS | Status: AC
  Administered 2016-06-10: 06:00:00
  Filled 2016-06-10: qty 22

## 2016-06-10 MED ORDER — MIDAZOLAM HCL 5 MG/5ML IJ SOLN
INTRAMUSCULAR | Status: AC
Start: 2016-06-10 — End: 2016-06-10
  Filled 2016-06-10: qty 5

## 2016-06-10 MED ORDER — MUPIROCIN 2 % EX OINT
TOPICAL_OINTMENT | Freq: Once | CUTANEOUS | Status: AC
Start: 2016-06-10 — End: 2016-06-10
  Administered 2016-06-10: 1 via NASAL
  Filled 2016-06-10: qty 22

## 2016-06-10 MED ORDER — CEFAZOLIN IN D5W 1 GM/50ML IV SOLN: 1.0000 g | Freq: Four times a day (QID) | INTRAVENOUS | Status: DC

## 2016-06-10 MED ORDER — SODIUM CHLORIDE 0.9 % IR SOLN
Status: AC
  Filled 2016-06-10: qty 2

## 2016-06-10 MED ORDER — SODIUM CHLORIDE 0.9 % IR SOLN
80.0000 mg | Status: AC
  Administered 2016-06-10: 80 mg

## 2016-06-10 MED ORDER — ACETAMINOPHEN 325 MG PO TABS: 325.0000 mg | ORAL_TABLET | ORAL | Status: DC | PRN

## 2016-06-10 MED ORDER — LIDOCAINE HCL (PF) 1 % IJ SOLN
INTRAMUSCULAR | Status: AC
  Filled 2016-06-10: qty 30

## 2016-06-10 MED ORDER — ONDANSETRON HCL 4 MG/2ML IJ SOLN: 4.0000 mg | Freq: Four times a day (QID) | INTRAMUSCULAR | Status: DC | PRN

## 2016-06-10 MED ORDER — FENTANYL CITRATE (PF) 100 MCG/2ML IJ SOLN
INTRAMUSCULAR | Status: AC
  Filled 2016-06-10: qty 2

## 2016-06-10 MED ORDER — FENTANYL CITRATE (PF) 100 MCG/2ML IJ SOLN
INTRAMUSCULAR | Status: DC | PRN
  Administered 2016-06-10: 25 ug via INTRAVENOUS

## 2016-06-10 MED ORDER — LIDOCAINE HCL (PF) 1 % IJ SOLN
INTRAMUSCULAR | Status: DC | PRN
  Administered 2016-06-10: 44 mL via INTRADERMAL

## 2016-06-10 MED ORDER — CEFAZOLIN SODIUM-DEXTROSE 2-4 GM/100ML-% IV SOLN
INTRAVENOUS | Status: AC
  Filled 2016-06-10: qty 100

## 2016-06-10 MED ORDER — CEFAZOLIN SODIUM-DEXTROSE 2-4 GM/100ML-% IV SOLN
2.0000 g | INTRAVENOUS | Status: AC
  Administered 2016-06-10: 2 g via INTRAVENOUS

## 2016-06-10 SURGICAL SUPPLY — 6 items
CABLE SURGICAL S-101-97-12 (CABLE) ×1 IMPLANT
DEVICE DISSECT PLASMABLAD 3.0S (MISCELLANEOUS) IMPLANT
PAD DEFIB LIFELINK (PAD) ×1 IMPLANT
PLASMABLADE 3.0S (MISCELLANEOUS) ×2
PPM ADVISA MRI DR A2DR01 (Pacemaker) ×1 IMPLANT
TRAY PACEMAKER INSERTION (PACKS) ×1 IMPLANT

## 2016-06-10 NOTE — H&P (Signed)
Amanda Bryant presents today for workup of her pacemaker.  She has had a pacemaker for 9 years due to complete AV block.  Presenting as her PPM went ERI.  She [name] have generator change done today.  Regular rhythm, lungs clear, no murmurs. Risks and benefits explained.  Risk include bleeding and infection.  She and her husband understand the risks and have agreed to the procedure.    [name] Curt Bears, MD 06/10/2016 7:11 AM

## 2016-06-10 NOTE — Discharge Instructions (Signed)

## 2016-06-11 ENCOUNTER — Encounter (HOSPITAL_COMMUNITY): Payer: Self-pay | Admitting: Cardiology

## 2016-06-11 MED FILL — Gentamicin Sulfate Inj 40 MG/ML: INTRAMUSCULAR | Qty: 80 | Status: AC

## 2016-06-12 MED FILL — Cefazolin Sodium-Dextrose IV Solution 2 GM/100ML-4%: INTRAVENOUS | Qty: 100 | Status: AC

## 2016-06-19 ENCOUNTER — Ambulatory Visit (INDEPENDENT_AMBULATORY_CARE_PROVIDER_SITE_OTHER): Payer: PPO | Admitting: Sports Medicine

## 2016-06-19 ENCOUNTER — Encounter: Payer: Self-pay | Admitting: Sports Medicine

## 2016-06-19 DIAGNOSIS — L603 Nail dystrophy: Secondary | ICD-10-CM | POA: Diagnosis not present

## 2016-06-19 DIAGNOSIS — M79674 Pain in right toe(s): Secondary | ICD-10-CM

## 2016-06-19 DIAGNOSIS — B351 Tinea unguium: Secondary | ICD-10-CM | POA: Diagnosis not present

## 2016-06-19 DIAGNOSIS — M79675 Pain in left toe(s): Secondary | ICD-10-CM

## 2016-06-19 DIAGNOSIS — R7303 Prediabetes: Secondary | ICD-10-CM

## 2016-06-19 DIAGNOSIS — M722 Plantar fascial fibromatosis: Secondary | ICD-10-CM | POA: Diagnosis not present

## 2016-06-19 DIAGNOSIS — L601 Onycholysis: Secondary | ICD-10-CM | POA: Diagnosis not present

## 2016-06-19 NOTE — Progress Notes (Signed)
Subjective: Amanda Bryant is a 75 y.o. female patient seen today in office with complaint of painful thickened and discolored nails. Patient is desiring treatment for nail changes; has not tried OTC medication. Reports that she gets pedicures and was told that its a fungus. Reports that nails are becoming difficult to manage because of the thickness. States also that sometimes she has right heel pain and has not done anything for it. Patient has no other pedal complaints at this time.   Patient Active Problem List   Diagnosis Date Noted  . Complete heart block ([place]) 06/10/2016  . 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 Pacemaker-Medtronic   . HTN (hypertension)   . PVC's (premature ventricular contractions)     Current Outpatient Prescriptions on File Prior to Visit  Medication Sig Dispense Refill  . acetaminophen (TYLENOL) 500 MG tablet Take 500 mg by mouth every 6 (six) hours as needed for mild pain.    Marland Kitchen alendronate (FOSAMAX) 70 MG tablet Take 70 mg by mouth every 7 (seven) days. Take with a full glass of water on an empty stomach.    Marland Kitchen aspirin EC 81 MG tablet Take 81 mg by mouth daily as needed for mild pain.    . Calcium Carbonate (CALTRATE 600 PO) Take 1 tablet by mouth daily.     . carvedilol (COREG) 6.25 MG tablet Take 6.25 mg by mouth 2 (two) times daily.    . Cholecalciferol (VITAMIN D) 1000 UNITS capsule Take 1,000 Units by mouth daily.      . fish oil-omega-3 fatty acids 1000 MG capsule Take 2 g by mouth daily.      . furosemide (LASIX) 40 MG tablet Take 40 mg by mouth 2 (two) times daily.    Marland Kitchen losartan (COZAAR) 50 MG tablet Take 50 mg by mouth daily.    Marland Kitchen omeprazole (PRILOSEC) 20 MG capsule Take 20 mg by mouth daily.      Marland Kitchen venlafaxine (EFFEXOR-XR) 37.5 MG 24 hr capsule Take 37.5 mg by mouth daily.      No current facility-administered medications on file prior to visit.     Allergies  Allergen Reactions  . Codeine Nausea Only  . Prednisone Other (See  Comments)    Reaction: Increased heart rate  . Ultram [Tramadol Hcl] Itching  . Tape     Pulls skin off     Objective: Physical Exam  General: Well developed, nourished, no acute distress, awake, alert and oriented x 3  Vascular: Dorsalis pedis artery 2/4 bilateral, Posterior tibial artery 1/4 bilateral, skin temperature warm to warm proximal to distal bilateral lower extremities, no varicosities, pedal hair present bilateral.  Neurological: Gross sensation present via light touch bilateral.   Dermatological: Skin is warm, dry, and supple bilateral, Old surgical scars--well healed, Nails 1-5 on right and 2-5 on left are tender, short thick, and discolored with mild subungal debris, there is distal lifting of right hallux nail, There is no nail on left hallux (previously permanently removed), no webspace macerations present bilateral, no open lesions present bilateral, no callus/corns/hyperkeratotic tissue present bilateral. No signs of infection bilateral.  Musculoskeletal: Minimal tenderness to insertion of plantar fasica on right, Fat pad atrophy, Hammertoe/Digital deformity bilateral. Muscular strength within normal limits without painon range of motion. No pain with calf compression bilateral.  Assessment and Plan:  Problem List Items Addressed This Visit    None    Visit Diagnoses    Nail dystrophy    -  Primary  Relevant Orders   Fungus Culture with Smear   Toe pain, bilateral       Plantar fasciitis of right foot       Pre-diabetes         -Examined patient -Recommend stretching, icing, and good supportive shoes for right heel pain; If fails to improve will treat more aggressive with medication/injection therapy  -Discussed treatment options for painful dystrophic nails  -Fungal culture was obtained and sent to [facility] lab -Patient to return in 4 weeks for follow up evaluation and discussion of fungal culture results or sooner if symptoms worsen.  Landis Martins, DPM

## 2016-06-19 NOTE — Patient Instructions (Signed)

## 2016-06-20 ENCOUNTER — Ambulatory Visit (INDEPENDENT_AMBULATORY_CARE_PROVIDER_SITE_OTHER): Payer: PPO | Admitting: *Deleted

## 2016-06-20 DIAGNOSIS — I442 Atrioventricular block, complete: Secondary | ICD-10-CM | POA: Diagnosis not present

## 2016-06-20 LAB — CUP PACEART INCLINIC DEVICE CHECK
Battery Voltage: 3.13 V
Brady Statistic AP VS Percent: 0 %
Brady Statistic RA Percent Paced: 0.04 %
Brady Statistic RV Percent Paced: 100 %
Date Time Interrogation Session: 20170921131950
Implantable Lead Implant Date: 20081014
Implantable Lead Implant Date: 20081014
Implantable Lead Location: 753860
Implantable Lead Model: 5076
Implantable Lead Model: 5076
Lead Channel Impedance Value: 456 Ohm
Lead Channel Impedance Value: 665 Ohm
Lead Channel Pacing Threshold Pulse Width: 0.4 ms
Lead Channel Sensing Intrinsic Amplitude: 2.875 mV
MDC IDC LEAD LOCATION: 753859
MDC IDC MSMT LEADCHNL RA IMPEDANCE VALUE: 532 Ohm
MDC IDC MSMT LEADCHNL RA PACING THRESHOLD AMPLITUDE: 0.5 V
MDC IDC MSMT LEADCHNL RV IMPEDANCE VALUE: 532 Ohm
MDC IDC MSMT LEADCHNL RV PACING THRESHOLD AMPLITUDE: 0.75 V
MDC IDC MSMT LEADCHNL RV PACING THRESHOLD PULSEWIDTH: 0.4 ms
MDC IDC SET LEADCHNL RA PACING AMPLITUDE: 2 V
MDC IDC SET LEADCHNL RV PACING AMPLITUDE: 2.5 V
MDC IDC SET LEADCHNL RV PACING PULSEWIDTH: 0.4 ms
MDC IDC SET LEADCHNL RV SENSING SENSITIVITY: 5.6 mV
MDC IDC STAT BRADY AP VP PERCENT: 0.04 %
MDC IDC STAT BRADY AS VP PERCENT: 99.96 %
MDC IDC STAT BRADY AS VS PERCENT: 0 %

## 2016-06-20 NOTE — Progress Notes (Signed)
Pacemaker check in clinic. Normal device function. Thresholds, sensing, impedances consistent with previous measurements. Device programmed to maximize longevity. No mode switch or high ventricular rates noted. Device programmed at appropriate safety margins. Histogram distribution appropriate for patient activity level. Device programmed to optimize intrinsic conduction. Battery voltage 3.13V. ROV with Sk 12/12

## 2016-07-17 ENCOUNTER — Ambulatory Visit (INDEPENDENT_AMBULATORY_CARE_PROVIDER_SITE_OTHER): Payer: PPO | Admitting: Sports Medicine

## 2016-07-17 ENCOUNTER — Encounter: Payer: Self-pay | Admitting: Sports Medicine

## 2016-07-17 DIAGNOSIS — L603 Nail dystrophy: Secondary | ICD-10-CM | POA: Diagnosis not present

## 2016-07-17 DIAGNOSIS — M79674 Pain in right toe(s): Secondary | ICD-10-CM | POA: Diagnosis not present

## 2016-07-17 DIAGNOSIS — Q828 Other specified congenital malformations of skin: Secondary | ICD-10-CM | POA: Diagnosis not present

## 2016-07-17 DIAGNOSIS — M79675 Pain in left toe(s): Secondary | ICD-10-CM | POA: Diagnosis not present

## 2016-07-17 DIAGNOSIS — M722 Plantar fascial fibromatosis: Secondary | ICD-10-CM

## 2016-07-17 MED ORDER — TRIAMCINOLONE ACETONIDE 40 MG/ML IJ SUSP: 20.0000 mg | Freq: Once | INTRAMUSCULAR | Status: DC

## 2016-07-17 NOTE — Progress Notes (Signed)
Subjective: Amanda Bryant is a 75 y.o. female patient seen today in office for discussion of fungal culture results and follow-up evaluation on right heel pain with hard calluses on the bottom of both feet. Patient states that she has been doing her stretching for her heel pain but still has pain with first few steps out of bed in the morning and very pinpoint pain to the heart lesion that is on the plantar surface of her right heel and the plantar surface of her left forefoot. Patient also reports that she has been using tea tree oil to nail with improvement in appearance. Patient has no other pedal complaints at this time.   Patient Active Problem List   Diagnosis Date Noted  . Complete heart block ([place]) 06/10/2016  . 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 Pacemaker-Medtronic   . HTN (hypertension)   . PVC's (premature ventricular contractions)     Current Outpatient Prescriptions on File Prior to Visit  Medication Sig Dispense Refill  . acetaminophen (TYLENOL) 500 MG tablet Take 500 mg by mouth every 6 (six) hours as needed for mild pain.    Marland Kitchen alendronate (FOSAMAX) 70 MG tablet Take 70 mg by mouth every 7 (seven) days. Take with a full glass of water on an empty stomach.    Marland Kitchen aspirin EC 81 MG tablet Take 81 mg by mouth daily as needed for mild pain.    . Calcium Carbonate (CALTRATE 600 PO) Take 1 tablet by mouth daily.     . carvedilol (COREG) 6.25 MG tablet Take 6.25 mg by mouth 2 (two) times daily.    . Cholecalciferol (VITAMIN D) 1000 UNITS capsule Take 1,000 Units by mouth daily.      . fish oil-omega-3 fatty acids 1000 MG capsule Take 2 g by mouth daily.      . furosemide (LASIX) 40 MG tablet Take 40 mg by mouth 2 (two) times daily.    Marland Kitchen losartan (COZAAR) 50 MG tablet Take 50 mg by mouth daily.    Marland Kitchen omeprazole (PRILOSEC) 20 MG capsule Take 20 mg by mouth daily.      Marland Kitchen venlafaxine (EFFEXOR-XR) 37.5 MG 24 hr capsule Take 37.5 mg by mouth daily.      No current  facility-administered medications on file prior to visit.     Allergies  Allergen Reactions  . Codeine Nausea Only  . Prednisone Other (See Comments)    Reaction: Increased heart rate  . Ultram [Tramadol Hcl] Itching  . Tape     Pulls skin off     Objective: Physical Exam  General: Well developed, nourished, no acute distress, awake, alert and oriented x 3  Vascular: Dorsalis pedis artery 2/4 bilateral, Posterior tibial artery 1/4 bilateral, skin temperature warm to warm proximal to distal bilateral lower extremities, no varicosities, pedal hair present bilateral.  Neurological: Gross sensation present via light touch bilateral.   Dermatological: Skin is warm, dry, and supple bilateral, Old surgical scars--well healed, Nails 1-5 on right and 2-5 on left are tender, short thick, and discolored with mild subungal debris, there is distal lifting of right hallux nail, There is no nail on left hallux (previously permanently removed), no webspace macerations present bilateral, no open lesions present bilateral, there is hyperkeratotic tissue present with a central nucleated core left sub-met 4 and right plantar central heel. No signs of infection bilateral.  Musculoskeletal: Minimal tenderness to insertion of plantar fasica on right, Fat pad atrophy, Hammertoe/Digital deformity bilateral. Muscular strength within normal  limits without pain on range of motion. No pain with calf compression bilateral.  Fungal culture + saph mold  Assessment and Plan:  Problem List Items Addressed This Visit    None    Visit Diagnoses    Nail dystrophy    -  Primary   Fungal culture positive for mold   Toe pain, bilateral       Porokeratosis       Right heel and left sub-met 4   Plantar fasciitis of right foot       Relevant Medications   triamcinolone acetonide (KENALOG-40) injection 20 mg     -Examined patient -Fungal culture results consistent with mold; discussed treatment options. Patient  decided to continue with tea tree oil at this time -Mechanically debrided using a sterile chisel blade callus at right heel and left sub-met 4 without incident -After oral consent and aseptic prep, injected a mixture containing 1 ml of 2%  plain lidocaine, 1 ml 0.5% plain marcaine, 0.5 ml of kenalog 40 and 0.5 ml of dexamethasone phosphate into right plantar heel without complication. Post-injection care discussed with patient.  -Recommend stretching, icing, and good supportive shoes -Patient to return as needed or sooner if symptoms worsen.  Landis Martins, DPM

## 2016-07-24 DIAGNOSIS — I10 Essential (primary) hypertension: Secondary | ICD-10-CM | POA: Diagnosis not present

## 2016-07-24 DIAGNOSIS — Z23 Encounter for immunization: Secondary | ICD-10-CM | POA: Diagnosis not present

## 2016-07-24 DIAGNOSIS — E559 Vitamin D deficiency, unspecified: Secondary | ICD-10-CM | POA: Diagnosis not present

## 2016-07-24 DIAGNOSIS — R7301 Impaired fasting glucose: Secondary | ICD-10-CM | POA: Diagnosis not present

## 2016-07-24 DIAGNOSIS — Z6826 Body mass index (BMI) 26.0-26.9, adult: Secondary | ICD-10-CM | POA: Diagnosis not present

## 2016-07-24 DIAGNOSIS — F418 Other specified anxiety disorders: Secondary | ICD-10-CM | POA: Diagnosis not present

## 2016-07-24 DIAGNOSIS — R600 Localized edema: Secondary | ICD-10-CM | POA: Diagnosis not present

## 2016-07-24 DIAGNOSIS — M810 Age-related osteoporosis without current pathological fracture: Secondary | ICD-10-CM | POA: Diagnosis not present

## 2016-07-24 DIAGNOSIS — E785 Hyperlipidemia, unspecified: Secondary | ICD-10-CM | POA: Diagnosis not present

## 2016-08-05 DIAGNOSIS — M75121 Complete rotator cuff tear or rupture of right shoulder, not specified as traumatic: Secondary | ICD-10-CM | POA: Diagnosis not present

## 2016-08-29 DIAGNOSIS — Z6827 Body mass index (BMI) 27.0-27.9, adult: Secondary | ICD-10-CM | POA: Diagnosis not present

## 2016-08-29 DIAGNOSIS — J208 Acute bronchitis due to other specified organisms: Secondary | ICD-10-CM | POA: Diagnosis not present

## 2016-09-04 ENCOUNTER — Encounter: Payer: Self-pay | Admitting: Internal Medicine

## 2016-09-10 ENCOUNTER — Encounter: Payer: Self-pay | Admitting: Internal Medicine

## 2016-09-10 ENCOUNTER — Encounter (INDEPENDENT_AMBULATORY_CARE_PROVIDER_SITE_OTHER): Payer: Self-pay

## 2016-09-10 ENCOUNTER — Ambulatory Visit (INDEPENDENT_AMBULATORY_CARE_PROVIDER_SITE_OTHER): Payer: PPO | Admitting: Internal Medicine

## 2016-09-10 VITALS — BP 110/60 | HR 72 | Ht 65.0 in | Wt 158.0 lb

## 2016-09-10 DIAGNOSIS — I10 Essential (primary) hypertension: Secondary | ICD-10-CM | POA: Diagnosis not present

## 2016-09-10 DIAGNOSIS — I442 Atrioventricular block, complete: Secondary | ICD-10-CM

## 2016-09-10 DIAGNOSIS — Z950 Presence of cardiac pacemaker: Secondary | ICD-10-CM | POA: Diagnosis not present

## 2016-09-10 LAB — CUP PACEART INCLINIC DEVICE CHECK
Battery Remaining Longevity: 128 mo
Battery Voltage: 3.06 V
Brady Statistic AP VP Percent: 0.17 %
Brady Statistic AP VS Percent: 0 %
Brady Statistic AS VP Percent: 99.82 %
Brady Statistic RA Percent Paced: 0.17 %
Brady Statistic RV Percent Paced: 99.98 %
Implantable Lead Implant Date: 20081014
Implantable Lead Location: 753860
Implantable Lead Model: 5076
Implantable Lead Model: 5076
Implantable Pulse Generator Implant Date: 20170911
Lead Channel Impedance Value: 532 Ohm
Lead Channel Impedance Value: 665 Ohm
Lead Channel Pacing Threshold Amplitude: 0.5 V
Lead Channel Pacing Threshold Pulse Width: 0.4 ms
Lead Channel Sensing Intrinsic Amplitude: 14.125 mV
Lead Channel Sensing Intrinsic Amplitude: 2.125 mV
Lead Channel Setting Pacing Amplitude: 2 V
Lead Channel Setting Pacing Amplitude: 2.5 V
Lead Channel Setting Sensing Sensitivity: 5.6 mV
MDC IDC LEAD IMPLANT DT: 20081014
MDC IDC LEAD LOCATION: 753859
MDC IDC MSMT LEADCHNL RA IMPEDANCE VALUE: 456 Ohm
MDC IDC MSMT LEADCHNL RA IMPEDANCE VALUE: 532 Ohm
MDC IDC MSMT LEADCHNL RV PACING THRESHOLD AMPLITUDE: 0.75 V
MDC IDC MSMT LEADCHNL RV PACING THRESHOLD PULSEWIDTH: 0.4 ms
MDC IDC SESS DTM: 20171212171124
MDC IDC SET LEADCHNL RV PACING PULSEWIDTH: 0.4 ms
MDC IDC STAT BRADY AS VS PERCENT: 0.01 %

## 2016-09-10 NOTE — Patient Instructions (Signed)
Medication Instructions: - Your physician recommends that you continue on your current medications as directed. Please refer to the Current Medication list given to you today.  Labwork: - none ordered  Procedures/Testing: - none ordered  Follow-Up: - Remote monitoring is used to monitor your Pacemaker of ICD from home. This monitoring reduces the number of office visits required to check your device to one time per year. It allows [place] to keep an eye on the functioning of your device to ensure it is working properly. You are scheduled for a device check from home on 12/10/16. You may send your transmission at any time that day. If you have a wireless device, the transmission will be sent automatically. After your physician reviews your transmission, you will receive a postcard with your next transmission date.  - Your physician wants you to follow-up in: 9 months with Dr. Caryl Comes. You will receive a reminder letter in the mail two months in advance. If you don't receive a letter, please call our office to schedule the follow-up appointment.  Any Additional Special Instructions Will Be Listed Below (If Applicable).     If you need a refill on your cardiac medications before your next appointment, please call your pharmacy.

## 2016-09-10 NOTE — Progress Notes (Signed)
Patient Care Team: Nicoletta Dress, MD as PCP - General (Internal Medicine)   HPI  Amanda Bryant is a 75 y.o. female Seen in followup for syncope which turned out to be associated by intermittent complete heart block documented by loop recorder insertion 2008 and subsequently replaced by a dual-chamber pacemaker-Medtronic also in 2008. She's had no recurrent syncope.  The patient denies chest pain, shortness of breath, nocturnal dyspnea, orthopnea or peripheral edema. There have been no palpitations, lightheadedness or syncope.    Her pacemaker reached ERI and reverted to VVI 9/17 and she underwent generator replacement. The associated shortness of breath resolved.  Past Medical History:  Diagnosis Date  . Complete heart block (HCC)    intermittent, documented by prev ILR  . HTN (hypertension)   . Pacemaker    Implanted 2008  . PVC's (premature ventricular contractions)   . S/P cardiac catheterization    2001- nonobstructive disease; nl lv function    Past Surgical History:  Procedure Laterality Date  . EP IMPLANTABLE DEVICE N/A 06/10/2016   Procedure: PPM Generator Changeout;  Surgeon: Will Meredith Leeds, MD;  Location: [facility];  Service: Cardiovascular;  Laterality: N/A;    Current Outpatient Prescriptions  Medication Sig Dispense Refill  . acetaminophen (TYLENOL) 500 MG tablet Take 500 mg by mouth every 6 (six) hours as needed for mild pain.    Marland Kitchen alendronate (FOSAMAX) 70 MG tablet Take 70 mg by mouth every 7 (seven) days. Take with a full glass of water on an empty stomach.    Marland Kitchen aspirin EC 81 MG tablet Take 81 mg by mouth daily as needed for mild pain.    . Calcium Carbonate (CALTRATE 600 PO) Take 1 tablet by mouth daily.     . carvedilol (COREG) 6.25 MG tablet Take 6.25 mg by mouth 2 (two) times daily.    . Cholecalciferol (VITAMIN D) 1000 UNITS capsule Take 1,000 Units by mouth daily.      . fish oil-omega-3 fatty acids 1000 MG capsule Take 2 g by  mouth daily.      . furosemide (LASIX) 40 MG tablet Take 40 mg by mouth 2 (two) times daily.    Marland Kitchen losartan (COZAAR) 50 MG tablet Take 50 mg by mouth daily.    Marland Kitchen omeprazole (PRILOSEC) 20 MG capsule Take 20 mg by mouth daily.      Marland Kitchen venlafaxine (EFFEXOR-XR) 37.5 MG 24 hr capsule Take 37.5 mg by mouth daily.      Current Facility-Administered Medications  Medication Dose Route Frequency Provider Last Rate Last Dose  . triamcinolone acetonide (KENALOG-40) injection 20 mg  20 mg Other Once Landis Martins, DPM        Allergies  Allergen Reactions  . Codeine Nausea Only  . Prednisone Other (See Comments)    Reaction: Increased heart rate  . Ultram [Tramadol Hcl] Itching  . Tape     Pulls skin off     Review of Systems negative except from HPI and PMH  Physical Exam BP 110/60   Pulse 72   Ht 5\' 5"  (1.651 m)   Wt 158 lb (71.7 kg)   SpO2 90%   BMI 26.29 kg/m  Well developed and well nourished in no acute distress HENT normal E scleral and icterus clear Neck Supple JVP flat; carotids brisk and full Clear to ausculation  Regular rate and rhythm,2/6 murmur Soft with active bowel sounds No clubbing cyanosis  Edema Alert and oriented, grossly  normal motor and sensory function Skin Warm and Dry  ECG demonstrates P. synchronous pacing  Assessment and  Plan  Complete heart block  Stable   Pacemaker-Medtronic The patient's device was interrogated.  The information was reviewed. No changes were made in the programming.    Hypertension    Syncope   Blood pressure is well-controlled. No recurrent syncope.

## 2016-11-18 DIAGNOSIS — Z1389 Encounter for screening for other disorder: Secondary | ICD-10-CM | POA: Diagnosis not present

## 2016-11-18 DIAGNOSIS — R600 Localized edema: Secondary | ICD-10-CM | POA: Diagnosis not present

## 2016-11-18 DIAGNOSIS — F418 Other specified anxiety disorders: Secondary | ICD-10-CM | POA: Diagnosis not present

## 2016-11-18 DIAGNOSIS — M810 Age-related osteoporosis without current pathological fracture: Secondary | ICD-10-CM | POA: Diagnosis not present

## 2016-11-18 DIAGNOSIS — E785 Hyperlipidemia, unspecified: Secondary | ICD-10-CM | POA: Diagnosis not present

## 2016-11-18 DIAGNOSIS — E559 Vitamin D deficiency, unspecified: Secondary | ICD-10-CM | POA: Diagnosis not present

## 2016-11-18 DIAGNOSIS — R7301 Impaired fasting glucose: Secondary | ICD-10-CM | POA: Diagnosis not present

## 2016-11-18 DIAGNOSIS — Z1231 Encounter for screening mammogram for malignant neoplasm of breast: Secondary | ICD-10-CM | POA: Diagnosis not present

## 2016-11-18 DIAGNOSIS — I10 Essential (primary) hypertension: Secondary | ICD-10-CM | POA: Diagnosis not present

## 2016-11-20 DIAGNOSIS — M75121 Complete rotator cuff tear or rupture of right shoulder, not specified as traumatic: Secondary | ICD-10-CM | POA: Diagnosis not present

## 2016-11-20 DIAGNOSIS — M7551 Bursitis of right shoulder: Secondary | ICD-10-CM | POA: Diagnosis not present

## 2016-11-27 DIAGNOSIS — Z1231 Encounter for screening mammogram for malignant neoplasm of breast: Secondary | ICD-10-CM | POA: Diagnosis not present

## 2016-12-07 DIAGNOSIS — M1712 Unilateral primary osteoarthritis, left knee: Secondary | ICD-10-CM | POA: Diagnosis not present

## 2016-12-07 DIAGNOSIS — J208 Acute bronchitis due to other specified organisms: Secondary | ICD-10-CM | POA: Diagnosis not present

## 2016-12-10 ENCOUNTER — Encounter: Payer: PPO | Admitting: *Deleted

## 2016-12-10 ENCOUNTER — Telehealth: Payer: Self-pay | Admitting: Cardiology

## 2016-12-10 NOTE — Telephone Encounter (Signed)
Spoke with pt and reminded pt of remote transmission that is due today. Pt verbalized understanding.   

## 2016-12-13 ENCOUNTER — Encounter: Payer: Self-pay | Admitting: Cardiology

## 2016-12-18 ENCOUNTER — Ambulatory Visit (INDEPENDENT_AMBULATORY_CARE_PROVIDER_SITE_OTHER): Payer: PPO | Admitting: *Deleted

## 2016-12-18 DIAGNOSIS — I442 Atrioventricular block, complete: Secondary | ICD-10-CM | POA: Diagnosis not present

## 2016-12-19 ENCOUNTER — Encounter: Payer: Self-pay | Admitting: Cardiology

## 2016-12-19 NOTE — Progress Notes (Signed)
Remote pacemaker transmission.   

## 2016-12-21 LAB — CUP PACEART REMOTE DEVICE CHECK
Battery Remaining Longevity: 113 mo
Battery Voltage: 3.03 V
Brady Statistic AS VS Percent: 0.01 %
Brady Statistic RA Percent Paced: 0.54 %
Date Time Interrogation Session: 20180321161203
Implantable Lead Implant Date: 20081014
Implantable Lead Location: 753860
Implantable Pulse Generator Implant Date: 20170911
Lead Channel Impedance Value: 437 Ohm
Lead Channel Impedance Value: 513 Ohm
Lead Channel Pacing Threshold Amplitude: 0.5 V
Lead Channel Pacing Threshold Amplitude: 0.75 V
Lead Channel Pacing Threshold Pulse Width: 0.4 ms
Lead Channel Pacing Threshold Pulse Width: 0.4 ms
Lead Channel Sensing Intrinsic Amplitude: 1.625 mV
Lead Channel Sensing Intrinsic Amplitude: 1.625 mV
MDC IDC LEAD IMPLANT DT: 20081014
MDC IDC LEAD LOCATION: 753859
MDC IDC MSMT LEADCHNL RV IMPEDANCE VALUE: 513 Ohm
MDC IDC MSMT LEADCHNL RV IMPEDANCE VALUE: 627 Ohm
MDC IDC MSMT LEADCHNL RV SENSING INTR AMPL: 11.5 mV
MDC IDC MSMT LEADCHNL RV SENSING INTR AMPL: 11.5 mV
MDC IDC SET LEADCHNL RA PACING AMPLITUDE: 2 V
MDC IDC SET LEADCHNL RV PACING AMPLITUDE: 2.5 V
MDC IDC SET LEADCHNL RV PACING PULSEWIDTH: 0.4 ms
MDC IDC SET LEADCHNL RV SENSING SENSITIVITY: 5.6 mV
MDC IDC STAT BRADY AP VP PERCENT: 0.54 %
MDC IDC STAT BRADY AP VS PERCENT: 0 %
MDC IDC STAT BRADY AS VP PERCENT: 99.45 %
MDC IDC STAT BRADY RV PERCENT PACED: 99.99 %

## 2017-03-10 DIAGNOSIS — L820 Inflamed seborrheic keratosis: Secondary | ICD-10-CM | POA: Diagnosis not present

## 2017-03-10 DIAGNOSIS — L578 Other skin changes due to chronic exposure to nonionizing radiation: Secondary | ICD-10-CM | POA: Diagnosis not present

## 2017-03-10 DIAGNOSIS — I781 Nevus, non-neoplastic: Secondary | ICD-10-CM | POA: Diagnosis not present

## 2017-03-10 DIAGNOSIS — L821 Other seborrheic keratosis: Secondary | ICD-10-CM | POA: Diagnosis not present

## 2017-03-10 DIAGNOSIS — L570 Actinic keratosis: Secondary | ICD-10-CM | POA: Diagnosis not present

## 2017-03-17 DIAGNOSIS — R600 Localized edema: Secondary | ICD-10-CM | POA: Diagnosis not present

## 2017-03-17 DIAGNOSIS — R7301 Impaired fasting glucose: Secondary | ICD-10-CM | POA: Diagnosis not present

## 2017-03-17 DIAGNOSIS — Z9181 History of falling: Secondary | ICD-10-CM | POA: Diagnosis not present

## 2017-03-17 DIAGNOSIS — G2581 Restless legs syndrome: Secondary | ICD-10-CM | POA: Diagnosis not present

## 2017-03-17 DIAGNOSIS — Z139 Encounter for screening, unspecified: Secondary | ICD-10-CM | POA: Diagnosis not present

## 2017-03-17 DIAGNOSIS — E559 Vitamin D deficiency, unspecified: Secondary | ICD-10-CM | POA: Diagnosis not present

## 2017-03-17 DIAGNOSIS — E785 Hyperlipidemia, unspecified: Secondary | ICD-10-CM | POA: Diagnosis not present

## 2017-03-17 DIAGNOSIS — K219 Gastro-esophageal reflux disease without esophagitis: Secondary | ICD-10-CM | POA: Diagnosis not present

## 2017-03-17 DIAGNOSIS — F418 Other specified anxiety disorders: Secondary | ICD-10-CM | POA: Diagnosis not present

## 2017-03-17 DIAGNOSIS — M810 Age-related osteoporosis without current pathological fracture: Secondary | ICD-10-CM | POA: Diagnosis not present

## 2017-03-17 DIAGNOSIS — I10 Essential (primary) hypertension: Secondary | ICD-10-CM | POA: Diagnosis not present

## 2017-03-20 ENCOUNTER — Encounter: Payer: PPO | Admitting: *Deleted

## 2017-03-20 ENCOUNTER — Telehealth: Payer: Self-pay | Admitting: Internal Medicine

## 2017-03-20 NOTE — Telephone Encounter (Signed)
Error code [id number], unable to troubleshoot. Patient given [employer] support number to call.

## 2017-03-20 NOTE — Telephone Encounter (Signed)
New message     Pt is calling for help with transmission

## 2017-03-21 ENCOUNTER — Encounter: Payer: Self-pay | Admitting: Cardiology

## 2017-05-19 DIAGNOSIS — Z0000 Encounter for general adult medical examination without abnormal findings: Secondary | ICD-10-CM | POA: Diagnosis not present

## 2017-05-19 DIAGNOSIS — Z1389 Encounter for screening for other disorder: Secondary | ICD-10-CM | POA: Diagnosis not present

## 2017-05-19 DIAGNOSIS — Z9181 History of falling: Secondary | ICD-10-CM | POA: Diagnosis not present

## 2017-05-19 DIAGNOSIS — E785 Hyperlipidemia, unspecified: Secondary | ICD-10-CM | POA: Diagnosis not present

## 2017-06-26 DIAGNOSIS — I10 Essential (primary) hypertension: Secondary | ICD-10-CM | POA: Diagnosis not present

## 2017-06-26 DIAGNOSIS — K219 Gastro-esophageal reflux disease without esophagitis: Secondary | ICD-10-CM | POA: Diagnosis not present

## 2017-06-26 DIAGNOSIS — G2581 Restless legs syndrome: Secondary | ICD-10-CM | POA: Diagnosis not present

## 2017-06-26 DIAGNOSIS — E559 Vitamin D deficiency, unspecified: Secondary | ICD-10-CM | POA: Diagnosis not present

## 2017-06-26 DIAGNOSIS — R7301 Impaired fasting glucose: Secondary | ICD-10-CM | POA: Diagnosis not present

## 2017-06-26 DIAGNOSIS — M810 Age-related osteoporosis without current pathological fracture: Secondary | ICD-10-CM | POA: Diagnosis not present

## 2017-06-26 DIAGNOSIS — R600 Localized edema: Secondary | ICD-10-CM | POA: Diagnosis not present

## 2017-06-26 DIAGNOSIS — E785 Hyperlipidemia, unspecified: Secondary | ICD-10-CM | POA: Diagnosis not present

## 2017-06-26 DIAGNOSIS — F418 Other specified anxiety disorders: Secondary | ICD-10-CM | POA: Diagnosis not present

## 2017-06-28 DIAGNOSIS — M5441 Lumbago with sciatica, right side: Secondary | ICD-10-CM | POA: Diagnosis not present

## 2017-06-28 DIAGNOSIS — Z981 Arthrodesis status: Secondary | ICD-10-CM | POA: Diagnosis not present

## 2017-07-02 DIAGNOSIS — M5441 Lumbago with sciatica, right side: Secondary | ICD-10-CM | POA: Diagnosis not present

## 2017-07-07 ENCOUNTER — Encounter: Payer: Self-pay | Admitting: Internal Medicine

## 2017-07-14 DIAGNOSIS — L578 Other skin changes due to chronic exposure to nonionizing radiation: Secondary | ICD-10-CM | POA: Diagnosis not present

## 2017-07-14 DIAGNOSIS — L820 Inflamed seborrheic keratosis: Secondary | ICD-10-CM | POA: Diagnosis not present

## 2017-07-14 DIAGNOSIS — L570 Actinic keratosis: Secondary | ICD-10-CM | POA: Diagnosis not present

## 2017-07-14 DIAGNOSIS — L821 Other seborrheic keratosis: Secondary | ICD-10-CM | POA: Diagnosis not present

## 2017-07-17 ENCOUNTER — Ambulatory Visit (INDEPENDENT_AMBULATORY_CARE_PROVIDER_SITE_OTHER): Payer: PPO | Admitting: Internal Medicine

## 2017-07-17 ENCOUNTER — Encounter: Payer: Self-pay | Admitting: Internal Medicine

## 2017-07-17 VITALS — BP 136/68 | HR 66 | Ht 65.5 in | Wt 162.4 lb

## 2017-07-17 DIAGNOSIS — Z950 Presence of cardiac pacemaker: Secondary | ICD-10-CM

## 2017-07-17 DIAGNOSIS — I442 Atrioventricular block, complete: Secondary | ICD-10-CM | POA: Diagnosis not present

## 2017-07-17 LAB — CUP PACEART INCLINIC DEVICE CHECK
Battery Voltage: 3.02 V
Brady Statistic AP VS Percent: 0 %
Brady Statistic AS VP Percent: 99.51 %
Brady Statistic AS VS Percent: 0.01 %
Brady Statistic RV Percent Paced: 99.99 %
Date Time Interrogation Session: 20181018162526
Implantable Lead Implant Date: 20081014
Implantable Lead Location: 753859
Implantable Lead Model: 5076
Lead Channel Impedance Value: 456 Ohm
Lead Channel Impedance Value: 513 Ohm
Lead Channel Pacing Threshold Amplitude: 0.5 V
Lead Channel Pacing Threshold Amplitude: 0.75 V
Lead Channel Setting Pacing Amplitude: 2 V
Lead Channel Setting Pacing Amplitude: 2.5 V
Lead Channel Setting Pacing Pulse Width: 0.4 ms
Lead Channel Setting Sensing Sensitivity: 5.6 mV
MDC IDC LEAD IMPLANT DT: 20081014
MDC IDC LEAD LOCATION: 753860
MDC IDC MSMT BATTERY REMAINING LONGEVITY: 101 mo
MDC IDC MSMT LEADCHNL RA IMPEDANCE VALUE: 437 Ohm
MDC IDC MSMT LEADCHNL RA PACING THRESHOLD PULSEWIDTH: 0.4 ms
MDC IDC MSMT LEADCHNL RA SENSING INTR AMPL: 1.125 mV
MDC IDC MSMT LEADCHNL RV IMPEDANCE VALUE: 608 Ohm
MDC IDC MSMT LEADCHNL RV PACING THRESHOLD PULSEWIDTH: 0.4 ms
MDC IDC PG IMPLANT DT: 20170911
MDC IDC STAT BRADY AP VP PERCENT: 0.49 %
MDC IDC STAT BRADY RA PERCENT PACED: 0.49 %

## 2017-07-17 NOTE — Progress Notes (Signed)
Patient Care Team: Nicoletta Dress, MD as PCP - General (Internal Medicine)   HPI  Amanda Bryant is a 76 y.o. female Seen in followup for syncope which turned out to be associated by intermittent complete heart block documented by loop recorder insertion 2008 and subsequently replaced by a dual-chamber pacemaker-Medtronic also in 2008. She's had no recurrent syncope.   .    Her pacemaker reached ERI and reverted to VVI 9/17 and she underwent generator replacement.   Echo 8/17 EF normal   The patient denies chest pain, shortness of breath, nocturnal dyspnea, orthopnea or peripheral edema.  There have been no palpitations, lightheadedness or syncope.     Past Medical History:  Diagnosis Date  . Complete heart block (HCC)    intermittent, documented by prev ILR  . HTN (hypertension)   . Pacemaker    Implanted 2008  . PVC's (premature ventricular contractions)   . S/P cardiac catheterization    2001- nonobstructive disease; nl lv function    Past Surgical History:  Procedure Laterality Date  . EP IMPLANTABLE DEVICE N/A 06/10/2016   Procedure: PPM Generator Changeout;  Surgeon: Will Meredith Leeds, MD;  Location: [facility];  Service: Cardiovascular;  Laterality: N/A;    Current Outpatient Prescriptions  Medication Sig Dispense Refill  . acetaminophen (TYLENOL) 500 MG tablet Take 500 mg by mouth every 6 (six) hours as needed for mild pain.    Marland Kitchen alendronate (FOSAMAX) 70 MG tablet Take 70 mg by mouth every 7 (seven) days. Take with a full glass of water on an empty stomach.    Marland Kitchen aspirin EC 81 MG tablet Take 81 mg by mouth daily as needed for mild pain.    . Calcium Carbonate (CALTRATE 600 PO) Take 1 tablet by mouth daily.     . carvedilol (COREG) 6.25 MG tablet Take 6.25 mg by mouth 2 (two) times daily.    . Cholecalciferol (VITAMIN D) 1000 UNITS capsule Take 1,000 Units by mouth daily.      . fish oil-omega-3 fatty acids 1000 MG capsule Take 2 g by mouth  daily.      Marland Kitchen losartan (COZAAR) 50 MG tablet Take 50 mg by mouth daily.    Marland Kitchen omeprazole (PRILOSEC) 20 MG capsule Take 20 mg by mouth daily.      Marland Kitchen venlafaxine (EFFEXOR-XR) 37.5 MG 24 hr capsule Take 37.5 mg by mouth daily.      Current Facility-Administered Medications  Medication Dose Route Frequency Provider Last Rate Last Dose  . triamcinolone acetonide (KENALOG-40) injection 20 mg  20 mg Other Once Landis Martins, DPM        Allergies  Allergen Reactions  . Codeine Nausea Only  . Prednisone Other (See Comments)    Reaction: Increased heart rate  . Ultram [Tramadol Hcl] Itching  . Tape     Pulls skin off     Review of Systems negative except from HPI and PMH  Physical Exam BP 136/68   Pulse 66   Ht 5' 5.5" (1.664 m)   Wt 162 lb 6.4 oz (73.7 kg)   SpO2 96%   BMI 26.61 kg/m  Well developed and well nourished in no acute distress HENT normal E scleral and icterus clear Neck Supple JVP flat; carotids brisk and full Clear to ausculation  Regular rate and rhythm,2/6 murmur Soft with active bowel sounds No clubbing cyanosis  Edema Alert and oriented, grossly normal motor and sensory function Skin Warm and  Dry  ECG demonstrates P. synchronous pacing at 66 21/19/49   Assessment and  Plan  Complete heart block  Stable   Pacemaker-Medtronic The patient's device was interrogated.  The information was reviewed. No changes were made in the programming.    Hypertension    Syncope    No interval syncope   blood pressure well controlled

## 2017-07-17 NOTE — Patient Instructions (Addendum)
Medication Instructions:  Your physician recommends that you continue on your current medications as directed. Please refer to the Current Medication list given to you today.  Labwork: None ordered  Testing/Procedures: None ordered  Follow-Up: Remote monitoring is used to monitor your Pacemaker from home. This monitoring reduces the number of office visits required to check your device to one time per year. It allows [place] to keep an eye on the functioning of your device to ensure it is working properly. You are scheduled for a device check from home on 10/16/2017. You may send your transmission at any time that day. If you have a wireless device, the transmission will be sent automatically. After your physician reviews your transmission, you will receive a postcard with your next transmission date.  Your physician wants you to follow-up in: 1 year with Dr. Caryl Comes.   You will receive a reminder letter in the mail two months in advance. If you don't receive a letter, please call our office to schedule the follow-up appointment.  --- If you need a refill on your cardiac medications before your next appointment, please call your pharmacy. ---  Thank you for choosing [facility]!!    [phone]) [phone]  Any Other Special Instructions Will Be Listed Below (If Applicable).

## 2017-07-18 ENCOUNTER — Encounter: Payer: PPO | Admitting: Internal Medicine

## 2017-07-29 DIAGNOSIS — Z6826 Body mass index (BMI) 26.0-26.9, adult: Secondary | ICD-10-CM | POA: Diagnosis not present

## 2017-07-29 DIAGNOSIS — S81802A Unspecified open wound, left lower leg, initial encounter: Secondary | ICD-10-CM | POA: Diagnosis not present

## 2017-07-30 DIAGNOSIS — R110 Nausea: Secondary | ICD-10-CM | POA: Diagnosis not present

## 2017-07-30 DIAGNOSIS — S81802S Unspecified open wound, left lower leg, sequela: Secondary | ICD-10-CM | POA: Diagnosis not present

## 2017-07-30 DIAGNOSIS — Z6825 Body mass index (BMI) 25.0-25.9, adult: Secondary | ICD-10-CM | POA: Diagnosis not present

## 2017-08-04 ENCOUNTER — Telehealth: Payer: Self-pay | Admitting: Internal Medicine

## 2017-08-04 NOTE — Telephone Encounter (Signed)
Amanda Bryant is calling because she states she felt her pacemaker and then she felt light headed and a funny feeling from her head to her toes . Please call

## 2017-08-04 NOTE — Telephone Encounter (Signed)
Ms. Gerstel is dealing with a leg infection and is on antibiotics. She experienced a reaction to sulfa and has since been switched to doxycycline. She says on Saturday she felt bad, dizzy and felt her pacemaker "kick in." She is pacer dependent- I'm not sure what she might be feeling.  Helped her send a transmission while on the phone.  Normal device function. I let her know that she should keep track of her BP when she is symptomatic and let her PCP know. She verbalizes understanding.

## 2017-08-08 DIAGNOSIS — Z981 Arthrodesis status: Secondary | ICD-10-CM | POA: Diagnosis not present

## 2017-08-15 DIAGNOSIS — Z23 Encounter for immunization: Secondary | ICD-10-CM | POA: Diagnosis not present

## 2017-08-16 DIAGNOSIS — R05 Cough: Secondary | ICD-10-CM | POA: Diagnosis not present

## 2017-08-19 DIAGNOSIS — J208 Acute bronchitis due to other specified organisms: Secondary | ICD-10-CM | POA: Diagnosis not present

## 2017-08-19 DIAGNOSIS — Z6825 Body mass index (BMI) 25.0-25.9, adult: Secondary | ICD-10-CM | POA: Diagnosis not present

## 2017-10-02 DIAGNOSIS — F418 Other specified anxiety disorders: Secondary | ICD-10-CM | POA: Diagnosis not present

## 2017-10-02 DIAGNOSIS — E559 Vitamin D deficiency, unspecified: Secondary | ICD-10-CM | POA: Diagnosis not present

## 2017-10-02 DIAGNOSIS — I10 Essential (primary) hypertension: Secondary | ICD-10-CM | POA: Diagnosis not present

## 2017-10-02 DIAGNOSIS — G2581 Restless legs syndrome: Secondary | ICD-10-CM | POA: Diagnosis not present

## 2017-10-02 DIAGNOSIS — Z139 Encounter for screening, unspecified: Secondary | ICD-10-CM | POA: Diagnosis not present

## 2017-10-02 DIAGNOSIS — Z6825 Body mass index (BMI) 25.0-25.9, adult: Secondary | ICD-10-CM | POA: Diagnosis not present

## 2017-10-02 DIAGNOSIS — R7301 Impaired fasting glucose: Secondary | ICD-10-CM | POA: Diagnosis not present

## 2017-10-02 DIAGNOSIS — M810 Age-related osteoporosis without current pathological fracture: Secondary | ICD-10-CM | POA: Diagnosis not present

## 2017-10-02 DIAGNOSIS — J019 Acute sinusitis, unspecified: Secondary | ICD-10-CM | POA: Diagnosis not present

## 2017-10-02 DIAGNOSIS — R600 Localized edema: Secondary | ICD-10-CM | POA: Diagnosis not present

## 2017-10-02 DIAGNOSIS — E785 Hyperlipidemia, unspecified: Secondary | ICD-10-CM | POA: Diagnosis not present

## 2017-10-02 DIAGNOSIS — K219 Gastro-esophageal reflux disease without esophagitis: Secondary | ICD-10-CM | POA: Diagnosis not present

## 2017-10-13 DIAGNOSIS — B373 Candidiasis of vulva and vagina: Secondary | ICD-10-CM | POA: Diagnosis not present

## 2017-10-13 DIAGNOSIS — J019 Acute sinusitis, unspecified: Secondary | ICD-10-CM | POA: Diagnosis not present

## 2017-10-16 ENCOUNTER — Telehealth: Payer: Self-pay | Admitting: Cardiology

## 2017-10-16 ENCOUNTER — Ambulatory Visit (INDEPENDENT_AMBULATORY_CARE_PROVIDER_SITE_OTHER): Payer: PPO | Admitting: *Deleted

## 2017-10-16 DIAGNOSIS — I442 Atrioventricular block, complete: Secondary | ICD-10-CM | POA: Diagnosis not present

## 2017-10-16 NOTE — Telephone Encounter (Signed)
LMOVM reminding pt to send remote transmission.   

## 2017-10-17 ENCOUNTER — Encounter: Payer: Self-pay | Admitting: Cardiology

## 2017-10-17 NOTE — Progress Notes (Signed)
Remote pacemaker transmission.   

## 2017-10-28 LAB — CUP PACEART REMOTE DEVICE CHECK
Battery Voltage: 3.02 V
Brady Statistic AP VP Percent: 0.22 %
Brady Statistic AP VS Percent: 0 %
Brady Statistic AS VP Percent: 99.77 %
Brady Statistic RA Percent Paced: 0.22 %
Brady Statistic RV Percent Paced: 99.99 %
Date Time Interrogation Session: 20190117215152
Implantable Lead Implant Date: 20081014
Implantable Lead Location: 753860
Implantable Lead Model: 5076
Implantable Lead Model: 5076
Lead Channel Impedance Value: 608 Ohm
Lead Channel Pacing Threshold Pulse Width: 0.4 ms
Lead Channel Sensing Intrinsic Amplitude: 11 mV
Lead Channel Sensing Intrinsic Amplitude: 11 mV
Lead Channel Setting Pacing Amplitude: 2.5 V
Lead Channel Setting Pacing Pulse Width: 0.4 ms
Lead Channel Setting Sensing Sensitivity: 5.6 mV
MDC IDC LEAD IMPLANT DT: 20081014
MDC IDC LEAD LOCATION: 753859
MDC IDC MSMT BATTERY REMAINING LONGEVITY: 100 mo
MDC IDC MSMT LEADCHNL RA IMPEDANCE VALUE: 437 Ohm
MDC IDC MSMT LEADCHNL RA IMPEDANCE VALUE: 513 Ohm
MDC IDC MSMT LEADCHNL RA PACING THRESHOLD AMPLITUDE: 0.5 V
MDC IDC MSMT LEADCHNL RA SENSING INTR AMPL: 1.5 mV
MDC IDC MSMT LEADCHNL RA SENSING INTR AMPL: 1.5 mV
MDC IDC MSMT LEADCHNL RV IMPEDANCE VALUE: 475 Ohm
MDC IDC MSMT LEADCHNL RV PACING THRESHOLD AMPLITUDE: 0.75 V
MDC IDC MSMT LEADCHNL RV PACING THRESHOLD PULSEWIDTH: 0.4 ms
MDC IDC PG IMPLANT DT: 20170911
MDC IDC SET LEADCHNL RA PACING AMPLITUDE: 2 V
MDC IDC STAT BRADY AS VS PERCENT: 0 %

## 2018-01-08 DIAGNOSIS — Z1331 Encounter for screening for depression: Secondary | ICD-10-CM | POA: Diagnosis not present

## 2018-01-08 DIAGNOSIS — E785 Hyperlipidemia, unspecified: Secondary | ICD-10-CM | POA: Diagnosis not present

## 2018-01-08 DIAGNOSIS — K219 Gastro-esophageal reflux disease without esophagitis: Secondary | ICD-10-CM | POA: Diagnosis not present

## 2018-01-08 DIAGNOSIS — E559 Vitamin D deficiency, unspecified: Secondary | ICD-10-CM | POA: Diagnosis not present

## 2018-01-08 DIAGNOSIS — Z1231 Encounter for screening mammogram for malignant neoplasm of breast: Secondary | ICD-10-CM | POA: Diagnosis not present

## 2018-01-08 DIAGNOSIS — G2581 Restless legs syndrome: Secondary | ICD-10-CM | POA: Diagnosis not present

## 2018-01-08 DIAGNOSIS — F418 Other specified anxiety disorders: Secondary | ICD-10-CM | POA: Diagnosis not present

## 2018-01-08 DIAGNOSIS — R7301 Impaired fasting glucose: Secondary | ICD-10-CM | POA: Diagnosis not present

## 2018-01-08 DIAGNOSIS — I10 Essential (primary) hypertension: Secondary | ICD-10-CM | POA: Diagnosis not present

## 2018-01-08 DIAGNOSIS — M810 Age-related osteoporosis without current pathological fracture: Secondary | ICD-10-CM | POA: Diagnosis not present

## 2018-01-08 DIAGNOSIS — R600 Localized edema: Secondary | ICD-10-CM | POA: Diagnosis not present

## 2018-01-12 DIAGNOSIS — L570 Actinic keratosis: Secondary | ICD-10-CM | POA: Diagnosis not present

## 2018-01-12 DIAGNOSIS — B351 Tinea unguium: Secondary | ICD-10-CM | POA: Diagnosis not present

## 2018-01-12 DIAGNOSIS — L820 Inflamed seborrheic keratosis: Secondary | ICD-10-CM | POA: Diagnosis not present

## 2018-01-15 ENCOUNTER — Telehealth: Payer: Self-pay | Admitting: Cardiology

## 2018-01-15 ENCOUNTER — Ambulatory Visit (INDEPENDENT_AMBULATORY_CARE_PROVIDER_SITE_OTHER): Payer: PPO | Admitting: *Deleted

## 2018-01-15 DIAGNOSIS — I442 Atrioventricular block, complete: Secondary | ICD-10-CM

## 2018-01-15 NOTE — Telephone Encounter (Signed)
LMOVM reminding pt to send remote transmission.   

## 2018-01-16 ENCOUNTER — Encounter: Payer: Self-pay | Admitting: Cardiology

## 2018-01-16 NOTE — Progress Notes (Signed)
Remote pacemaker transmission.   

## 2018-01-21 LAB — CUP PACEART REMOTE DEVICE CHECK
Brady Statistic AP VP Percent: 0.1 %
Brady Statistic AS VP Percent: 99.9 %
Brady Statistic RA Percent Paced: 0.1 %
Date Time Interrogation Session: 20190418220212
Implantable Lead Location: 753859
Implantable Lead Model: 5076
Implantable Pulse Generator Implant Date: 20170911
Lead Channel Impedance Value: 437 Ohm
Lead Channel Impedance Value: 475 Ohm
Lead Channel Impedance Value: 513 Ohm
Lead Channel Pacing Threshold Amplitude: 0.75 V
Lead Channel Pacing Threshold Pulse Width: 0.4 ms
Lead Channel Pacing Threshold Pulse Width: 0.4 ms
Lead Channel Sensing Intrinsic Amplitude: 9.375 mV
Lead Channel Setting Pacing Amplitude: 2 V
Lead Channel Setting Pacing Amplitude: 2.5 V
Lead Channel Setting Pacing Pulse Width: 0.4 ms
MDC IDC LEAD IMPLANT DT: 20081014
MDC IDC LEAD IMPLANT DT: 20081014
MDC IDC LEAD LOCATION: 753860
MDC IDC MSMT BATTERY REMAINING LONGEVITY: 99 mo
MDC IDC MSMT BATTERY VOLTAGE: 3.02 V
MDC IDC MSMT LEADCHNL RA PACING THRESHOLD AMPLITUDE: 0.5 V
MDC IDC MSMT LEADCHNL RA SENSING INTR AMPL: 1.125 mV
MDC IDC MSMT LEADCHNL RA SENSING INTR AMPL: 1.125 mV
MDC IDC MSMT LEADCHNL RV IMPEDANCE VALUE: 608 Ohm
MDC IDC MSMT LEADCHNL RV SENSING INTR AMPL: 9.375 mV
MDC IDC SET LEADCHNL RV SENSING SENSITIVITY: 5.6 mV
MDC IDC STAT BRADY AP VS PERCENT: 0 %
MDC IDC STAT BRADY AS VS PERCENT: 0.01 %
MDC IDC STAT BRADY RV PERCENT PACED: 99.99 %

## 2018-02-02 ENCOUNTER — Encounter: Payer: Self-pay | Admitting: Gastroenterology

## 2018-02-02 DIAGNOSIS — Z1231 Encounter for screening mammogram for malignant neoplasm of breast: Secondary | ICD-10-CM | POA: Diagnosis not present

## 2018-02-11 DIAGNOSIS — H35411 Lattice degeneration of retina, right eye: Secondary | ICD-10-CM | POA: Diagnosis not present

## 2018-02-11 DIAGNOSIS — H01024 Squamous blepharitis left upper eyelid: Secondary | ICD-10-CM | POA: Diagnosis not present

## 2018-02-11 DIAGNOSIS — H01021 Squamous blepharitis right upper eyelid: Secondary | ICD-10-CM | POA: Diagnosis not present

## 2018-02-11 DIAGNOSIS — H04123 Dry eye syndrome of bilateral lacrimal glands: Secondary | ICD-10-CM | POA: Diagnosis not present

## 2018-02-11 DIAGNOSIS — H43811 Vitreous degeneration, right eye: Secondary | ICD-10-CM | POA: Diagnosis not present

## 2018-02-11 DIAGNOSIS — H01022 Squamous blepharitis right lower eyelid: Secondary | ICD-10-CM | POA: Diagnosis not present

## 2018-02-11 DIAGNOSIS — H35363 Drusen (degenerative) of macula, bilateral: Secondary | ICD-10-CM | POA: Diagnosis not present

## 2018-02-11 DIAGNOSIS — H02831 Dermatochalasis of right upper eyelid: Secondary | ICD-10-CM | POA: Diagnosis not present

## 2018-02-11 DIAGNOSIS — H2513 Age-related nuclear cataract, bilateral: Secondary | ICD-10-CM | POA: Diagnosis not present

## 2018-02-11 DIAGNOSIS — H1851 Endothelial corneal dystrophy: Secondary | ICD-10-CM | POA: Diagnosis not present

## 2018-02-11 DIAGNOSIS — H02834 Dermatochalasis of left upper eyelid: Secondary | ICD-10-CM | POA: Diagnosis not present

## 2018-02-11 DIAGNOSIS — H01025 Squamous blepharitis left lower eyelid: Secondary | ICD-10-CM | POA: Diagnosis not present

## 2018-03-03 ENCOUNTER — Ambulatory Visit: Payer: PPO | Admitting: Gastroenterology

## 2018-03-04 ENCOUNTER — Encounter: Payer: Self-pay | Admitting: Gastroenterology

## 2018-03-04 DIAGNOSIS — R928 Other abnormal and inconclusive findings on diagnostic imaging of breast: Secondary | ICD-10-CM | POA: Diagnosis not present

## 2018-03-05 ENCOUNTER — Other Ambulatory Visit (INDEPENDENT_AMBULATORY_CARE_PROVIDER_SITE_OTHER): Payer: PPO

## 2018-03-05 ENCOUNTER — Encounter: Payer: Self-pay | Admitting: Gastroenterology

## 2018-03-05 ENCOUNTER — Ambulatory Visit (INDEPENDENT_AMBULATORY_CARE_PROVIDER_SITE_OTHER): Payer: PPO | Admitting: Gastroenterology

## 2018-03-05 VITALS — BP 122/68 | HR 60 | Ht 65.5 in | Wt 160.0 lb

## 2018-03-05 DIAGNOSIS — R110 Nausea: Secondary | ICD-10-CM | POA: Diagnosis not present

## 2018-03-05 DIAGNOSIS — R1011 Right upper quadrant pain: Secondary | ICD-10-CM | POA: Diagnosis not present

## 2018-03-05 LAB — COMPREHENSIVE METABOLIC PANEL
ALK PHOS: 75 U/L (ref 39–117)
ALT: 30 U/L (ref 0–35)
AST: 35 U/L (ref 0–37)
Albumin: 4 g/dL (ref 3.5–5.2)
BILIRUBIN TOTAL: 0.4 mg/dL (ref 0.2–1.2)
BUN: 14 mg/dL (ref 6–23)
CO2: 31 meq/L (ref 19–32)
Calcium: 9.6 mg/dL (ref 8.4–10.5)
Chloride: 105 mEq/L (ref 96–112)
Creatinine, Ser: 0.58 mg/dL (ref 0.40–1.20)
GFR: 107.12 mL/min (ref >60.00)
GLUCOSE: 104 mg/dL — AB (ref 70–99)
Potassium: 5.3 mEq/L — ABNORMAL HIGH (ref 3.5–5.1)
SODIUM: 140 meq/L (ref 135–145)
TOTAL PROTEIN: 6.4 g/dL (ref 6.0–8.3)

## 2018-03-05 LAB — CBC WITH DIFFERENTIAL/PLATELET
Basophils Absolute: 0.1 10*3/uL (ref 0.0–0.1)
Basophils Relative: 0.8 % (ref 0.0–3.0)
EOS PCT: 2.2 % (ref 0.0–5.0)
Eosinophils Absolute: 0.2 10*3/uL (ref 0.0–0.7)
HCT: 39.7 % (ref 36.0–46.0)
Hemoglobin: 13.1 g/dL (ref 12.0–15.0)
LYMPHS ABS: 2.5 10*3/uL (ref 0.7–4.0)
Lymphocytes Relative: 35.6 % (ref 12.0–46.0)
MCHC: 33.1 g/dL (ref 30.0–36.0)
MCV: 90.9 fl (ref 78.0–100.0)
MONOS PCT: 10.8 % (ref 3.0–12.0)
Monocytes Absolute: 0.8 10*3/uL (ref 0.1–1.0)
NEUTROS ABS: 3.6 10*3/uL (ref 1.4–7.7)
NEUTROS PCT: 50.6 % (ref 43.0–77.0)
PLATELETS: 184 10*3/uL (ref 150.0–400.0)
RBC: 4.37 Mil/uL (ref 3.87–5.11)
RDW: 13.1 % (ref 11.5–15.5)
WBC: 7.1 10*3/uL (ref 4.0–10.5)

## 2018-03-05 LAB — LIPASE: LIPASE: 52 U/L (ref 11.0–59.0)

## 2018-03-05 MED ORDER — PROMETHAZINE HCL 25 MG PO TABS: 25.0000 mg | ORAL_TABLET | Freq: Three times a day (TID) | ORAL | 2 refills | Status: DC | PRN

## 2018-03-05 NOTE — Patient Instructions (Addendum)
If you are age 77 or older, your body mass index should be between 23-30. Your Body mass index is 26.22 kg/m. If this is out of the aforementioned range listed, please consider follow up with your Primary Care Provider.  If you are age 77 or younger, your body mass index should be between 19-25. Your Body mass index is 26.22 kg/m. If this is out of the aformentioned range listed, please consider follow up with your Primary Care Provider.   You have been scheduled for an abdominal ultrasound at [facility] (1st floor [address] ) on 03/12/18 at 9:00am. Please arrive 15 minutes prior to your appointment for registration. Make certain not to have anything to eat or drink 6 hours prior to your appointment. Should you need to reschedule your appointment, please contact radiology at [phone]. This test typically takes about 30 minutes to perform.  You have been scheduled for an endoscopy. Please follow written instructions given to you at your visit today. If you use inhalers (even only as needed), please bring them with you on the day of your procedure. Your physician has requested that you go to www.startemmi.com and enter the access code given to you at your visit today. This web site gives a general overview about your procedure. However, you should still follow specific instructions given to you by our office regarding your preparation for the procedure.  Please stop at the lab before you leave the office today.   We have sent the following medications to your pharmacy for you to pick up at your convenience: Phenergan 25mg  take one tablet by mouth every 8 hours as needed.  Thank you,  Dr. Jackquline Denmark

## 2018-03-05 NOTE — Progress Notes (Signed)
Chief Complaint: Abdominal pain  Referring Provider:  Nicoletta Dress, MD      ASSESSMENT AND PLAN;   #1. RUQ abdominal pain with episodic nausea. Pt is s/p cholecystectomy in the past and negative ERCP as below.  Has history of GERD on chronic omeprazole.  -Check CBC, CMP and lipase today. -Please obtain previous records (last colonoscopy report and labs). -[place], if positive, proceed with CT (Cannot do MRCP due to pacemaker) -Proceed with EGD. -Continue Phenergan 25 mg p.o. every 6-8 hours as needed. -Continue omeprazole 20 mg p.o. once a day -If still with problems and the above work-up is negative, will give her a trial of Bentyl. -Discussed extensively with the patient and patient's husband.   #2. H/O Elevated alkaline phosphatase with imaging study showing dilated right intrahepatic biliary ducts.  Status post ERCP 04/26/2015-normal cholangiogram, sphincterotomy at [facility].   HPI:    Amanda Bryant is a 77 y.o. female  RUQ pain with nausea over the last 3 to 4 months Attacks every 2 weeks, lasting for about 2 to 3 hours and then spontaneously getting better.  No radiation of the pain.  No vomiting. Had nocturnal symptoms as well. Could not identify any exacerbating factors or any fatty food intolerance. Had fever few times with questionable chills. Denies having any jaundice dark urine or pale stools. Has been having regular bowel movements ever since she has started magnesium supplements. No weight loss or loss of appetite.   Past Medical History:  Diagnosis Date  . Complete heart block (HCC)    intermittent, documented by prev ILR  . HTN (hypertension)   . Pacemaker    Implanted 2008  . PVC's (premature ventricular contractions)   . S/P cardiac catheterization    2001- nonobstructive disease; nl lv function    Past Surgical History:  Procedure Laterality Date  . COLONOSCOPY  07/22/2011   Colonic Polyp, mild sigmoid diverticulosis, small internal  hemorrhoids. Bx: hyperplastic polyps.  . EP IMPLANTABLE DEVICE N/A 06/10/2016   Procedure: PPM Generator Changeout;  Surgeon: Will Meredith Leeds, MD;  Location: [facility];  Service: Cardiovascular;  Laterality: N/A;  . ERCP     Normal cholangiogram- no hilar/righthepatic duct stricture visualized.    Family History  Problem Relation Age of Onset  . Heart disease Mother   . Diabetes Mother   . Diabetes Father   . Heart disease Father   . Colon cancer Neg Hx     Social History   Tobacco Use  . Smoking status: Never Smoker  . Smokeless tobacco: Never Used  Substance Use Topics  . Alcohol use: No    Alcohol/week: 0.0 oz  . Drug use: No    Current Outpatient Medications  Medication Sig Dispense Refill  . acetaminophen (TYLENOL) 500 MG tablet Take 500 mg by mouth every 6 (six) hours as needed for mild pain.    Marland Kitchen alendronate (FOSAMAX) 70 MG tablet Take 70 mg by mouth every 7 (seven) days. Take with a full glass of water on an empty stomach.    Marland Kitchen aspirin EC 81 MG tablet Take 81 mg by mouth daily as needed for mild pain.    . Calcium Carbonate (CALTRATE 600 PO) Take 1 tablet by mouth daily.     . carvedilol (COREG) 6.25 MG tablet Take 6.25 mg by mouth 2 (two) times daily.    . Cholecalciferol (VITAMIN D) 1000 UNITS capsule Take 1,000 Units by mouth daily.      Marland Kitchen  fish oil-omega-3 fatty acids 1000 MG capsule Take 2 g by mouth daily.      Marland Kitchen losartan (COZAAR) 50 MG tablet Take 50 mg by mouth daily.    Marland Kitchen omeprazole (PRILOSEC) 20 MG capsule Take 20 mg by mouth daily.      Marland Kitchen venlafaxine (EFFEXOR-XR) 37.5 MG 24 hr capsule Take 37.5 mg by mouth daily.      Current Facility-Administered Medications  Medication Dose Route Frequency Provider Last Rate Last Dose  . triamcinolone acetonide (KENALOG-40) injection 20 mg  20 mg Other Once Landis Martins, DPM        Allergies  Allergen Reactions  . Codeine Nausea Only  . Prednisone Other (See Comments)    Reaction: Increased heart  rate  . Ultram [Tramadol Hcl] Itching  . Tape     Pulls skin off     Review of Systems:  Constitutional: Denies diaphoresis, appetite change and fatigue.  HEENT: Denies photophobia, eye pain, redness, hearing loss, ear pain, congestion, sore throat, rhinorrhea, sneezing, mouth sores, neck pain, neck stiffness and tinnitus.   Respiratory: Denies SOB, DOE, cough, chest tightness,  and wheezing.   Cardiovascular: Denies chest pain, palpitations and leg swelling.  Genitourinary: Denies dysuria, urgency, frequency, hematuria, flank pain and difficulty urinating.  Musculoskeletal: Denies myalgias, back pain, joint swelling, Has arthralgias and arthritis.  Skin: No rash.  Neurological: Denies dizziness, seizures, syncope, weakness, light-headedness, numbness and headaches.  Hematological: Denies adenopathy. Easy bruising, personal or family bleeding history  Psychiatric/Behavioral: Has anxiety or depression     Physical Exam:    BP 122/68   Pulse 60   Ht 5' 5.5" (1.664 m)   Wt 160 lb (72.6 kg)   BMI 26.22 kg/m  Filed Weights   03/05/18 1104  Weight: 160 lb (72.6 kg)   Constitutional:  Well-developed, in no acute distress. Psychiatric: Normal mood and affect. Behavior is normal. HEENT: Pupils normal.  Conjunctivae are normal. No scleral icterus. Neck supple.  Cardiovascular: Normal rate, regular rhythm. No edema Pulmonary/chest: Effort normal and breath sounds normal. No wheezing, rales or rhonchi. Abdominal: Soft, nondistended. Nontender. Bowel sounds active throughout. There are no masses palpable. No hepatomegaly. Rectal:  defered Neurological: Alert and oriented to person place and time. Skin: Skin is warm and dry. No rashes noted.  Data Reviewed: I have personally reviewed following labs and imaging studies  CBC: CBC Latest Ref Rng & Units 06/04/2016 01/14/2009 01/13/2009  WBC 3.8 - 10.8 K/uL 7.4 - -  Hemoglobin 11.7 - 15.5 g/dL 13.2 11.9(L) 10.9(L)  Hematocrit 35.0 - 45.0 %  40.6 34.7(L) 31.0(L)  Platelets 140 - 400 K/uL 188 - -    CMP: CMP Latest Ref Rng & Units 06/10/2016 06/04/2016 05/13/2016  Glucose 65 - 99 mg/dL 103(H) 86 119(H)  BUN 6 - 20 mg/dL '20 18 18  ' Creatinine 0.44 - 1.00 mg/dL 0.81 0.80 0.90  Sodium 135 - 145 mmol/L 141 141 142  Potassium 3.5 - 5.1 mmol/L 4.2 4.5 4.5  Chloride 101 - 111 mmol/L 110 104 104  CO2 22 - 32 mmol/L '24 22 29  ' Calcium 8.9 - 10.3 mg/dL 9.2 9.3 9.1  Total Protein - - - -  Total Bilirubin - - - -  Alkaline Phos - - - -  AST - - - -  ALT - - - -   Hepatic Function 12/01/2007  Total Protein 6.5  Albumin 3.6  AST 30  ALT 33  Alk Phosphatase 59  Total Bilirubin 0.6  Carmell Austria, MD 03/05/2018, 11:18 AM  Cc: Nicoletta Dress, MD

## 2018-03-06 ENCOUNTER — Encounter: Payer: Self-pay | Admitting: Gastroenterology

## 2018-03-12 ENCOUNTER — Encounter (HOSPITAL_BASED_OUTPATIENT_CLINIC_OR_DEPARTMENT_OTHER): Payer: Self-pay

## 2018-03-12 ENCOUNTER — Ambulatory Visit (HOSPITAL_BASED_OUTPATIENT_CLINIC_OR_DEPARTMENT_OTHER)
Admission: RE | Admit: 2018-03-12 | Discharge: 2018-03-12 | Disposition: A | Discharge status: Home / Self Care | Payer: PPO | Source: Ambulatory Visit | Attending: Gastroenterology | Admitting: Gastroenterology

## 2018-03-12 DIAGNOSIS — K760 Fatty (change of) liver, not elsewhere classified: Secondary | ICD-10-CM | POA: Diagnosis not present

## 2018-03-12 DIAGNOSIS — N281 Cyst of kidney, acquired: Secondary | ICD-10-CM | POA: Diagnosis not present

## 2018-03-12 DIAGNOSIS — R110 Nausea: Secondary | ICD-10-CM | POA: Diagnosis not present

## 2018-03-12 DIAGNOSIS — Z9049 Acquired absence of other specified parts of digestive tract: Secondary | ICD-10-CM | POA: Diagnosis not present

## 2018-03-12 DIAGNOSIS — R1011 Right upper quadrant pain: Secondary | ICD-10-CM | POA: Diagnosis not present

## 2018-03-16 ENCOUNTER — Ambulatory Visit (AMBULATORY_SURGERY_CENTER): Payer: PPO | Admitting: Gastroenterology

## 2018-03-16 ENCOUNTER — Other Ambulatory Visit: Payer: Self-pay

## 2018-03-16 ENCOUNTER — Encounter: Payer: Self-pay | Admitting: Gastroenterology

## 2018-03-16 VITALS — BP 113/65 | HR 75 | Temp 97.1°F | Resp 17 | Ht 65.5 in | Wt 160.0 lb

## 2018-03-16 DIAGNOSIS — K2970 Gastritis, unspecified, without bleeding: Secondary | ICD-10-CM | POA: Diagnosis not present

## 2018-03-16 DIAGNOSIS — R1011 Right upper quadrant pain: Secondary | ICD-10-CM

## 2018-03-16 DIAGNOSIS — Z6825 Body mass index (BMI) 25.0-25.9, adult: Secondary | ICD-10-CM | POA: Diagnosis not present

## 2018-03-16 DIAGNOSIS — N2889 Other specified disorders of kidney and ureter: Secondary | ICD-10-CM | POA: Diagnosis not present

## 2018-03-16 DIAGNOSIS — K2950 Unspecified chronic gastritis without bleeding: Secondary | ICD-10-CM | POA: Diagnosis not present

## 2018-03-16 DIAGNOSIS — I10 Essential (primary) hypertension: Secondary | ICD-10-CM | POA: Diagnosis not present

## 2018-03-16 DIAGNOSIS — R110 Nausea: Secondary | ICD-10-CM | POA: Diagnosis not present

## 2018-03-16 MED ORDER — SODIUM CHLORIDE 0.9 % IV SOLN: 500.0000 mL | Freq: Once | INTRAVENOUS | Status: DC

## 2018-03-16 NOTE — Op Note (Signed)
[facility] Patient Name: Taleah Bellantoni Procedure Date: 03/16/2018 7:59 AM MRN: [id number] Endoscopist: Jackquline Denmark , MD Age: 77 Referring MD:  Date of Birth: 12-04-1940 Gender: Female Account #: [id number] Procedure:                Upper GI endoscopy Indications:              Abdominal pain in the right upper quadrant Medicines:                Monitored Anesthesia Care Procedure:                Pre-Anesthesia Assessment:                           - Prior to the procedure, a History and Physical                            was performed, and patient medications and                            allergies were reviewed. The patient is competent.                            The risks and benefits of the procedure and the                            sedation options and risks were discussed with the                            patient. All questions were answered and informed                            consent was obtained. Patient identification and                            proposed procedure were verified by the physician                            in the procedure room. Mental Status Examination:                            alert and oriented. Prophylactic Antibiotics: The                            patient does not require prophylactic antibiotics.                            Prior Anticoagulants: The patient has taken no                            previous anticoagulant or antiplatelet agents. ASA                            Grade Assessment: II - A patient with mild systemic  disease. After reviewing the risks and benefits,                            the patient was deemed in satisfactory condition to                            undergo the procedure. The anesthesia plan was to                            use monitored anesthesia care (MAC). Immediately                            prior to administration of medications, the patient   was re-assessed for adequacy to receive sedatives.                            The heart rate, respiratory rate, oxygen                            saturations, blood pressure, adequacy of pulmonary                            ventilation, and response to care were monitored                            throughout the procedure. The physical status of                            the patient was re-assessed after the procedure.                           After obtaining informed consent, the endoscope was                            passed under direct vision. Throughout the                            procedure, the patient's blood pressure, pulse, and                            oxygen saturations were monitored continuously. The                            Endoscope was introduced through the mouth, and                            advanced to the second part of duodenum. The upper                            GI endoscopy was accomplished without difficulty.                            The patient tolerated the procedure well. Scope In: Scope Out: Findings:  A small hiatal hernia was present.                           Localized mild inflammation characterized by                            erythema was found in the gastric antrum. Biopsies                            were taken with a cold forceps for Helicobacter                            pylori testing. Significant retained food in the                            stomach limiting the examination. No gastric outlet                            obstruction.                           A small non-bleeding diverticulum was found in the                            second portion of the duodenum. Complications:            No immediate complications. Estimated Blood Loss:     Estimated blood loss: none. Impression:               - Small hiatal hernia.                           - Gastritis. Biopsied.                           - Non-bleeding  duodenal diverticulum.                           - Significant food in the stomach limiting the                            examination. No gastric outlet obstruction. Recommendation:           - Patient has a contact number available for                            emergencies. The signs and symptoms of potential                            delayed complications were discussed with the                            patient. Return to normal activities tomorrow.                            Written discharge instructions were provided to the  patient.                           - Resume previous diet.                           - Continue present medications.                           - Await pathology results.                           - Return to GI clinic in 8 weeks. If still with                            problems, would proceed with solid-phase gastric                            emptying scan to rule out gastroparesis disease. Jackquline Denmark, MD 03/16/2018 8:19:32 AM This report has been signed electronically.

## 2018-03-16 NOTE — Progress Notes (Signed)
Called to room to assist during endoscopic procedure.  Patient ID and intended procedure confirmed with present staff. Received instructions for my participation in the procedure from the performing physician.  

## 2018-03-16 NOTE — Progress Notes (Signed)
Report given to PACU, vss 

## 2018-03-16 NOTE — Patient Instructions (Signed)
*  handout given on GERD, hiatal hernia   YOU HAD AN ENDOSCOPIC PROCEDURE TODAY AT [facility]:   Refer to the procedure report that was given to you for any specific questions about what was found during the examination.  If the procedure report does not answer your questions, please call your gastroenterologist to clarify.  If you requested that your care partner not be given the details of your procedure findings, then the procedure report has been included in a sealed envelope for you to review at your convenience later.  YOU SHOULD EXPECT: Some feelings of bloating in the abdomen. Passage of more gas than usual.  Walking can help get rid of the air that was put into your GI tract during the procedure and reduce the bloating. If you had a lower endoscopy (such as a colonoscopy or flexible sigmoidoscopy) you may notice spotting of blood in your stool or on the toilet paper. If you underwent a bowel prep for your procedure, you may not have a normal bowel movement for a few days.  Please Note:  You might notice some irritation and congestion in your nose or some drainage.  This is from the oxygen used during your procedure.  There is no need for concern and it should clear up in a day or so.  SYMPTOMS TO REPORT IMMEDIATELY:   Following upper endoscopy (EGD)  Vomiting of blood or coffee ground material  New chest pain or pain under the shoulder blades  Painful or persistently difficult swallowing  New shortness of breath  Fever of 100F or higher  Black, tarry-looking stools  For urgent or emergent issues, a gastroenterologist can be reached at any hour by calling [phone].   DIET:  We do recommend a small meal at first, but then you may proceed to your regular diet.  Drink plenty of fluids but you should avoid alcoholic beverages for 24 hours.  ACTIVITY:  You should plan to take it easy for the rest of today and you should NOT DRIVE or use heavy machinery until  tomorrow (because of the sedation medicines used during the test).    FOLLOW UP: Our staff will call the number listed on your records the next business day following your procedure to check on you and address any questions or concerns that you may have regarding the information given to you following your procedure. If we do not reach you, we will leave a message.  However, if you are feeling well and you are not experiencing any problems, there is no need to return our call.  We will assume that you have returned to your regular daily activities without incident.  If any biopsies were taken you will be contacted by phone or by letter within the next 1-3 weeks.  Please call us at [phone] if you have not heard about the biopsies in 3 weeks.    SIGNATURES/CONFIDENTIALITY: You and/or your care partner have signed paperwork which will be entered into your electronic medical record.  These signatures attest to the fact that that the information above on your After Visit Summary has been reviewed and is understood.  Full responsibility of the confidentiality of this discharge information lies with you and/or your care-partner.

## 2018-03-17 ENCOUNTER — Telehealth: Payer: Self-pay

## 2018-03-17 NOTE — Telephone Encounter (Signed)
  Follow up Call-  Call back number 03/16/2018  Post procedure Call Back phone  # [phone]  Permission to leave phone message Yes  Some recent data might be hidden     Left message

## 2018-03-17 NOTE — Telephone Encounter (Signed)
NO ANSWER, MESSAGE LEFT FOR PATIENT. 

## 2018-03-19 ENCOUNTER — Encounter: Payer: Self-pay | Admitting: Gastroenterology

## 2018-03-19 DIAGNOSIS — I700 Atherosclerosis of aorta: Secondary | ICD-10-CM | POA: Diagnosis not present

## 2018-03-19 DIAGNOSIS — N2889 Other specified disorders of kidney and ureter: Secondary | ICD-10-CM | POA: Diagnosis not present

## 2018-03-19 DIAGNOSIS — N200 Calculus of kidney: Secondary | ICD-10-CM | POA: Diagnosis not present

## 2018-03-19 DIAGNOSIS — I2510 Atherosclerotic heart disease of native coronary artery without angina pectoris: Secondary | ICD-10-CM | POA: Diagnosis not present

## 2018-04-14 DIAGNOSIS — N281 Cyst of kidney, acquired: Secondary | ICD-10-CM | POA: Diagnosis not present

## 2018-04-16 ENCOUNTER — Ambulatory Visit (INDEPENDENT_AMBULATORY_CARE_PROVIDER_SITE_OTHER): Payer: PPO | Admitting: *Deleted

## 2018-04-16 DIAGNOSIS — I442 Atrioventricular block, complete: Secondary | ICD-10-CM | POA: Diagnosis not present

## 2018-04-16 DIAGNOSIS — F418 Other specified anxiety disorders: Secondary | ICD-10-CM | POA: Diagnosis not present

## 2018-04-16 DIAGNOSIS — M810 Age-related osteoporosis without current pathological fracture: Secondary | ICD-10-CM | POA: Diagnosis not present

## 2018-04-16 DIAGNOSIS — I10 Essential (primary) hypertension: Secondary | ICD-10-CM | POA: Diagnosis not present

## 2018-04-16 DIAGNOSIS — G2581 Restless legs syndrome: Secondary | ICD-10-CM | POA: Diagnosis not present

## 2018-04-16 DIAGNOSIS — Z6825 Body mass index (BMI) 25.0-25.9, adult: Secondary | ICD-10-CM | POA: Diagnosis not present

## 2018-04-16 DIAGNOSIS — E663 Overweight: Secondary | ICD-10-CM | POA: Diagnosis not present

## 2018-04-16 DIAGNOSIS — E785 Hyperlipidemia, unspecified: Secondary | ICD-10-CM | POA: Diagnosis not present

## 2018-04-16 DIAGNOSIS — R7301 Impaired fasting glucose: Secondary | ICD-10-CM | POA: Diagnosis not present

## 2018-04-16 DIAGNOSIS — R600 Localized edema: Secondary | ICD-10-CM | POA: Diagnosis not present

## 2018-04-16 DIAGNOSIS — E559 Vitamin D deficiency, unspecified: Secondary | ICD-10-CM | POA: Diagnosis not present

## 2018-04-16 DIAGNOSIS — Z1339 Encounter for screening examination for other mental health and behavioral disorders: Secondary | ICD-10-CM | POA: Diagnosis not present

## 2018-04-16 DIAGNOSIS — M8589 Other specified disorders of bone density and structure, multiple sites: Secondary | ICD-10-CM | POA: Diagnosis not present

## 2018-04-16 NOTE — Progress Notes (Signed)
Remote pacemaker transmission.   

## 2018-04-17 ENCOUNTER — Encounter: Payer: Self-pay | Admitting: Cardiology

## 2018-04-21 DIAGNOSIS — M533 Sacrococcygeal disorders, not elsewhere classified: Secondary | ICD-10-CM | POA: Diagnosis not present

## 2018-04-21 DIAGNOSIS — M545 Low back pain: Secondary | ICD-10-CM | POA: Diagnosis not present

## 2018-04-23 LAB — CUP PACEART REMOTE DEVICE CHECK
Battery Remaining Longevity: 98 mo
Battery Voltage: 3.02 V
Brady Statistic AS VS Percent: 0.14 %
Implantable Lead Implant Date: 20081014
Implantable Lead Location: 753859
Implantable Lead Model: 5076
Implantable Lead Model: 5076
Implantable Pulse Generator Implant Date: 20170911
Lead Channel Impedance Value: 418 Ohm
Lead Channel Impedance Value: 475 Ohm
Lead Channel Pacing Threshold Amplitude: 0.5 V
Lead Channel Pacing Threshold Amplitude: 0.75 V
Lead Channel Pacing Threshold Pulse Width: 0.4 ms
Lead Channel Sensing Intrinsic Amplitude: 1.5 mV
Lead Channel Sensing Intrinsic Amplitude: 1.5 mV
MDC IDC LEAD IMPLANT DT: 20081014
MDC IDC LEAD LOCATION: 753860
MDC IDC MSMT LEADCHNL RA PACING THRESHOLD PULSEWIDTH: 0.4 ms
MDC IDC MSMT LEADCHNL RV IMPEDANCE VALUE: 475 Ohm
MDC IDC MSMT LEADCHNL RV IMPEDANCE VALUE: 608 Ohm
MDC IDC MSMT LEADCHNL RV SENSING INTR AMPL: 9.125 mV
MDC IDC MSMT LEADCHNL RV SENSING INTR AMPL: 9.125 mV
MDC IDC SESS DTM: 20190718151446
MDC IDC SET LEADCHNL RA PACING AMPLITUDE: 2 V
MDC IDC SET LEADCHNL RV PACING AMPLITUDE: 2.5 V
MDC IDC SET LEADCHNL RV PACING PULSEWIDTH: 0.4 ms
MDC IDC SET LEADCHNL RV SENSING SENSITIVITY: 5.6 mV
MDC IDC STAT BRADY AP VP PERCENT: 0.16 %
MDC IDC STAT BRADY AP VS PERCENT: 0 %
MDC IDC STAT BRADY AS VP PERCENT: 99.69 %
MDC IDC STAT BRADY RA PERCENT PACED: 0.16 %
MDC IDC STAT BRADY RV PERCENT PACED: 99.83 %

## 2018-04-28 DIAGNOSIS — M533 Sacrococcygeal disorders, not elsewhere classified: Secondary | ICD-10-CM | POA: Diagnosis not present

## 2018-05-04 ENCOUNTER — Other Ambulatory Visit: Payer: Self-pay | Admitting: Physician Assistant

## 2018-05-04 DIAGNOSIS — M533 Sacrococcygeal disorders, not elsewhere classified: Secondary | ICD-10-CM

## 2018-05-07 ENCOUNTER — Ambulatory Visit
Admission: RE | Admit: 2018-05-07 | Discharge: 2018-05-07 | Disposition: A | Discharge status: Home / Self Care | Payer: PPO | Source: Ambulatory Visit | Attending: Physician Assistant | Admitting: Physician Assistant

## 2018-05-07 DIAGNOSIS — S3993XA Unspecified injury of pelvis, initial encounter: Secondary | ICD-10-CM | POA: Diagnosis not present

## 2018-05-07 DIAGNOSIS — M533 Sacrococcygeal disorders, not elsewhere classified: Secondary | ICD-10-CM

## 2018-05-14 DIAGNOSIS — B351 Tinea unguium: Secondary | ICD-10-CM | POA: Diagnosis not present

## 2018-05-14 DIAGNOSIS — L578 Other skin changes due to chronic exposure to nonionizing radiation: Secondary | ICD-10-CM | POA: Diagnosis not present

## 2018-05-14 DIAGNOSIS — L570 Actinic keratosis: Secondary | ICD-10-CM | POA: Diagnosis not present

## 2018-05-14 DIAGNOSIS — L821 Other seborrheic keratosis: Secondary | ICD-10-CM | POA: Diagnosis not present

## 2018-05-21 DIAGNOSIS — Z9181 History of falling: Secondary | ICD-10-CM | POA: Diagnosis not present

## 2018-05-21 DIAGNOSIS — Z0000 Encounter for general adult medical examination without abnormal findings: Secondary | ICD-10-CM | POA: Diagnosis not present

## 2018-05-21 DIAGNOSIS — Z1331 Encounter for screening for depression: Secondary | ICD-10-CM | POA: Diagnosis not present

## 2018-05-21 DIAGNOSIS — N959 Unspecified menopausal and perimenopausal disorder: Secondary | ICD-10-CM | POA: Diagnosis not present

## 2018-05-21 DIAGNOSIS — E785 Hyperlipidemia, unspecified: Secondary | ICD-10-CM | POA: Diagnosis not present

## 2018-06-11 DIAGNOSIS — L820 Inflamed seborrheic keratosis: Secondary | ICD-10-CM | POA: Diagnosis not present

## 2018-06-11 DIAGNOSIS — L821 Other seborrheic keratosis: Secondary | ICD-10-CM | POA: Diagnosis not present

## 2018-06-15 DIAGNOSIS — M8589 Other specified disorders of bone density and structure, multiple sites: Secondary | ICD-10-CM | POA: Diagnosis not present

## 2018-06-26 DIAGNOSIS — T63443A Toxic effect of venom of bees, assault, initial encounter: Secondary | ICD-10-CM | POA: Diagnosis not present

## 2018-07-16 ENCOUNTER — Ambulatory Visit: Payer: PPO | Admitting: *Deleted

## 2018-07-16 ENCOUNTER — Telehealth: Payer: Self-pay | Admitting: Cardiology

## 2018-07-16 NOTE — Telephone Encounter (Signed)
Spoke with pt and reminded pt of remote transmission that is due today. Pt verbalized understanding.   

## 2018-07-17 ENCOUNTER — Ambulatory Visit (INDEPENDENT_AMBULATORY_CARE_PROVIDER_SITE_OTHER): Payer: PPO | Admitting: *Deleted

## 2018-07-17 ENCOUNTER — Encounter: Payer: Self-pay | Admitting: Cardiology

## 2018-07-17 DIAGNOSIS — I442 Atrioventricular block, complete: Secondary | ICD-10-CM

## 2018-07-20 NOTE — Progress Notes (Signed)
Remote pacemaker transmission.   

## 2018-07-21 ENCOUNTER — Encounter: Payer: Self-pay | Admitting: Cardiology

## 2018-07-22 ENCOUNTER — Ambulatory Visit: Payer: PPO | Admitting: Internal Medicine

## 2018-07-22 ENCOUNTER — Encounter: Payer: Self-pay | Admitting: Internal Medicine

## 2018-07-22 VITALS — BP 116/70 | HR 59 | Ht 65.5 in | Wt 147.0 lb

## 2018-07-22 DIAGNOSIS — Z950 Presence of cardiac pacemaker: Secondary | ICD-10-CM

## 2018-07-22 DIAGNOSIS — I442 Atrioventricular block, complete: Secondary | ICD-10-CM | POA: Diagnosis not present

## 2018-07-22 DIAGNOSIS — I10 Essential (primary) hypertension: Secondary | ICD-10-CM

## 2018-07-22 NOTE — Progress Notes (Signed)
Patient Care Team: Nicoletta Dress, MD as PCP - General (Internal Medicine)   HPI  Amanda Bryant is a 77 y.o. female Seen in followup for syncope which turned out to be associated by intermittent complete heart block documented by loop recorder insertion 2008 and subsequently replaced by a dual-chamber pacemaker-Medtronic also in 2008. She's had no recurrent syncope.   .    Her pacemaker reached ERI and reverted to VVI 9/17 and she underwent generator replacement.   Echo 8/17 EF normal   The patient denies chest pain, shortness of breath, nocturnal dyspnea, orthopnea or peripheral edema.  There have been no palpitations, lightheadedness or syncope.     Past Medical History:  Diagnosis Date  . Arthritis   . Complete heart block (HCC)    intermittent, documented by prev ILR  . HTN (hypertension)   . Pacemaker    Implanted 2008  . PVC's (premature ventricular contractions)   . S/P cardiac catheterization    2001- nonobstructive disease; nl lv function    Past Surgical History:  Procedure Laterality Date  . CHOLECYSTECTOMY    . COLONOSCOPY  07/22/2011   Colonic Polyp, mild sigmoid diverticulosis, small internal hemorrhoids. Bx: hyperplastic polyps.  . EP IMPLANTABLE DEVICE N/A 06/10/2016   Procedure: PPM Generator Changeout;  Surgeon: Will Meredith Leeds, MD;  Location: [facility];  Service: Cardiovascular;  Laterality: N/A;  . ERCP     Normal cholangiogram- no hilar/righthepatic duct stricture visualized.    Current Outpatient Medications  Medication Sig Dispense Refill  . acetaminophen (TYLENOL) 500 MG tablet Take 500 mg by mouth every 6 (six) hours as needed for mild pain.    Marland Kitchen alendronate (FOSAMAX) 70 MG tablet Take 70 mg by mouth every 7 (seven) days. Take with a full glass of water on an empty stomach.    Marland Kitchen aspirin EC 81 MG tablet Take 81 mg by mouth daily as needed for mild pain.    . Calcium Carbonate (CALTRATE 600 PO) Take 1 tablet by mouth  daily.     . carvedilol (COREG) 6.25 MG tablet Take 6.25 mg by mouth 2 (two) times daily.    . Cholecalciferol (VITAMIN D) 1000 UNITS capsule Take 1,000 Units by mouth daily.      . fish oil-omega-3 fatty acids 1000 MG capsule Take 2 g by mouth daily.      Marland Kitchen losartan (COZAAR) 50 MG tablet Take 50 mg by mouth daily.    Marland Kitchen omeprazole (PRILOSEC) 20 MG capsule Take 20 mg by mouth daily.      . promethazine (PHENERGAN) 25 MG tablet Take 1 tablet (25 mg total) by mouth every 8 (eight) hours as needed for nausea or vomiting. 30 tablet 2  . venlafaxine (EFFEXOR-XR) 37.5 MG 24 hr capsule Take 37.5 mg by mouth daily.      Current Facility-Administered Medications  Medication Dose Route Frequency Provider Last Rate Last Dose  . 0.9 %  sodium chloride infusion  500 mL Intravenous Once Jackquline Denmark, MD      . triamcinolone acetonide (KENALOG-40) injection 20 mg  20 mg Other Once Landis Martins, DPM        Allergies  Allergen Reactions  . Codeine Nausea Only  . Prednisone Other (See Comments)    Reaction: Increased heart rate  . Ultram [Tramadol Hcl] Itching  . Tape     Pulls skin off     Review of Systems negative except from HPI and PMH  Physical Exam BP 116/70   Pulse (!) 59   Ht 5' 5.5" (1.664 m)   Wt 147 lb (66.7 kg)   SpO2 95%   BMI 24.09 kg/m  Well developed and nourished in no acute distress HENT normal Neck supple with JVP-flat Clear Regular rate and rhythm,  2/6 m Abd-soft with active BS No Clubbing cyanosis edema Skin-warm and dry A & Oriented  Grossly normal sensory and motor function    ECG demonstrates sinus with P-synchronous/ AV  pacing    Assessment and  Plan  Complete heart block  Stable   Pacemaker-Medtronic  The patient's device was interrogated.  The information was reviewed. No changes were made in the programming.     Hypertension    Syncope     BP well controlled   No syncope

## 2018-07-22 NOTE — Patient Instructions (Signed)
Medication Instructions:  Your physician recommends that you continue on your current medications as directed. Please refer to the Current Medication list given to you today.  Labwork: None ordered.  Testing/Procedures: None ordered.  Follow-Up: Your physician recommends that you schedule a follow-up appointment in:   One Year with Dr Caryl Comes  Remote monitoring is used to monitor your Pacemaker of ICD from home. This monitoring reduces the number of office visits required to check your device to one time per year. It allows [place] to keep an eye on the functioning of your device to ensure it is working properly. You are scheduled for a device check from home on 10/19/2018. You may send your transmission at any time that day. If you have a wireless device, the transmission will be sent automatically. After your physician reviews your transmission, you will receive a postcard with your next transmission date.    Any Other Special Instructions Will Be Listed Below (If Applicable).     If you need a refill on your cardiac medications before your next appointment, please call your pharmacy.

## 2018-08-03 DIAGNOSIS — J208 Acute bronchitis due to other specified organisms: Secondary | ICD-10-CM | POA: Diagnosis not present

## 2018-08-06 DIAGNOSIS — Z23 Encounter for immunization: Secondary | ICD-10-CM | POA: Diagnosis not present

## 2018-08-06 DIAGNOSIS — E559 Vitamin D deficiency, unspecified: Secondary | ICD-10-CM | POA: Diagnosis not present

## 2018-08-06 DIAGNOSIS — R600 Localized edema: Secondary | ICD-10-CM | POA: Diagnosis not present

## 2018-08-06 DIAGNOSIS — R7301 Impaired fasting glucose: Secondary | ICD-10-CM | POA: Diagnosis not present

## 2018-08-06 DIAGNOSIS — E785 Hyperlipidemia, unspecified: Secondary | ICD-10-CM | POA: Diagnosis not present

## 2018-08-06 DIAGNOSIS — M810 Age-related osteoporosis without current pathological fracture: Secondary | ICD-10-CM | POA: Diagnosis not present

## 2018-08-06 DIAGNOSIS — G2581 Restless legs syndrome: Secondary | ICD-10-CM | POA: Diagnosis not present

## 2018-08-06 DIAGNOSIS — Z6824 Body mass index (BMI) 24.0-24.9, adult: Secondary | ICD-10-CM | POA: Diagnosis not present

## 2018-08-06 DIAGNOSIS — F418 Other specified anxiety disorders: Secondary | ICD-10-CM | POA: Diagnosis not present

## 2018-08-06 DIAGNOSIS — I10 Essential (primary) hypertension: Secondary | ICD-10-CM | POA: Diagnosis not present

## 2018-08-10 DIAGNOSIS — Z6824 Body mass index (BMI) 24.0-24.9, adult: Secondary | ICD-10-CM | POA: Diagnosis not present

## 2018-08-10 DIAGNOSIS — J019 Acute sinusitis, unspecified: Secondary | ICD-10-CM | POA: Diagnosis not present

## 2018-08-29 LAB — CUP PACEART INCLINIC DEVICE CHECK
Battery Remaining Longevity: 95 mo
Brady Statistic AP VP Percent: 0.17 %
Brady Statistic RA Percent Paced: 0.17 %
Brady Statistic RV Percent Paced: 99.95 %
Date Time Interrogation Session: 20191023182328
Implantable Lead Location: 753859
Implantable Lead Location: 753860
Implantable Lead Model: 5076
Implantable Lead Model: 5076
Lead Channel Impedance Value: 475 Ohm
Lead Channel Impedance Value: 494 Ohm
Lead Channel Pacing Threshold Amplitude: 0.5 V
Lead Channel Pacing Threshold Pulse Width: 0.4 ms
Lead Channel Sensing Intrinsic Amplitude: 1.375 mV
Lead Channel Setting Pacing Amplitude: 2 V
Lead Channel Setting Pacing Amplitude: 2.5 V
Lead Channel Setting Pacing Pulse Width: 0.4 ms
Lead Channel Setting Sensing Sensitivity: 5.6 mV
MDC IDC LEAD IMPLANT DT: 20081014
MDC IDC LEAD IMPLANT DT: 20081014
MDC IDC MSMT BATTERY VOLTAGE: 3.01 V
MDC IDC MSMT LEADCHNL RA IMPEDANCE VALUE: 437 Ohm
MDC IDC MSMT LEADCHNL RV IMPEDANCE VALUE: 608 Ohm
MDC IDC MSMT LEADCHNL RV PACING THRESHOLD AMPLITUDE: 0.75 V
MDC IDC MSMT LEADCHNL RV PACING THRESHOLD PULSEWIDTH: 0.4 ms
MDC IDC PG IMPLANT DT: 20170911
MDC IDC STAT BRADY AP VS PERCENT: 0 %
MDC IDC STAT BRADY AS VP PERCENT: 99.79 %
MDC IDC STAT BRADY AS VS PERCENT: 0.04 %

## 2018-09-17 LAB — CUP PACEART REMOTE DEVICE CHECK
Brady Statistic AS VP Percent: 99.8 %
Brady Statistic RA Percent Paced: 0.19 %
Date Time Interrogation Session: 20191018185425
Implantable Lead Location: 753859
Lead Channel Impedance Value: 418 Ohm
Lead Channel Impedance Value: 494 Ohm
Lead Channel Pacing Threshold Amplitude: 0.625 V
Lead Channel Pacing Threshold Pulse Width: 0.4 ms
Lead Channel Pacing Threshold Pulse Width: 0.4 ms
Lead Channel Sensing Intrinsic Amplitude: 12.25 mV
Lead Channel Sensing Intrinsic Amplitude: 12.25 mV
Lead Channel Setting Pacing Amplitude: 2.5 V
Lead Channel Setting Pacing Pulse Width: 0.4 ms
Lead Channel Setting Sensing Sensitivity: 5.6 mV
MDC IDC LEAD IMPLANT DT: 20081014
MDC IDC LEAD IMPLANT DT: 20081014
MDC IDC LEAD LOCATION: 753860
MDC IDC MSMT BATTERY REMAINING LONGEVITY: 95 mo
MDC IDC MSMT BATTERY VOLTAGE: 3.02 V
MDC IDC MSMT LEADCHNL RA PACING THRESHOLD AMPLITUDE: 0.5 V
MDC IDC MSMT LEADCHNL RA SENSING INTR AMPL: 1.25 mV
MDC IDC MSMT LEADCHNL RA SENSING INTR AMPL: 1.25 mV
MDC IDC MSMT LEADCHNL RV IMPEDANCE VALUE: 513 Ohm
MDC IDC MSMT LEADCHNL RV IMPEDANCE VALUE: 646 Ohm
MDC IDC PG IMPLANT DT: 20170911
MDC IDC SET LEADCHNL RA PACING AMPLITUDE: 2 V
MDC IDC STAT BRADY AP VP PERCENT: 0.19 %
MDC IDC STAT BRADY AP VS PERCENT: 0 %
MDC IDC STAT BRADY AS VS PERCENT: 0.01 %
MDC IDC STAT BRADY RV PERCENT PACED: 99.98 %

## 2018-10-23 ENCOUNTER — Ambulatory Visit (INDEPENDENT_AMBULATORY_CARE_PROVIDER_SITE_OTHER): Payer: PPO

## 2018-10-23 ENCOUNTER — Telehealth: Payer: Self-pay

## 2018-10-23 DIAGNOSIS — I442 Atrioventricular block, complete: Secondary | ICD-10-CM | POA: Diagnosis not present

## 2018-10-23 NOTE — Telephone Encounter (Signed)
Spoke with patient to remind of missed remote transmission 

## 2018-10-24 LAB — CUP PACEART REMOTE DEVICE CHECK
Brady Statistic AP VP Percent: 0.32 %
Brady Statistic AP VS Percent: 0 %
Brady Statistic AS VP Percent: 99.4 %
Brady Statistic RV Percent Paced: 99.54 %
Implantable Lead Implant Date: 20081014
Implantable Lead Model: 5076
Lead Channel Impedance Value: 475 Ohm
Lead Channel Impedance Value: 608 Ohm
Lead Channel Sensing Intrinsic Amplitude: 1.875 mV
Lead Channel Sensing Intrinsic Amplitude: 11.25 mV
Lead Channel Sensing Intrinsic Amplitude: 11.25 mV
Lead Channel Setting Pacing Amplitude: 2 V
Lead Channel Setting Pacing Amplitude: 2.5 V
Lead Channel Setting Pacing Pulse Width: 0.4 ms
Lead Channel Setting Sensing Sensitivity: 5.6 mV
MDC IDC LEAD IMPLANT DT: 20081014
MDC IDC LEAD LOCATION: 753859
MDC IDC LEAD LOCATION: 753860
MDC IDC MSMT BATTERY REMAINING LONGEVITY: 93 mo
MDC IDC MSMT BATTERY VOLTAGE: 3.01 V
MDC IDC MSMT LEADCHNL RA IMPEDANCE VALUE: 437 Ohm
MDC IDC MSMT LEADCHNL RA IMPEDANCE VALUE: 475 Ohm
MDC IDC MSMT LEADCHNL RA PACING THRESHOLD AMPLITUDE: 0.5 V
MDC IDC MSMT LEADCHNL RA PACING THRESHOLD PULSEWIDTH: 0.4 ms
MDC IDC MSMT LEADCHNL RA SENSING INTR AMPL: 1.875 mV
MDC IDC MSMT LEADCHNL RV PACING THRESHOLD AMPLITUDE: 0.75 V
MDC IDC MSMT LEADCHNL RV PACING THRESHOLD PULSEWIDTH: 0.4 ms
MDC IDC PG IMPLANT DT: 20170911
MDC IDC SESS DTM: 20200124183541
MDC IDC STAT BRADY AS VS PERCENT: 0.28 %
MDC IDC STAT BRADY RA PERCENT PACED: 0.32 %

## 2018-10-26 NOTE — Progress Notes (Signed)
Remote pacemaker transmission.   

## 2018-11-18 DIAGNOSIS — S81802A Unspecified open wound, left lower leg, initial encounter: Secondary | ICD-10-CM | POA: Diagnosis not present

## 2018-11-18 DIAGNOSIS — E559 Vitamin D deficiency, unspecified: Secondary | ICD-10-CM | POA: Diagnosis not present

## 2018-11-18 DIAGNOSIS — F418 Other specified anxiety disorders: Secondary | ICD-10-CM | POA: Diagnosis not present

## 2018-11-18 DIAGNOSIS — G2581 Restless legs syndrome: Secondary | ICD-10-CM | POA: Diagnosis not present

## 2018-11-18 DIAGNOSIS — Z6824 Body mass index (BMI) 24.0-24.9, adult: Secondary | ICD-10-CM | POA: Diagnosis not present

## 2018-11-18 DIAGNOSIS — I10 Essential (primary) hypertension: Secondary | ICD-10-CM | POA: Diagnosis not present

## 2018-11-18 DIAGNOSIS — E785 Hyperlipidemia, unspecified: Secondary | ICD-10-CM | POA: Diagnosis not present

## 2018-11-18 DIAGNOSIS — R600 Localized edema: Secondary | ICD-10-CM | POA: Diagnosis not present

## 2018-11-18 DIAGNOSIS — R7301 Impaired fasting glucose: Secondary | ICD-10-CM | POA: Diagnosis not present

## 2018-11-18 DIAGNOSIS — M810 Age-related osteoporosis without current pathological fracture: Secondary | ICD-10-CM | POA: Diagnosis not present

## 2018-11-18 DIAGNOSIS — Z23 Encounter for immunization: Secondary | ICD-10-CM | POA: Diagnosis not present

## 2019-01-25 ENCOUNTER — Other Ambulatory Visit: Payer: Self-pay

## 2019-01-25 ENCOUNTER — Ambulatory Visit (INDEPENDENT_AMBULATORY_CARE_PROVIDER_SITE_OTHER): Payer: PPO | Admitting: *Deleted

## 2019-01-25 DIAGNOSIS — I442 Atrioventricular block, complete: Secondary | ICD-10-CM

## 2019-01-26 ENCOUNTER — Telehealth: Payer: Self-pay

## 2019-01-26 NOTE — Telephone Encounter (Signed)
Left message for patient to remind of missed remote transmission.  

## 2019-01-27 LAB — CUP PACEART REMOTE DEVICE CHECK
Battery Remaining Longevity: 84 mo
Battery Voltage: 3.01 V
Brady Statistic AP VP Percent: 0.08 %
Brady Statistic AP VS Percent: 0 %
Brady Statistic AS VP Percent: 99.9 %
Brady Statistic AS VS Percent: 0.02 %
Brady Statistic RA Percent Paced: 0.08 %
Brady Statistic RV Percent Paced: 99.97 %
Date Time Interrogation Session: 20200428200923
Implantable Lead Implant Date: 20081014
Implantable Lead Implant Date: 20081014
Implantable Lead Location: 753859
Implantable Lead Location: 753860
Implantable Lead Model: 5076
Implantable Lead Model: 5076
Implantable Pulse Generator Implant Date: 20170911
Lead Channel Impedance Value: 437 Ohm
Lead Channel Impedance Value: 494 Ohm
Lead Channel Impedance Value: 494 Ohm
Lead Channel Impedance Value: 627 Ohm
Lead Channel Pacing Threshold Amplitude: 0.375 V
Lead Channel Pacing Threshold Amplitude: 0.75 V
Lead Channel Pacing Threshold Pulse Width: 0.4 ms
Lead Channel Pacing Threshold Pulse Width: 0.4 ms
Lead Channel Sensing Intrinsic Amplitude: 11.25 mV
Lead Channel Sensing Intrinsic Amplitude: 11.25 mV
Lead Channel Sensing Intrinsic Amplitude: 2 mV
Lead Channel Sensing Intrinsic Amplitude: 2 mV
Lead Channel Setting Pacing Amplitude: 2 V
Lead Channel Setting Pacing Amplitude: 2.5 V
Lead Channel Setting Pacing Pulse Width: 0.4 ms
Lead Channel Setting Sensing Sensitivity: 5.6 mV

## 2019-02-03 NOTE — Progress Notes (Signed)
Remote pacemaker transmission.   

## 2019-02-19 DIAGNOSIS — J019 Acute sinusitis, unspecified: Secondary | ICD-10-CM | POA: Diagnosis not present

## 2019-03-15 DIAGNOSIS — H02834 Dermatochalasis of left upper eyelid: Secondary | ICD-10-CM | POA: Diagnosis not present

## 2019-03-15 DIAGNOSIS — H1851 Endothelial corneal dystrophy: Secondary | ICD-10-CM | POA: Diagnosis not present

## 2019-03-15 DIAGNOSIS — H0102A Squamous blepharitis right eye, upper and lower eyelids: Secondary | ICD-10-CM | POA: Diagnosis not present

## 2019-03-15 DIAGNOSIS — H35411 Lattice degeneration of retina, right eye: Secondary | ICD-10-CM | POA: Diagnosis not present

## 2019-03-15 DIAGNOSIS — H02831 Dermatochalasis of right upper eyelid: Secondary | ICD-10-CM | POA: Diagnosis not present

## 2019-03-15 DIAGNOSIS — H2513 Age-related nuclear cataract, bilateral: Secondary | ICD-10-CM | POA: Diagnosis not present

## 2019-03-15 DIAGNOSIS — H0102B Squamous blepharitis left eye, upper and lower eyelids: Secondary | ICD-10-CM | POA: Diagnosis not present

## 2019-03-15 DIAGNOSIS — H04123 Dry eye syndrome of bilateral lacrimal glands: Secondary | ICD-10-CM | POA: Diagnosis not present

## 2019-03-15 DIAGNOSIS — H43811 Vitreous degeneration, right eye: Secondary | ICD-10-CM | POA: Diagnosis not present

## 2019-04-26 ENCOUNTER — Ambulatory Visit (INDEPENDENT_AMBULATORY_CARE_PROVIDER_SITE_OTHER): Payer: PPO | Admitting: *Deleted

## 2019-04-26 DIAGNOSIS — I442 Atrioventricular block, complete: Secondary | ICD-10-CM | POA: Diagnosis not present

## 2019-04-27 LAB — CUP PACEART REMOTE DEVICE CHECK
Battery Remaining Longevity: 80 mo
Battery Voltage: 3.01 V
Brady Statistic AP VP Percent: 0.24 %
Brady Statistic AP VS Percent: 0 %
Brady Statistic AS VP Percent: 99.72 %
Brady Statistic AS VS Percent: 0.04 %
Brady Statistic RA Percent Paced: 0.24 %
Brady Statistic RV Percent Paced: 99.95 %
Date Time Interrogation Session: 20200727221748
Implantable Lead Implant Date: 20081014
Implantable Lead Implant Date: 20081014
Implantable Lead Location: 753859
Implantable Lead Location: 753860
Implantable Lead Model: 5076
Implantable Lead Model: 5076
Implantable Pulse Generator Implant Date: 20170911
Lead Channel Impedance Value: 456 Ohm
Lead Channel Impedance Value: 494 Ohm
Lead Channel Impedance Value: 532 Ohm
Lead Channel Impedance Value: 665 Ohm
Lead Channel Pacing Threshold Amplitude: 0.375 V
Lead Channel Pacing Threshold Amplitude: 0.75 V
Lead Channel Pacing Threshold Pulse Width: 0.4 ms
Lead Channel Pacing Threshold Pulse Width: 0.4 ms
Lead Channel Sensing Intrinsic Amplitude: 13.875 mV
Lead Channel Sensing Intrinsic Amplitude: 13.875 mV
Lead Channel Sensing Intrinsic Amplitude: 2.25 mV
Lead Channel Sensing Intrinsic Amplitude: 2.25 mV
Lead Channel Setting Pacing Amplitude: 2 V
Lead Channel Setting Pacing Amplitude: 2.5 V
Lead Channel Setting Pacing Pulse Width: 0.4 ms
Lead Channel Setting Sensing Sensitivity: 5.6 mV

## 2019-05-14 ENCOUNTER — Encounter: Payer: Self-pay | Admitting: Cardiology

## 2019-05-14 NOTE — Progress Notes (Signed)
Remote pacemaker transmission.   

## 2019-05-19 ENCOUNTER — Other Ambulatory Visit: Payer: Self-pay

## 2019-05-19 ENCOUNTER — Encounter: Payer: Self-pay | Admitting: Sports Medicine

## 2019-05-19 ENCOUNTER — Ambulatory Visit (INDEPENDENT_AMBULATORY_CARE_PROVIDER_SITE_OTHER): Payer: PPO

## 2019-05-19 ENCOUNTER — Other Ambulatory Visit: Payer: Self-pay | Admitting: Sports Medicine

## 2019-05-19 ENCOUNTER — Ambulatory Visit (INDEPENDENT_AMBULATORY_CARE_PROVIDER_SITE_OTHER): Payer: PPO | Admitting: Sports Medicine

## 2019-05-19 DIAGNOSIS — M7752 Other enthesopathy of left foot: Secondary | ICD-10-CM

## 2019-05-19 DIAGNOSIS — M779 Enthesopathy, unspecified: Secondary | ICD-10-CM | POA: Diagnosis not present

## 2019-05-19 DIAGNOSIS — M79671 Pain in right foot: Secondary | ICD-10-CM | POA: Diagnosis not present

## 2019-05-19 DIAGNOSIS — M778 Other enthesopathies, not elsewhere classified: Secondary | ICD-10-CM

## 2019-05-19 DIAGNOSIS — M722 Plantar fascial fibromatosis: Secondary | ICD-10-CM

## 2019-05-19 DIAGNOSIS — L600 Ingrowing nail: Secondary | ICD-10-CM

## 2019-05-19 DIAGNOSIS — M79672 Pain in left foot: Secondary | ICD-10-CM

## 2019-05-19 DIAGNOSIS — M7731 Calcaneal spur, right foot: Secondary | ICD-10-CM | POA: Diagnosis not present

## 2019-05-19 MED ORDER — TRIAMCINOLONE ACETONIDE 10 MG/ML IJ SUSP: 10.0000 mg | Freq: Once | INTRAMUSCULAR | Status: DC

## 2019-05-19 MED ORDER — NEOMYCIN-POLYMYXIN-HC 3.5-10000-1 OT SOLN: OTIC | 0 refills | Status: DC

## 2019-05-19 NOTE — Progress Notes (Signed)
Subjective: Amanda Bryant is a 78 y.o. female patient presents to office with complaint of 1, moderate pain to the bottom of the right heel since the summer states that pain is 5 out of 10 sharp in nature worse with walking or standing and direct pressure to her feet.  Patient has tried elevation with no improvement in symptoms.  Patient denies any trauma or injury to this area.  Patient also admits to 2.  Pain at the ball of the left foot that has been going on for over a year 8 out of 10 tenderness pain is worse with pressure reports that she has a new pair of shoes that she wears it helps the ball but it rubs the inside of her foot so she has to be careful and has tried changing shoes often to avoid rubbing.  Patient also admits to an issue with pain around her 3.  Toenail reports that she goes to get pedicures to have her toenails trimmed and reports that sometimes the nail deep down into the skin and stays sore painful off and on for the last year reports that pedicures help from time to time denies any redness warmth swelling or drainage.  No other pedal complaints noted.  Patient Active Problem List   Diagnosis Date Noted  . Pain in the coccyx 04/21/2018  . Complete heart block ([place]) 06/10/2016  . Atrioventricular block, complete ([place]) 10/10/2011  . Pacemaker-Medtronic   . HTN (hypertension)   . PVC's (premature ventricular contractions)     Current Outpatient Medications on File Prior to Visit  Medication Sig Dispense Refill  . acetaminophen (TYLENOL) 500 MG tablet Take 500 mg by mouth every 6 (six) hours as needed for mild pain.    Marland Kitchen alendronate (FOSAMAX) 70 MG tablet Take 70 mg by mouth every 7 (seven) days. Take with a full glass of water on an empty stomach.    Marland Kitchen aspirin EC 81 MG tablet Take 81 mg by mouth daily as needed for mild pain.    . Calcium Carbonate (CALTRATE 600 PO) Take 1 tablet by mouth daily.     . carvedilol (COREG) 6.25 MG tablet Take 6.25 mg by mouth 2 (two) times  daily.    . Cholecalciferol (VITAMIN D) 1000 UNITS capsule Take 1,000 Units by mouth daily.      . fish oil-omega-3 fatty acids 1000 MG capsule Take 2 g by mouth daily.      Marland Kitchen losartan (COZAAR) 50 MG tablet Take 50 mg by mouth daily.    Marland Kitchen omeprazole (PRILOSEC) 20 MG capsule Take 20 mg by mouth daily.      . promethazine (PHENERGAN) 25 MG tablet Take 1 tablet (25 mg total) by mouth every 8 (eight) hours as needed for nausea or vomiting. 30 tablet 2  . venlafaxine (EFFEXOR-XR) 37.5 MG 24 hr capsule Take 37.5 mg by mouth daily.      Current Facility-Administered Medications on File Prior to Visit  Medication Dose Route Frequency Provider Last Rate Last Dose  . 0.9 %  sodium chloride infusion  500 mL Intravenous Once Jackquline Denmark, MD      . triamcinolone acetonide (KENALOG-40) injection 20 mg  20 mg Other Once Landis Martins, DPM        Allergies  Allergen Reactions  . Codeine Nausea Only  . Prednisone Other (See Comments)    Reaction: Increased heart rate  . Ultram [Tramadol Hcl] Itching  . Tape     Pulls skin off  Objective: Physical Exam General: The patient is alert and oriented x3 in no acute distress.  Dermatology: Skin is warm, dry and supple bilateral lower extremities.  There is mild incurvation noted at the left second toe medial border and right third toe lateral border with no signs of infection nails are polished and thickened there is no erythema, edema, no eccymosis, no open lesions present. Integument is otherwise unremarkable.  Vascular: Dorsalis Pedis pulse and Posterior Tibial pulse are 1/4 bilateral. Capillary fill time is immediate to all digits.  Neurological: Grossly intact via light touch bilateral.  Musculoskeletal: Tenderness to palpation at the medial calcaneal tubercale and through the insertion of the plantar fascia on the right foot.  There is also tenderness to palpation plantar aspect of the second metatarsal phalangeal joint at the medial aspect  plantarly on the left foot.  There is mild tenderness to palpation to the incurvated nails at left second toe and right third toe.  There is limited range of motion bilateral with significant fat pad atrophy again digital deviation and contracture.  Gait: Unassisted, Antalgic avoid weight on right heel.  Xray, Right/Left foot:  Normal osseous mineralization. Joint spaces preserved except at midfoot consistent with arthritis and ankle as well as lesser toe joints with digital deviation. No fracture/dislocation/boney destruction. Calcaneal spur present with mild thickening of plantar fascia. No other soft tissue abnormalities or radiopaque foreign bodies.   Assessment and Plan: Problem List Items Addressed This Visit    None    Visit Diagnoses    Ingrown nail    -  Primary   Relevant Medications   neomycin-polymyxin-hydrocortisone (CORTISPORIN) OTIC solution   Capsulitis of foot, left       Plantar fasciitis, right       Heel spur, right       Foot pain, bilateral          -Complete examination performed.  -Xrays reviewed -Discussed with patient in detail the condition of plantar fasciitis, how this occurs and general treatment options. Explained both conservative and surgical treatments.  -After oral consent and aseptic prep, injected a mixture containing 1 ml of 2%  plain lidocaine, 1 ml 0.5% plain marcaine, 0.5 ml of kenalog 10 and 0.5 ml of dexamethasone phosphate into right heel via plantar approach at the area of most pain. Post-injection care discussed with patient.  -Recommended good supportive shoes and advised use of OTC and replacing her old ones -Dispensed heel lift today for the right -Discussed with patient in detail treatment options for capsulitis on left footAfter oral consent and aseptic prep, injected a mixture containing 1 ml of 2% plain lidocaine, 1 ml 0.5% plain marcaine, 0.5 ml of kenalog 10 and 0.5 ml of dexamethasone phosphate into left ball at the second  metatarsophalangeal joint without complication. Post-injection care discussed with patient.  -Recommend for both left and right foot gentle stretching and icing -Discussed with patient in detail treatment options for nail problem; advised patient since there is no acute infection there is no need for any procedure to be done at today's visit -And no additional charge mechanically trimmed out the offending nail borders then applied antibiotic cream and Band-Aid prescribed Corticosporin solution for patient to use the sore toenail corners as instructed for at least 1 week after 1 week if there is no soreness may discontinue use and keep for future nail problems and issues -Advised patient that she may continue to go for pedicures if she cannot keep her nails trimmed properly herself -  Patient to return to office as needed or sooner if problems or questions arise.  Landis Martins, DPM

## 2019-05-20 DIAGNOSIS — M810 Age-related osteoporosis without current pathological fracture: Secondary | ICD-10-CM | POA: Diagnosis not present

## 2019-05-20 DIAGNOSIS — G2581 Restless legs syndrome: Secondary | ICD-10-CM | POA: Diagnosis not present

## 2019-05-20 DIAGNOSIS — Z139 Encounter for screening, unspecified: Secondary | ICD-10-CM | POA: Diagnosis not present

## 2019-05-20 DIAGNOSIS — F418 Other specified anxiety disorders: Secondary | ICD-10-CM | POA: Diagnosis not present

## 2019-05-20 DIAGNOSIS — I10 Essential (primary) hypertension: Secondary | ICD-10-CM | POA: Diagnosis not present

## 2019-05-20 DIAGNOSIS — R600 Localized edema: Secondary | ICD-10-CM | POA: Diagnosis not present

## 2019-05-20 DIAGNOSIS — Z6823 Body mass index (BMI) 23.0-23.9, adult: Secondary | ICD-10-CM | POA: Diagnosis not present

## 2019-05-20 DIAGNOSIS — E559 Vitamin D deficiency, unspecified: Secondary | ICD-10-CM | POA: Diagnosis not present

## 2019-05-20 DIAGNOSIS — R7301 Impaired fasting glucose: Secondary | ICD-10-CM | POA: Diagnosis not present

## 2019-05-20 DIAGNOSIS — E785 Hyperlipidemia, unspecified: Secondary | ICD-10-CM | POA: Diagnosis not present

## 2019-05-20 DIAGNOSIS — Z1231 Encounter for screening mammogram for malignant neoplasm of breast: Secondary | ICD-10-CM | POA: Diagnosis not present

## 2019-05-26 DIAGNOSIS — Z9181 History of falling: Secondary | ICD-10-CM | POA: Diagnosis not present

## 2019-05-26 DIAGNOSIS — Z136 Encounter for screening for cardiovascular disorders: Secondary | ICD-10-CM | POA: Diagnosis not present

## 2019-05-26 DIAGNOSIS — Z1231 Encounter for screening mammogram for malignant neoplasm of breast: Secondary | ICD-10-CM | POA: Diagnosis not present

## 2019-05-26 DIAGNOSIS — Z0000 Encounter for general adult medical examination without abnormal findings: Secondary | ICD-10-CM | POA: Diagnosis not present

## 2019-05-26 DIAGNOSIS — Z1331 Encounter for screening for depression: Secondary | ICD-10-CM | POA: Diagnosis not present

## 2019-05-26 DIAGNOSIS — E785 Hyperlipidemia, unspecified: Secondary | ICD-10-CM | POA: Diagnosis not present

## 2019-05-31 ENCOUNTER — Other Ambulatory Visit: Payer: Self-pay | Admitting: Sports Medicine

## 2019-05-31 DIAGNOSIS — M79671 Pain in right foot: Secondary | ICD-10-CM

## 2019-05-31 DIAGNOSIS — M722 Plantar fascial fibromatosis: Secondary | ICD-10-CM

## 2019-05-31 DIAGNOSIS — M7752 Other enthesopathy of left foot: Secondary | ICD-10-CM

## 2019-06-03 DIAGNOSIS — Z803 Family history of malignant neoplasm of breast: Secondary | ICD-10-CM | POA: Diagnosis not present

## 2019-06-03 DIAGNOSIS — Z1231 Encounter for screening mammogram for malignant neoplasm of breast: Secondary | ICD-10-CM | POA: Diagnosis not present

## 2019-06-04 ENCOUNTER — Ambulatory Visit (INDEPENDENT_AMBULATORY_CARE_PROVIDER_SITE_OTHER): Payer: PPO | Admitting: Sports Medicine

## 2019-06-04 ENCOUNTER — Encounter: Payer: Self-pay | Admitting: Sports Medicine

## 2019-06-04 ENCOUNTER — Other Ambulatory Visit: Payer: Self-pay

## 2019-06-04 DIAGNOSIS — M79671 Pain in right foot: Secondary | ICD-10-CM

## 2019-06-04 DIAGNOSIS — M79672 Pain in left foot: Secondary | ICD-10-CM

## 2019-06-04 DIAGNOSIS — M779 Enthesopathy, unspecified: Secondary | ICD-10-CM

## 2019-06-04 DIAGNOSIS — M7731 Calcaneal spur, right foot: Secondary | ICD-10-CM

## 2019-06-04 DIAGNOSIS — M722 Plantar fascial fibromatosis: Secondary | ICD-10-CM | POA: Diagnosis not present

## 2019-06-04 DIAGNOSIS — M778 Other enthesopathies, not elsewhere classified: Secondary | ICD-10-CM

## 2019-06-04 MED ORDER — HYDROMORPHONE HCL 2 MG PO TABS: 2.0000 mg | ORAL_TABLET | Freq: Every day | ORAL | 0 refills | Status: DC

## 2019-06-04 NOTE — Progress Notes (Addendum)
Subjective: Amanda Bryant is a 78 y.o. female returns to office for follow up evaluation after Right heel injection for plantar fasciitis, injection #1 administered 3 weeks ago. Patient states that the injection seems to NOT HELP, Pain 10/10 sharp in nature and cant put any pressure on it as of this morning. Reports that her left foot pain is still the same. Patient denies any recent changes in medications or new problems since last visit.   Patient Active Problem List   Diagnosis Date Noted  . Pain in the coccyx 04/21/2018  . Complete heart block ([place]) 06/10/2016  . Atrioventricular block, complete ([place]) 10/10/2011  . Pacemaker-Medtronic   . HTN (hypertension)   . PVC's (premature ventricular contractions)     Current Outpatient Medications on File Prior to Visit  Medication Sig Dispense Refill  . acetaminophen (TYLENOL) 500 MG tablet Take 500 mg by mouth every 6 (six) hours as needed for mild pain.    Marland Kitchen alendronate (FOSAMAX) 70 MG tablet Take 70 mg by mouth every 7 (seven) days. Take with a full glass of water on an empty stomach.    Marland Kitchen aspirin EC 81 MG tablet Take 81 mg by mouth daily as needed for mild pain.    . Calcium Carbonate (CALTRATE 600 PO) Take 1 tablet by mouth daily.     . carvedilol (COREG) 6.25 MG tablet Take 6.25 mg by mouth 2 (two) times daily.    . Cholecalciferol (VITAMIN D) 1000 UNITS capsule Take 1,000 Units by mouth daily.      . fish oil-omega-3 fatty acids 1000 MG capsule Take 2 g by mouth daily.      . fluconazole (DIFLUCAN) 150 MG tablet TAKE 1 TABLET BY MOUTH TWICE A WEEK    . losartan (COZAAR) 50 MG tablet Take 50 mg by mouth daily.    Marland Kitchen neomycin-polymyxin-hydrocortisone (CORTISPORIN) OTIC solution Twice daily as needed for toe nail pain and inflammation may cover with a Band-Aid after each use 10 mL 0  . omeprazole (PRILOSEC) 20 MG capsule Take 20 mg by mouth daily.      . promethazine (PHENERGAN) 25 MG tablet Take 1 tablet (25 mg total) by mouth every 8  (eight) hours as needed for nausea or vomiting. 30 tablet 2  . simvastatin (ZOCOR) 20 MG tablet     . venlafaxine (EFFEXOR-XR) 37.5 MG 24 hr capsule Take 37.5 mg by mouth daily.      Current Facility-Administered Medications on File Prior to Visit  Medication Dose Route Frequency Provider Last Rate Last Dose  . 0.9 %  sodium chloride infusion  500 mL Intravenous Once Jackquline Denmark, MD      . triamcinolone acetonide (KENALOG) 10 MG/ML injection 10 mg  10 mg Other Once [place], Willisha [place], DPM      . triamcinolone acetonide (KENALOG-40) injection 20 mg  20 mg Other Once Landis Martins, DPM        Allergies  Allergen Reactions  . Codeine Nausea Only  . Prednisone Other (See Comments)    Reaction: Increased heart rate  . Ultram [Tramadol Hcl] Itching  . Tape     Pulls skin off   . Prednisolone Acetate Palpitations    Objective:   General:  Alert and oriented x 3, in no acute distress  Vascular: DP and PT pedal pulses 1 out of 4 bilateral.  Severe varicosities bilateral.  Musculoskeletal there is significant tenderness to palpation to the insertion of the plantar fascia on the right that is pretty  severe.  There is decreased pain at the second metatarsal phalangeal joint on the left foot.  There is significant digital deformity and fat pad atrophy with contracture.  Derm: There are no open lesions.  Nails well polished and manicured.  Neurologic: Gross sensation present via light touch  Assessment and Plan: Problem List Items Addressed This Visit    None    Visit Diagnoses    Plantar fasciitis, right    -  Primary   Relevant Medications   HYDROmorphone (DILAUDID) 2 MG tablet   Heel spur, right       Capsulitis of foot, left       Foot pain, bilateral          -Complete examination performed.  -Previous x-rays reviewed. -Discussed with patient in detail the condition of plantar fasciitis, unresponsive, how this  occurs related to the foot type of the patient and general  treatment options. -No reinjection since injection was given a few weeks ago with no additional relief -Prescribed Dilaudid for very severe pain -Dispensed CAM boot to use daily for the next week.  -Discussed long term care and reocurrence; will closely monitor; if fails to improve will consider other treatment modalities.  -Patient to return to office in 2 weeks for follow up or sooner if problems or questions arise.  Landis Martins, DPM

## 2019-06-14 ENCOUNTER — Other Ambulatory Visit: Payer: Self-pay

## 2019-06-14 ENCOUNTER — Ambulatory Visit (INDEPENDENT_AMBULATORY_CARE_PROVIDER_SITE_OTHER): Payer: PPO | Admitting: Gastroenterology

## 2019-06-14 ENCOUNTER — Telehealth: Payer: Self-pay

## 2019-06-14 ENCOUNTER — Encounter: Payer: Self-pay | Admitting: Gastroenterology

## 2019-06-14 VITALS — BP 118/70 | HR 75 | Temp 97.8°F | Ht 65.0 in | Wt 147.2 lb

## 2019-06-14 DIAGNOSIS — R1011 Right upper quadrant pain: Secondary | ICD-10-CM

## 2019-06-14 DIAGNOSIS — R110 Nausea: Secondary | ICD-10-CM | POA: Diagnosis not present

## 2019-06-14 MED ORDER — PROMETHAZINE HCL 25 MG PO TABS: ORAL_TABLET | ORAL | 2 refills | Status: DC

## 2019-06-14 MED ORDER — PROMETHAZINE HCL 12.5 MG PO TABS: ORAL_TABLET | ORAL | 2 refills | Status: DC

## 2019-06-14 NOTE — Telephone Encounter (Signed)
BUN 19 mg/dL Creatinine 0.65 mg/dL

## 2019-06-14 NOTE — Progress Notes (Addendum)
Chief Complaint: FU  Referring Provider:  Nicoletta Dress, MD      ASSESSMENT AND PLAN;   #1. RUQ abdominal pain with episodic nausea. Pt is s/p cholecystectomy in past and neg ERCP as below.  Has H/O GERD on chronic omeprazole. Neg [place] 02/2018 except for fatty liver.  No biliary ductal dilatation. Neg EGD 02/2018 except for retained food.  -Blood work report from Dr Delena Bali. -CT AP with PO and IV contrast (Cannot do MRCP due to pacemaker). -Continue Phenergan 25 mg p.o. every 6-8 hours as needed, #30, 2 refils -Continue omeprazole 20 mg p.o. QD -Bentyl '10mg'$  po Bid prn #60 -If still with problems, solid phase GES, followed by 24 hrs urine for 5HIAA, and further work-up. -Discussed extensively with the patient and patient's husband. -FU in 12 weeks.    #2. H/O Elevated alkaline phosphatase with imaging study showing dilated right intrahepatic biliary ducts.  S/P ERCP 04/26/2015-normal cholangiogram, sphincterotomy at [facility].  Subsequent LFTs were normal.  Addendum: We just got labs from Dr. Trixie Deis 05/20/2019-normal CMP except for glucose 124.  Hemoglobin A1c 6.2. HPI:    Amanda Bryant is a 78 y.o. female  RUQ pain with nausea over the last 3 to 4 months Typical attack starts as being hot (similar to hot flashes) followed by nausea- then feels very cold without chills or fever. Attacks every 2 weeks, lasting for about 2 to 3 hours and then spontaneously getting better.  No radiation of the pain.  No vomiting.  No diarrhea or jaundice. Had nocturnal symptoms as well. Could not identify any exacerbating factors or any fatty food intolerance.  Denies having any jaundice dark urine or pale stools.  Has been having regular bowel movements ever since she has started magnesium supplements.  No weight loss or loss of appetite.  Negative ultrasound 03/12/2018 except for 2 cm left renal cyst.  Previous GI procedures: -Colonoscopy 07/2011: Small hyperplastic polyp S/P polypectomy,  fair preparation, mild sigmoid diverticulosis. -EGD 03/16/2018; small hiatal hernia, mild gastritis, nonbleeding duodenal diverticulum, significant food in the stomach limiting examination.  No outlet obstruction. bx- neg for HP Past Medical History:  Diagnosis Date  . Arthritis   . Complete heart block (HCC)    intermittent, documented by prev ILR  . HTN (hypertension)   . Pacemaker    Implanted 2008  . PVC's (premature ventricular contractions)   . S/P cardiac catheterization    2001- nonobstructive disease; nl lv function    Past Surgical History:  Procedure Laterality Date  . CHOLECYSTECTOMY    . COLONOSCOPY  07/22/2011   Colonic Polyp, mild sigmoid diverticulosis, small internal hemorrhoids. Bx: hyperplastic polyps.  . EP IMPLANTABLE DEVICE N/A 06/10/2016   Procedure: PPM Generator Changeout;  Surgeon: Will Meredith Leeds, MD;  Location: [facility];  Service: Cardiovascular;  Laterality: N/A;  . ERCP  04/26/2015   Normal cholangiogram- no hilar/righthepatic duct stricture visualized.    Family History  Problem Relation Age of Onset  . Heart disease Mother   . Diabetes Mother   . Diabetes Father   . Heart disease Father   . Colon cancer Neg Hx     Social History   Tobacco Use  . Smoking status: Never Smoker  . Smokeless tobacco: Never Used  Substance Use Topics  . Alcohol use: No    Alcohol/week: 0.0 standard drinks  . Drug use: No    Current Outpatient Medications  Medication Sig Dispense Refill  . acetaminophen (TYLENOL) 500  MG tablet Take 500 mg by mouth every 6 (six) hours as needed for mild pain.    Marland Kitchen alendronate (FOSAMAX) 70 MG tablet Take 70 mg by mouth every 7 (seven) days. Take with a full glass of water on an empty stomach.    Marland Kitchen aspirin EC 81 MG tablet Take 81 mg by mouth daily as needed for mild pain.    . Calcium Carbonate (CALTRATE 600 PO) Take 1 tablet by mouth daily.     . carvedilol (COREG) 6.25 MG tablet Take 6.25 mg by mouth 2 (two)  times daily.    . Cholecalciferol (VITAMIN D) 1000 UNITS capsule Take 1,000 Units by mouth daily.      . fish oil-omega-3 fatty acids 1000 MG capsule Take 2 g by mouth daily.      . fluconazole (DIFLUCAN) 150 MG tablet TAKE 1 TABLET BY MOUTH TWICE A WEEK    . losartan (COZAAR) 50 MG tablet Take 50 mg by mouth daily.    Marland Kitchen neomycin-polymyxin-hydrocortisone (CORTISPORIN) OTIC solution Twice daily as needed for toe nail pain and inflammation may cover with a Band-Aid after each use 10 mL 0  . omeprazole (PRILOSEC) 20 MG capsule Take 20 mg by mouth daily.      . promethazine (PHENERGAN) 25 MG tablet Take 1 tablet (25 mg total) by mouth every 8 (eight) hours as needed for nausea or vomiting. 30 tablet 2  . simvastatin (ZOCOR) 20 MG tablet     . venlafaxine (EFFEXOR-XR) 37.5 MG 24 hr capsule Take 37.5 mg by mouth daily.      Current Facility-Administered Medications  Medication Dose Route Frequency Provider Last Rate Last Dose  . 0.9 %  sodium chloride infusion  500 mL Intravenous Once Jackquline Denmark, MD      . triamcinolone acetonide (KENALOG) 10 MG/ML injection 10 mg  10 mg Other Once [place], [place], DPM      . triamcinolone acetonide (KENALOG-40) injection 20 mg  20 mg Other Once Landis Martins, DPM        Allergies  Allergen Reactions  . Codeine Nausea Only  . Prednisone Other (See Comments)    Reaction: Increased heart rate  . Ultram [Tramadol Hcl] Itching  . Tape     Pulls skin off   . Prednisolone Acetate Palpitations    Review of Systems:  Constitutional: Denies diaphoresis, appetite change and fatigue.  HEENT: Denies photophobia, eye pain, redness, hearing loss, ear pain, congestion, sore throat, rhinorrhea, sneezing, mouth sores, neck pain, neck stiffness and tinnitus.   Respiratory: Denies SOB, DOE, cough, chest tightness,  and wheezing.   Cardiovascular: Denies chest pain, palpitations and leg swelling.  Genitourinary: Denies dysuria, urgency, frequency, hematuria, flank pain  and difficulty urinating.  Musculoskeletal: Denies myalgias, back pain, joint swelling, Has arthralgias and arthritis.  Skin: No rash.  Neurological: Denies dizziness, seizures, syncope, weakness, light-headedness, numbness and headaches.  Hematological: Denies adenopathy. Easy bruising, personal or family bleeding history  Psychiatric/Behavioral: Has anxiety or depression     Physical Exam:    BP 118/70   Pulse 75   Temp 97.8 F (36.6 C)   Ht _0  (1.651 m)   Wt 147 lb 4 oz (66.8 kg)   BMI 24.50 kg/m  Filed Weights   06/14/19 1032  Weight: 147 lb 4 oz (66.8 kg)   Constitutional:  Well-developed, in no acute distress. Psychiatric: Normal mood and affect. Behavior is normal. HEENT: Pupils normal.  Conjunctivae are normal. No scleral icterus. Neck supple.  Cardiovascular: Normal rate, regular rhythm. No edema Pulmonary/chest: Effort normal and breath sounds normal. No wheezing, rales or rhonchi. Abdominal: Soft, nondistended. Nontender. Bowel sounds active throughout. There are no masses palpable. No hepatomegaly. Rectal:  defered Neurological: Alert and oriented to person place and time. Skin: Skin is warm and dry. No rashes noted.  Data Reviewed: I have personally reviewed following labs and imaging studies  CBC: CBC Latest Ref Rng & Units 03/05/2018 06/04/2016 01/14/2009  WBC 4.0 - 10.5 K/uL 7.1 7.4 -  Hemoglobin 12.0 - 15.0 g/dL 13.1 13.2 11.9(L)  Hematocrit 36.0 - 46.0 % 39.7 40.6 34.7(L)  Platelets 150.0 - 400.0 K/uL 184.0 188 -    CMP: CMP Latest Ref Rng & Units 03/05/2018 06/10/2016 06/04/2016  Glucose 70 - 99 mg/dL 104(H) 103(H) 86  BUN 6 - 23 mg/dL _0 Creatinine 0.40 - 1.20 mg/dL 0.58 0.81 0.80  Sodium 135 - 145 mEq/L 140 141 141  Potassium 3.5 - 5.1 mEq/L 5.3(H) 4.2 4.5  Chloride 96 - 112 mEq/L 105 110 104  CO2 19 - 32 mEq/L _1 Calcium 8.4 - 10.5 mg/dL 9.6 9.2 9.3  Total Protein 6.0 - 8.3 g/dL 6.4 - -  Total Bilirubin 0.2 - 1.2 mg/dL 0.4 - -   Alkaline Phos 39 - 117 U/L 75 - -  AST 0 - 37 U/L 35 - -  ALT 0 - 35 U/L 30 - -   Hepatic Function Latest Ref Rng & Units 03/05/2018 12/01/2007  Total Protein 6.0 - 8.3 g/dL 6.4 6.5  Albumin 3.5 - 5.2 g/dL 4.0 3.6  AST 0 - 37 U/L 35 30  ALT 0 - 35 U/L 30 33  Alk Phosphatase 39 - 117 U/L 75 59  Total Bilirubin 0.2 - 1.2 mg/dL 0.4 0.6  25 minutes spent with the patient today. Greater than 50% was spent in counseling and coordination of care with the patient     Carmell Austria, MD 06/14/2019, 10:46 AM  Cc: Nicoletta Dress, MD

## 2019-06-14 NOTE — Patient Instructions (Signed)
If you are age 78 or older, your body mass index should be between 23-30. Your Body mass index is 24.5 kg/m. If this is out of the aforementioned range listed, please consider follow up with your Primary Care Provider.  If you are age 52 or younger, your body mass index should be between 19-25. Your Body mass index is 24.5 kg/m. If this is out of the aformentioned range listed, please consider follow up with your Primary Care Provider.  You will be contacted to schedule your Ct Scan once we receive your blood work from Dr. Delena Bali office. If you have not heard from our office by the end of the week please call back at [phone] and ask to speak to [place].   We have sent the following medications to your pharmacy for you to pick up at your convenience: Phenergan   Follow up in 12 weeks.   Thank you,  Dr. Jackquline Denmark

## 2019-06-15 ENCOUNTER — Telehealth: Payer: Self-pay

## 2019-06-15 NOTE — Telephone Encounter (Signed)
You have [facility] [place] [address] [facility]   You are scheduled on 06/22/19 at [place] should arrive 15 minutes prior to your appointment time for registration. Please follow the written instructions below on the day of your exam:  WARNING: IF YOU ARE ALLERGIC TO IODINE/X-RAY DYE, PLEASE NOTIFY RADIOLOGY IMMEDIATELY AT [phone]! YOU WILL BE GIVEN A 13 HOUR PREMEDICATION PREP.  1) Do not eat or drink anything after 6am (4 hours prior to your test) 2) You have been given 2 bottles of oral contrast to drink. The solution may taste better if refrigerated, but do NOT add ice or any other liquid to this solution. Shake well before drinking.    Drink 1 bottle of contrast @ 8am (2 hours prior to your exam)  Drink 1 bottle of contrast @ 9am (1 hour prior to your exam)  You may take any medications as prescribed with a small amount of water, if necessary. If you take any of the following medications: METFORMIN, GLUCOPHAGE, GLUCOVANCE, AVANDAMET, RIOMET, FORTAMET, [place] MET, JANUMET, GLUMETZA or METAGLIP, you MAY be asked to HOLD this medication 48 hours AFTER the exam.  The purpose of you drinking the oral contrast is to aid in the visualization of your intestinal tract. The contrast solution may cause some diarrhea. Depending on your individual set of symptoms, you may also receive an intravenous injection of x-ray contrast/dye. Plan on being at [facility] for 30 minutes or longer, depending on the type of exam you are having performed.  This test typically takes 30-45 minutes to complete.  If you have any questions regarding your exam or if you need to reschedule, you may call the CT department at [phone] between the hours of 8:00 am and 5:00 pm, Monday-Friday.  ________________________________________________________________________  I have called patient and left a  message to inform patient about appointment and directions.

## 2019-06-15 NOTE — Telephone Encounter (Signed)
Returning your call. Please call back to [phone].

## 2019-06-15 NOTE — Addendum Note (Signed)
Addended by: Karena Addison on: 06/15/2019 09:40 AM   Modules accepted: Orders

## 2019-06-16 NOTE — Telephone Encounter (Signed)
I have spoke to patient and given all instructions. Patient voiced understanding.

## 2019-06-18 ENCOUNTER — Ambulatory Visit (INDEPENDENT_AMBULATORY_CARE_PROVIDER_SITE_OTHER): Payer: PPO | Admitting: Sports Medicine

## 2019-06-18 ENCOUNTER — Encounter: Payer: Self-pay | Admitting: Sports Medicine

## 2019-06-18 ENCOUNTER — Other Ambulatory Visit: Payer: Self-pay

## 2019-06-18 DIAGNOSIS — M722 Plantar fascial fibromatosis: Secondary | ICD-10-CM

## 2019-06-18 DIAGNOSIS — M7731 Calcaneal spur, right foot: Secondary | ICD-10-CM

## 2019-06-18 NOTE — Progress Notes (Signed)
Subjective: Amanda Bryant is a 78 y.o. female returns to office for follow up of right heel pain. Reports that he heel is feeling better. No pain. Boot is making her leg feel heavy. Denies redness, warmth, or any acute symptoms. Patient denies any recent changes in medications or new problems since last visit.   Patient Active Problem List   Diagnosis Date Noted  . Pain in the coccyx 04/21/2018  . Complete heart block ([place]) 06/10/2016  . 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 Pacemaker-[employer]   . HTN (hypertension)   . PVC's (premature ventricular contractions)     Current Outpatient Medications on File Prior to Visit  Medication Sig Dispense Refill  . acetaminophen (TYLENOL) 500 MG tablet Take 500 mg by mouth every 6 (six) hours as needed for mild pain.    Marland Kitchen alendronate (FOSAMAX) 70 MG tablet Take 70 mg by mouth every 7 (seven) days. Take with a full glass of water on an empty stomach.    Marland Kitchen aspirin EC 81 MG tablet Take 81 mg by mouth daily as needed for mild pain.    . Calcium Carbonate (CALTRATE 600 PO) Take 1 tablet by mouth daily.     . carvedilol (COREG) 6.25 MG tablet Take 6.25 mg by mouth 2 (two) times daily.    . Cholecalciferol (VITAMIN D) 1000 UNITS capsule Take 1,000 Units by mouth daily.      . fish oil-omega-3 fatty acids 1000 MG capsule Take 2 g by mouth daily.      . fluconazole (DIFLUCAN) 150 MG tablet TAKE 1 TABLET BY MOUTH TWICE A WEEK    . losartan (COZAAR) 50 MG tablet Take 50 mg by mouth daily.    Marland Kitchen neomycin-polymyxin-hydrocortisone (CORTISPORIN) OTIC solution Twice daily as needed for toe nail pain and inflammation may cover with a Band-Aid after each use 10 mL 0  . omeprazole (PRILOSEC) 20 MG capsule Take 20 mg by mouth daily.      . promethazine (PHENERGAN) 25 MG tablet Take one tablet every 6-8 hours as needed 30 tablet 2  . simvastatin (ZOCOR) 20 MG tablet     . venlafaxine (EFFEXOR-XR) 37.5 MG 24 hr capsule Take 37.5 mg by mouth daily.       Current Facility-Administered Medications on File Prior to Visit  Medication Dose Route Frequency Provider Last Rate Last Dose  . 0.9 %  sodium chloride infusion  500 mL Intravenous Once Jackquline Denmark, MD      . triamcinolone acetonide (KENALOG) 10 MG/ML injection 10 mg  10 mg Other Once [place], Keigen Caddell, DPM      . triamcinolone acetonide (KENALOG-40) injection 20 mg  20 mg Other Once Landis Martins, DPM        Allergies  Allergen Reactions  . Codeine Nausea Only  . Prednisone Other (See Comments)    Reaction: Increased heart rate  . Ultram [Tramadol Hcl] Itching  . Tape     Pulls skin off   . Prednisolone Acetate Palpitations    Objective:   General:  Alert and oriented x 3, in no acute distress  Vascular: DP and PT pedal pulses 1 out of 4 bilateral.  Severe varicosities bilateral.  Musculoskeletal there is significant tenderness to palpation to the insertion of the plantar fascia on the right that is pretty severe.  There is decreased pain at the second metatarsal phalangeal joint on the left foot.  There is significant digital deformity and fat pad atrophy with contracture.  Derm: There are  no open lesions.  Nails well polished and manicured.  Neurologic: Gross sensation present via light touch  Assessment and Plan: Problem List Items Addressed This Visit    None    Visit Diagnoses    Plantar fasciitis, right    -  Primary   Heel spur, right         -Complete examination performed. -Re-discussed with patient in detail the condition of plantar fasciitis now much improved after being in CAM boot -Advised patient that she may now transition to good supportive shoe with heel lift -Continue with rest, ice, elevation -If pain flares return to boot -Patient to return to office as needed sooner if problems or questions arise.  Landis Martins, DPM

## 2019-06-21 ENCOUNTER — Telehealth: Payer: Self-pay | Admitting: Gastroenterology

## 2019-06-21 NOTE — Telephone Encounter (Signed)
Please see previous message  Larena Glassman, do you have lab work from the patient's PCP or other office to show BUN and Creatinine so this patient can have her CT scan on 06/22/2019? If you have them/ please call [facility] radiology and inform them of the results-if not, the patient will need to have lab work done and may need to be rescheduled for her CT. Thank you

## 2019-06-21 NOTE — Telephone Encounter (Signed)
Destiny from [place]. Called inquiring if pt has had labs for Bun and creatine that she needs before her imagining tomorrow. Pls call her.

## 2019-06-22 ENCOUNTER — Ambulatory Visit (HOSPITAL_BASED_OUTPATIENT_CLINIC_OR_DEPARTMENT_OTHER)
Admission: RE | Admit: 2019-06-22 | Discharge: 2019-06-22 | Disposition: A | Discharge status: Home / Self Care | Payer: PPO | Source: Ambulatory Visit | Attending: Gastroenterology | Admitting: Gastroenterology

## 2019-06-22 ENCOUNTER — Other Ambulatory Visit: Payer: Self-pay

## 2019-06-22 DIAGNOSIS — R1011 Right upper quadrant pain: Secondary | ICD-10-CM

## 2019-06-22 DIAGNOSIS — R110 Nausea: Secondary | ICD-10-CM | POA: Insufficient documentation

## 2019-06-22 MED ORDER — IOHEXOL 300 MG/ML  SOLN
100.0000 mL | Freq: Once | INTRAMUSCULAR | Status: AC | PRN
  Administered 2019-06-22: 100 mL via INTRAVENOUS

## 2019-06-24 NOTE — Telephone Encounter (Signed)
Please see additional documentation concerning this patient 

## 2019-06-24 NOTE — Telephone Encounter (Signed)
Pt stated that she is returning your call regarding CT.

## 2019-06-28 DIAGNOSIS — J208 Acute bronchitis due to other specified organisms: Secondary | ICD-10-CM | POA: Diagnosis not present

## 2019-06-28 DIAGNOSIS — B9689 Other specified bacterial agents as the cause of diseases classified elsewhere: Secondary | ICD-10-CM | POA: Diagnosis not present

## 2019-06-30 ENCOUNTER — Telehealth: Payer: Self-pay | Admitting: Gastroenterology

## 2019-06-30 NOTE — Telephone Encounter (Signed)

## 2019-07-01 ENCOUNTER — Encounter: Payer: Self-pay | Admitting: Gastroenterology

## 2019-07-01 ENCOUNTER — Ambulatory Visit (AMBULATORY_SURGERY_CENTER): Payer: PPO | Admitting: Gastroenterology

## 2019-07-01 ENCOUNTER — Other Ambulatory Visit: Payer: Self-pay | Admitting: Gastroenterology

## 2019-07-01 ENCOUNTER — Other Ambulatory Visit: Payer: Self-pay

## 2019-07-01 VITALS — BP 140/73 | HR 68 | Temp 98.2°F | Resp 17 | Ht 65.0 in | Wt 147.0 lb

## 2019-07-01 DIAGNOSIS — E785 Hyperlipidemia, unspecified: Secondary | ICD-10-CM | POA: Diagnosis not present

## 2019-07-01 DIAGNOSIS — R1011 Right upper quadrant pain: Secondary | ICD-10-CM | POA: Diagnosis not present

## 2019-07-01 DIAGNOSIS — K3189 Other diseases of stomach and duodenum: Secondary | ICD-10-CM

## 2019-07-01 DIAGNOSIS — K5713 Diverticulitis of small intestine without perforation or abscess with bleeding: Secondary | ICD-10-CM | POA: Diagnosis not present

## 2019-07-01 DIAGNOSIS — K449 Diaphragmatic hernia without obstruction or gangrene: Secondary | ICD-10-CM

## 2019-07-01 DIAGNOSIS — K2970 Gastritis, unspecified, without bleeding: Secondary | ICD-10-CM | POA: Diagnosis not present

## 2019-07-01 DIAGNOSIS — R110 Nausea: Secondary | ICD-10-CM

## 2019-07-01 DIAGNOSIS — K219 Gastro-esophageal reflux disease without esophagitis: Secondary | ICD-10-CM | POA: Diagnosis not present

## 2019-07-01 DIAGNOSIS — I10 Essential (primary) hypertension: Secondary | ICD-10-CM | POA: Diagnosis not present

## 2019-07-01 DIAGNOSIS — G2581 Restless legs syndrome: Secondary | ICD-10-CM | POA: Diagnosis not present

## 2019-07-01 DIAGNOSIS — R1110 Vomiting, unspecified: Secondary | ICD-10-CM | POA: Diagnosis not present

## 2019-07-01 MED ORDER — SODIUM CHLORIDE 0.9 % IV SOLN: 500.0000 mL | Freq: Once | INTRAVENOUS | Status: DC

## 2019-07-01 NOTE — Patient Instructions (Signed)
Discharge instructions given. Biopsies taken. Resume previous medications. Handouts on Gastritis and hiatal hernia. YOU HAD AN ENDOSCOPIC PROCEDURE TODAY AT [facility]:   Refer to the procedure report that was given to you for any specific questions about what was found during the examination.  If the procedure report does not answer your questions, please call your gastroenterologist to clarify.  If you requested that your care partner not be given the details of your procedure findings, then the procedure report has been included in a sealed envelope for you to review at your convenience later.  YOU SHOULD EXPECT: Some feelings of bloating in the abdomen. Passage of more gas than usual.  Walking can help get rid of the air that was put into your GI tract during the procedure and reduce the bloating. If you had a lower endoscopy (such as a colonoscopy or flexible sigmoidoscopy) you may notice spotting of blood in your stool or on the toilet paper. If you underwent a bowel prep for your procedure, you may not have a normal bowel movement for a few days.  Please Note:  You might notice some irritation and congestion in your nose or some drainage.  This is from the oxygen used during your procedure.  There is no need for concern and it should clear up in a day or so.  SYMPTOMS TO REPORT IMMEDIATELY:    Following upper endoscopy (EGD)  Vomiting of blood or coffee ground material  New chest pain or pain under the shoulder blades  Painful or persistently difficult swallowing  New shortness of breath  Fever of 100F or higher  Black, tarry-looking stools  For urgent or emergent issues, a gastroenterologist can be reached at any hour by calling [phone].   DIET:  We do recommend a small meal at first, but then you may proceed to your regular diet.  Drink plenty of fluids but you should avoid alcoholic beverages for 24 hours.  ACTIVITY:  You should plan to take it easy for  the rest of today and you should NOT DRIVE or use heavy machinery until tomorrow (because of the sedation medicines used during the test).    FOLLOW UP: Our staff will call the number listed on your records 48-72 hours following your procedure to check on you and address any questions or concerns that you may have regarding the information given to you following your procedure. If we do not reach you, we will leave a message.  We will attempt to reach you two times.  During this call, we will ask if you have developed any symptoms of COVID 19. If you develop any symptoms (ie: fever, flu-like symptoms, shortness of breath, cough etc.) before then, please call [phone].  If you test positive for Covid 19 in the 2 weeks post procedure, please call and report this information to [place].    If any biopsies were taken you will be contacted by phone or by letter within the next 1-3 weeks.  Please call us at [phone] if you have not heard about the biopsies in 3 weeks.    SIGNATURES/CONFIDENTIALITY: You and/or your care partner have signed paperwork which will be entered into your electronic medical record.  These signatures attest to the fact that that the information above on your After Visit Summary has been reviewed and is understood.  Full responsibility of the confidentiality of this discharge information lies with you and/or your care-partner.

## 2019-07-01 NOTE — Progress Notes (Signed)
Temp check by JB/Vital check by CW.  Reviewed medical and surgical history with patient.  Necessary updates made to surgical history per patient report.

## 2019-07-01 NOTE — Progress Notes (Signed)
Report to PACU, RN, vss, BBS= Clear.  

## 2019-07-01 NOTE — Progress Notes (Signed)
ented procedure room to  Document pathology after patient had been moved to recovery room. Verified pathology done  with  Dr Darlina Rumpf [facility]

## 2019-07-01 NOTE — Op Note (Signed)
[facility] Patient Name: Amanda Bryant Procedure Date: 07/01/2019 2:57 PM MRN: [id number] Endoscopist: Jackquline Denmark , MD Age: 78 Referring MD:  Date of Birth: 09/11/1941 Gender: Female Account #: [id number] Procedure:                Upper GI endoscopy Indications:              RUQ abdominal pain with episodic nausea. CT showing                            duodenal wall thickening. Previous EGD with                            significant retained. Hence, repeat EGD has been                            performed. Medicines:                Monitored Anesthesia Care Procedure:                Pre-Anesthesia Assessment:                           - Prior to the procedure, a History and Physical                            was performed, and patient medications and                            allergies were reviewed. The patient's tolerance of                            previous anesthesia was also reviewed. The risks                            and benefits of the procedure and the sedation                            options and risks were discussed with the patient.                            All questions were answered, and informed consent                            was obtained. Prior Anticoagulants: The patient has                            taken no previous anticoagulant or antiplatelet                            agents. ASA Grade Assessment: III - A patient with                            severe systemic disease. After reviewing the risks  and benefits, the patient was deemed in                            satisfactory condition to undergo the procedure.                           After obtaining informed consent, the endoscope was                            passed under direct vision. Throughout the                            procedure, the patient's blood pressure, pulse, and                            oxygen saturations were monitored continuously. The                             Endoscope was introduced through the mouth, and                            advanced to the fourth part of duodenum. The upper                            GI endoscopy was accomplished without difficulty.                            The patient tolerated the procedure well. Scope In: Scope Out: Findings:                 The examined esophagus was normal with Z line                            well-defined at 36 cm, examined by NBI.                           A 2 cm hiatal hernia was present.                           Diffuse moderate inflammation characterized by                            erythema was found in the stomach. Biopsies were                            taken with a cold forceps for histology. No outlet                            obstruction.                           A 10 mm non-bleeding diverticulum was found in the                            second portion  of the duodenum. Deep duodenal                            intubation was performed. There were no erosions or                            ulcers. Biopsies were taken with a cold forceps for                            histology from the third portion of the duodenum.                            Estimated blood loss: none. Complications:            No immediate complications. Estimated Blood Loss:     Estimated blood loss: none. Impression:               -2 cm hiatal hernia.                           -Moderate gastritis.                           -Non-bleeding duodenal diverticulum. Recommendation:           - Patient has a contact number available for                            emergencies. The signs and symptoms of potential                            delayed complications were discussed with the                            patient. Return to normal activities tomorrow.                            Written discharge instructions were provided to the                            patient.                            - Resume previous diet.                           - Continue present medications.                           - Await pathology results.                           - Return to GI clinic in 12 weeks. If still with                            problems, will follow work-up                           -  Discussed with Braulio Conte. Jackquline Denmark, MD 07/01/2019 3:27:10 PM This report has been signed electronically.

## 2019-07-05 ENCOUNTER — Telehealth: Payer: Self-pay

## 2019-07-05 NOTE — Telephone Encounter (Signed)
  Follow up Call-  Call back number 07/01/2019 03/16/2018  Post procedure Call Back phone  # [phone]  Permission to leave phone message Yes Yes  Some recent data might be hidden     Patient questions:  Do you have a fever, pain , or abdominal swelling? No. Pain Score  0 *  Have you tolerated food without any problems? Yes.    Have you been able to return to your normal activities? Yes.    Do you have any questions about your discharge instructions: Diet   No. Medications  No. Follow up visit  No.  Do you have questions or concerns about your Care? No.  Actions: * If pain score is 4 or above: No action needed, pain <4.  1. Have you developed a fever since your procedure? no  2.   Have you had an respiratory symptoms (SOB or cough) since your procedure? no  3.   Have you tested positive for COVID 19 since your procedure no  4.   Have you had any family members/close contacts diagnosed with the COVID 19 since your procedure?  no   If yes to any of these questions please route to Joylene John, RN and Alphonsa Gin, Therapist, sports.

## 2019-07-08 ENCOUNTER — Telehealth: Payer: Self-pay | Admitting: Gastroenterology

## 2019-07-08 DIAGNOSIS — R110 Nausea: Secondary | ICD-10-CM

## 2019-07-08 NOTE — Telephone Encounter (Signed)
She wants to know if she should continue the Phenergan for her spells that she has of being Nauseous, really hot and then really cold or if he wanted to put her on something else until her F/U appt in 3 months.

## 2019-07-08 NOTE — Telephone Encounter (Signed)
Please see additional documentation concerning this patient 

## 2019-07-08 NOTE — Telephone Encounter (Signed)
Pt reported that she has been having a nausea attack and has taken Phenergan without relief.  Please advise.

## 2019-07-08 NOTE — Telephone Encounter (Signed)
Pt called back to the office and reported that she has been having a nausea attack and has taken Phenergan without relief.  Please advise.

## 2019-07-13 ENCOUNTER — Encounter: Payer: Self-pay | Admitting: Gastroenterology

## 2019-07-13 NOTE — Telephone Encounter (Signed)
All her labs look great 05/20/2019 I wonder if she is having menopausal symptoms Can she get in touch with Dr. Delena Bali to determine that If still with problems, trial of Zofran (has she ever taken Zofran before?)  4 mg ODT every 6-8 hours as needed, 30  RG

## 2019-07-14 NOTE — Telephone Encounter (Signed)
Attempted to reach patient-phone rang busy-will attempt to reach patient at a later date/time;

## 2019-07-19 MED ORDER — ONDANSETRON 4 MG PO TBDP: 4.0000 mg | ORAL_TABLET | Freq: Four times a day (QID) | ORAL | 0 refills | Status: DC | PRN

## 2019-07-19 NOTE — Telephone Encounter (Signed)
Called and spoke with patient-patient informed of MD recommendations; patient is agreeable with plan of care and verified pharmacy; RX sent to pharmacy; Patient verbalized understanding of information/instructions;  Patient was advised to call the office at [phone] if questions/concerns arise; 

## 2019-07-19 NOTE — Telephone Encounter (Signed)
Called and spoke with patient-patient informed of MD recommendations; patient is agreeable with plan of care and will contact her PCP; advice routed to PCP per patient request; Patient verbalized understanding of information/instructions;  Patient was advised to call the office at [phone] if questions/concerns arise;

## 2019-07-19 NOTE — Telephone Encounter (Signed)
Pt requested a call back.  She stated that she was nauseous and lying down and does not remember recommendation.

## 2019-07-20 NOTE — Progress Notes (Signed)
Electrophysiology Office Note Date: 07/22/2019  ID:  [name], DOB [birth year], MRN [id number]  PCP: Nicoletta Dress, MD Primary Cardiologist: No primary care provider on file. Electrophysiologist: Dr. Caryl Comes  CC: Pacemaker follow-up  Amanda Bryant is a 78 y.o. female seen today for Dr. Caryl Comes. she presents today for routine electrophysiology followup.  Since last being seen in our clinic, the patient reports doing very well.  she denies chest pain, palpitations, dyspnea, PND, orthopnea, vomiting, dizziness, syncope, edema, weight gain, or early satiety.  She has been having problems with nausea and abdominal pain in the setting of a hernia and gastritis that is being treated per GI. Otherwise, she has no concerns today and is tolerating her medications well.   Device History: MDT ILR implanted 2008 for syncope Medtronic Dual Chamber PPM implanted 2008 -> gen change 05/2016 for CHB  Past Medical History:  Diagnosis Date  . Arthritis   . Complete heart block (HCC)    intermittent, documented by prev ILR  . HTN (hypertension)   . Pacemaker    Implanted 2008  . PVC's (premature ventricular contractions)   . S/P cardiac catheterization    2001- nonobstructive disease; nl lv function   Past Surgical History:  Procedure Laterality Date  . BACK SURGERY  2010  . [place]  . [place]  . CHOLECYSTECTOMY    . COLONOSCOPY  07/22/2011   Colonic Polyp, mild sigmoid diverticulosis, small internal hemorrhoids. Bx: hyperplastic polyps.  . EP IMPLANTABLE DEVICE N/A 06/10/2016   Procedure: PPM Generator Changeout;  Surgeon: Will Meredith Leeds, MD;  Location: [facility];  Service: Cardiovascular;  Laterality: N/A;  . ERCP  04/26/2015   Normal cholangiogram- no hilar/righthepatic duct stricture visualized.    Current Outpatient Medications  Medication Sig Dispense Refill  . acetaminophen (TYLENOL) 500 MG tablet Take 500 mg by mouth every 6 (six)  hours as needed for mild pain.    Marland Kitchen alendronate (FOSAMAX) 70 MG tablet Take 70 mg by mouth every 7 (seven) days. Take with a full glass of water on an empty stomach.    Marland Kitchen aspirin EC 81 MG tablet Take 81 mg by mouth daily as needed for mild pain.    . Calcium Carbonate (CALTRATE 600 PO) Take 1 tablet by mouth daily.     . carvedilol (COREG) 6.25 MG tablet Take 6.25 mg by mouth 2 (two) times daily.    . Cholecalciferol (VITAMIN D) 1000 UNITS capsule Take 1,000 Units by mouth daily.      . fish oil-omega-3 fatty acids 1000 MG capsule Take 2 g by mouth daily.      . fluconazole (DIFLUCAN) 150 MG tablet TAKE 1 TABLET BY MOUTH TWICE A WEEK    . losartan (COZAAR) 50 MG tablet Take 50 mg by mouth daily.    Marland Kitchen neomycin-polymyxin-hydrocortisone (CORTISPORIN) OTIC solution Twice daily as needed for toe nail pain and inflammation may cover with a Band-Aid after each use (Patient not taking: Reported on 07/01/2019) 10 mL 0  . omeprazole (PRILOSEC) 20 MG capsule Take 20 mg by mouth daily.      . ondansetron (ZOFRAN ODT) 4 MG disintegrating tablet Take 1 tablet (4 mg total) by mouth every 6 (six) hours as needed for nausea or vomiting. 30 tablet 0  . promethazine (PHENERGAN) 25 MG tablet Take one tablet every 6-8 hours as needed 30 tablet 2  . rOPINIRole (REQUIP) 4 MG tablet Take 4 mg by  mouth at bedtime.    . simvastatin (ZOCOR) 20 MG tablet     . venlafaxine (EFFEXOR-XR) 37.5 MG 24 hr capsule Take 37.5 mg by mouth daily.      Current Facility-Administered Medications  Medication Dose Route Frequency Provider Last Rate Last Dose  . 0.9 %  sodium chloride infusion  500 mL Intravenous Once Jackquline Denmark, MD      . triamcinolone acetonide (KENALOG) 10 MG/ML injection 10 mg  10 mg Other Once [place], [place], DPM      . triamcinolone acetonide (KENALOG-40) injection 20 mg  20 mg Other Once Landis Martins, DPM        Allergies:   Codeine, Prednisone, Ultram [tramadol hcl], Tape, and Prednisolone acetate    Social History: Social History   Socioeconomic History  . Marital status: Married    Spouse name: Not on file  . Number of children: Not on file  . Years of education: Not on file  . Highest education level: Not on file  Occupational History  . Not on file  Social Needs  . Financial resource strain: Not on file  . Food insecurity    Worry: Not on file    Inability: Not on file  . Transportation needs    Medical: Not on file    Non-medical: Not on file  Tobacco Use  . Smoking status: Never Smoker  . Smokeless tobacco: Never Used  Substance and Sexual Activity  . Alcohol use: No    Alcohol/week: 0.0 standard drinks  . Drug use: No  . Sexual activity: Not on file  Lifestyle  . Physical activity    Days per week: Not on file    Minutes per session: Not on file  . Stress: Not on file  Relationships  . Social Herbalist on phone: Not on file    Gets together: Not on file    Attends religious service: Not on file    Active member of club or organization: Not on file    Attends meetings of clubs or organizations: Not on file    Relationship status: Not on file  . Intimate partner violence    Fear of current or ex partner: Not on file    Emotionally abused: Not on file    Physically abused: Not on file    Forced sexual activity: Not on file  Other Topics Concern  . Not on file  Social History Narrative  . Not on file    Family History: Family History  Problem Relation Age of Onset  . Heart disease Mother   . Diabetes Mother   . Diabetes Father   . Heart disease Father   . Colon cancer Neg Hx   . Esophageal cancer Neg Hx   . Rectal cancer Neg Hx   . Stomach cancer Neg Hx      Review of Systems: All other systems reviewed and are otherwise negative except as noted above.  Physical Exam: Vitals:   07/22/19 0935  BP: 116/68  Pulse: 66  Weight: 144 lb (65.3 kg)  Height: 5\' 5"  (1.651 m)     GEN- The patient is well appearing, alert and oriented  x 3 today.   HEENT: normocephalic, atraumatic; sclera clear, conjunctiva pink; hearing intact; oropharynx clear; neck supple  Lungs- Clear to ausculation bilaterally, normal work of breathing.  No wheezes, rales, rhonchi Heart- Regular rate and rhythm, no murmurs, rubs or gallops  GI- soft, non-tender, non-distended, bowel sounds present  Extremities- no clubbing, cyanosis, or edema  MS- no significant deformity or atrophy Skin- warm and dry, no rash or lesion; PPM pocket well healed Psych- euthymic mood, full affect Neuro- strength and sensation are intact  PPM Interrogation- reviewed in detail today,  See PACEART report  EKG:  EKG is ordered today. The ekg ordered today shows A sensed V paced rhythm at 66 bpm  Recent Labs: No results found for requested labs within last 8760 hours.   Wt Readings from Last 3 Encounters:  07/22/19 144 lb (65.3 kg)  07/01/19 147 lb (66.7 kg)  06/14/19 147 lb 4 oz (66.8 kg)     Other studies Reviewed: Additional studies/ records that were reviewed today include: Echo 04/2016 shows LVEF 50-55%, Previous EP office notes, Previous remote checks, Most recent labwork from 05/20/19 reviewed and stable.   Assessment and Plan:  1.  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 HTN Continue current regimen  Current medicines are reviewed at length with the patient today.   The patient does not have concerns regarding her medicines.  The following changes were made today:  none  Disposition:   Follow up with Dr. Caryl Comes in 12 Months. Continue remotes q 3 months.   Amanda Lefevre, PA-C  07/22/2019 10:17 AM  [facility] [address] [place] [address] [phone] (office) [phone] (fax)

## 2019-07-22 ENCOUNTER — Ambulatory Visit: Payer: PPO | Admitting: Student

## 2019-07-22 ENCOUNTER — Other Ambulatory Visit: Payer: Self-pay

## 2019-07-22 VITALS — BP 116/68 | HR 66 | Ht 65.0 in | Wt 144.0 lb

## 2019-07-22 DIAGNOSIS — Z950 Presence of cardiac pacemaker: Secondary | ICD-10-CM | POA: Diagnosis not present

## 2019-07-22 DIAGNOSIS — I442 Atrioventricular block, complete: Secondary | ICD-10-CM | POA: Diagnosis not present

## 2019-07-22 DIAGNOSIS — I10 Essential (primary) hypertension: Secondary | ICD-10-CM

## 2019-07-22 LAB — CUP PACEART INCLINIC DEVICE CHECK
Battery Remaining Longevity: 79 mo
Battery Voltage: 3.01 V
Brady Statistic AP VP Percent: 0.23 %
Brady Statistic AP VS Percent: 0 %
Brady Statistic AS VP Percent: 99.68 %
Brady Statistic AS VS Percent: 0.09 %
Brady Statistic RA Percent Paced: 0.23 %
Brady Statistic RV Percent Paced: 99.85 %
Date Time Interrogation Session: 20201022142234
Implantable Lead Implant Date: 20081014
Implantable Lead Implant Date: 20081014
Implantable Lead Location: 753859
Implantable Lead Location: 753860
Implantable Lead Model: 5076
Implantable Lead Model: 5076
Implantable Pulse Generator Implant Date: 20170911
Lead Channel Impedance Value: 456 Ohm
Lead Channel Impedance Value: 475 Ohm
Lead Channel Impedance Value: 513 Ohm
Lead Channel Impedance Value: 608 Ohm
Lead Channel Pacing Threshold Amplitude: 0.5 V
Lead Channel Pacing Threshold Amplitude: 0.75 V
Lead Channel Pacing Threshold Pulse Width: 0.4 ms
Lead Channel Pacing Threshold Pulse Width: 0.4 ms
Lead Channel Sensing Intrinsic Amplitude: 1 mV
Lead Channel Sensing Intrinsic Amplitude: 1.375 mV
Lead Channel Sensing Intrinsic Amplitude: 13.25 mV
Lead Channel Sensing Intrinsic Amplitude: 13.25 mV
Lead Channel Setting Pacing Amplitude: 2 V
Lead Channel Setting Pacing Amplitude: 2.5 V
Lead Channel Setting Pacing Pulse Width: 0.4 ms
Lead Channel Setting Sensing Sensitivity: 5.6 mV

## 2019-07-26 DIAGNOSIS — Z23 Encounter for immunization: Secondary | ICD-10-CM | POA: Diagnosis not present

## 2019-07-27 ENCOUNTER — Encounter: Payer: PPO | Admitting: *Deleted

## 2019-07-29 ENCOUNTER — Encounter: Payer: Self-pay | Admitting: Gastroenterology

## 2019-07-29 ENCOUNTER — Ambulatory Visit: Payer: PPO | Admitting: Gastroenterology

## 2019-07-29 ENCOUNTER — Other Ambulatory Visit: Payer: Self-pay

## 2019-07-29 VITALS — BP 132/86 | HR 67 | Temp 98.5°F | Ht 65.0 in | Wt 144.4 lb

## 2019-07-29 DIAGNOSIS — R1011 Right upper quadrant pain: Secondary | ICD-10-CM

## 2019-07-29 NOTE — Progress Notes (Signed)
Chief Complaint: Abdominal pain  Referring Provider:  Nicoletta Dress, MD      ASSESSMENT AND PLAN;   #1. RUQ abdominal pain (positive carnett sign) with episodic nausea. Pt is s/p cholecystectomy in the past and neg ERCP as below.  H/O GERD on chronic omeprazole. Neg EGD. Neg CT(Cannot do MRCP due to pacemaker)  Plan: - Biofreeze cream every morning and heating pads at Night.  If still with problems, consider trial of Ultram. - Solid phase GES to r/o gastroparesis. - Brings in insurance document-regarding " meds given during CT".  I have given it to Gervais and asked her to kindly call the insurance company to clarify. - Continue Phenergan on as needed basis. - Zofran 4 mg ODT every 6-8 hours as needed, 30 - FU thereafter - If still with problems and the above WU is negative, will give her a trial of Bentyl. - Discussed extensively with the patient and patient's husband.   #2. H/O Elevated alkaline phosphatase (resolved - normal alk phos at 63 05/2019). H/O cholecystectomy.  S/P ERCP 04/26/2015-Nl cholangiogram, sphincterotomy at [facility]   HPI:    Amanda Bryant is a 78 y.o. female  For follow-up visit Accompanied by her husband. Had negative CT, EGD with biopsies as above. Still with RUQ pain with nausea.  The nausea gets better with Phenergan/Zofran.  Still having "attacks every 2 weeks, lasting for about 2 to 3 hours and then spontaneously getting better".  No radiation of the pain.  No vomiting.  No jaundice dark urine or pale stools.  Could not identify any exacerbating factors or any fatty food intolerance.  Has been having regular bowel movements ever since she has started magnesium supplements.  No weight loss or loss of appetite.  Past GI procedures: -Colonoscopy 07/2011 6 mm colon polyp s/p polypectomy, mild sigmoid diverticulosis, int Hoids Bx-hyperplastic. -EGD 07/01/2019 2 cm hiatal hernia, moderate gastritis, duodenal diverticulum. Bx - neg for celiac/HP.   Negative small bowel biopsies. -CT AP with contrast 06/2019: No acute abnormalities. ?thickening of the duodenum/jejunum, cholecystectomy with pneumobilia (prev ERCP) Past Medical History:  Diagnosis Date  . Arthritis   . Complete heart block (HCC)    intermittent, documented by prev ILR  . HTN (hypertension)   . Pacemaker    Implanted 2008  . PVC's (premature ventricular contractions)   . S/P cardiac catheterization    2001- nonobstructive disease; nl lv function  . Shingles     Past Surgical History:  Procedure Laterality Date  . BACK SURGERY  2010  . [place]  . [place]  . CHOLECYSTECTOMY    . COLONOSCOPY  07/22/2011   Colonic Polyp, mild sigmoid diverticulosis, small internal hemorrhoids. Bx: hyperplastic polyps.  . EP IMPLANTABLE DEVICE N/A 06/10/2016   Procedure: PPM Generator Changeout;  Surgeon: Will Meredith Leeds, MD;  Location: [facility];  Service: Cardiovascular;  Laterality: N/A;  . ERCP  04/26/2015   Normal cholangiogram- no hilar/righthepatic duct stricture visualized.    Family History  Problem Relation Age of Onset  . Heart disease Mother   . Diabetes Mother   . Diabetes Father   . Heart disease Father   . Colon cancer Neg Hx   . Esophageal cancer Neg Hx   . Rectal cancer Neg Hx   . Stomach cancer Neg Hx     Social History   Tobacco Use  . Smoking status: Never Smoker  . Smokeless tobacco: Never Used  Substance Use Topics  . Alcohol use: No    Alcohol/week: 0.0 standard drinks  . Drug use: No    Current Outpatient Medications  Medication Sig Dispense Refill  . acetaminophen (TYLENOL) 500 MG tablet Take 500 mg by mouth every 6 (six) hours as needed for mild pain.    Marland Kitchen alendronate (FOSAMAX) 70 MG tablet Take 70 mg by mouth every 7 (seven) days. Take with a full glass of water on an empty stomach.    Marland Kitchen aspirin EC 81 MG tablet Take 81 mg by mouth daily as needed for mild pain.    . Calcium Carbonate (CALTRATE  600 PO) Take 1 tablet by mouth daily.     . carvedilol (COREG) 6.25 MG tablet Take 6.25 mg by mouth 2 (two) times daily.    . Cholecalciferol (VITAMIN D) 1000 UNITS capsule Take 1,000 Units by mouth daily.      . fish oil-omega-3 fatty acids 1000 MG capsule Take 2 g by mouth daily.      Marland Kitchen losartan (COZAAR) 50 MG tablet Take 50 mg by mouth daily.    Marland Kitchen omeprazole (PRILOSEC) 20 MG capsule Take 20 mg by mouth daily.      . ondansetron (ZOFRAN ODT) 4 MG disintegrating tablet Take 1 tablet (4 mg total) by mouth every 6 (six) hours as needed for nausea or vomiting. 30 tablet 0  . promethazine (PHENERGAN) 25 MG tablet Take one tablet every 6-8 hours as needed 30 tablet 2  . rOPINIRole (REQUIP) 4 MG tablet Take 4 mg by mouth at bedtime.    . simvastatin (ZOCOR) 20 MG tablet     . venlafaxine (EFFEXOR-XR) 37.5 MG 24 hr capsule Take 37.5 mg by mouth daily.      Current Facility-Administered Medications  Medication Dose Route Frequency Provider Last Rate Last Dose  . 0.9 %  sodium chloride infusion  500 mL Intravenous Once Jackquline Denmark, MD      . triamcinolone acetonide (KENALOG) 10 MG/ML injection 10 mg  10 mg Other Once [place], Titorya, DPM      . triamcinolone acetonide (KENALOG-40) injection 20 mg  20 mg Other Once Landis Martins, DPM        Allergies  Allergen Reactions  . Codeine Nausea Only  . Prednisone Other (See Comments)    Reaction: Increased heart rate  . Ultram [Tramadol Hcl] Itching  . Tape     Pulls skin off   . Prednisolone Acetate Palpitations    Review of Systems:  neg     Physical Exam:    BP 132/86   Pulse 67   Temp 98.5 F (36.9 C)   Ht '5\' 5"'$  (1.651 m)   Wt 144 lb 6 oz (65.5 kg)   BMI 24.03 kg/m  Filed Weights   07/29/19 1530  Weight: 144 lb 6 oz (65.5 kg)   Constitutional:  Well-developed, in no acute distress. Psychiatric: Normal mood and affect. Behavior is normal. HEENT: Pupils normal.  Conjunctivae are normal. No scleral icterus. Neck supple.   Cardiovascular: Normal rate, regular rhythm. No edema Pulmonary/chest: Effort normal and breath sounds normal. No wheezing, rales or rhonchi. Abdominal: Soft, nondistended.  Positive Carnett's sign RUQ. Bowel sounds active throughout. There are no masses palpable. No hepatomegaly. Rectal:  defered Neurological: Alert and oriented to person place and time. Skin: Skin is warm and dry. No rashes noted.  Data Reviewed: I have personally reviewed following labs and imaging studies  CBC: CBC Latest Ref Rng & Units 03/05/2018  06/04/2016 01/14/2009  WBC 4.0 - 10.5 K/uL 7.1 7.4 -  Hemoglobin 12.0 - 15.0 g/dL 13.1 13.2 11.9(L)  Hematocrit 36.0 - 46.0 % 39.7 40.6 34.7(L)  Platelets 150.0 - 400.0 K/uL 184.0 188 -    CMP: CMP Latest Ref Rng & Units 03/05/2018 06/10/2016 06/04/2016  Glucose 70 - 99 mg/dL 104(H) 103(H) 86  BUN 6 - 23 mg/dL _0 Creatinine 0.40 - 1.20 mg/dL 0.58 0.81 0.80  Sodium 135 - 145 mEq/L 140 141 141  Potassium 3.5 - 5.1 mEq/L 5.3(H) 4.2 4.5  Chloride 96 - 112 mEq/L 105 110 104  CO2 19 - 32 mEq/L _1 Calcium 8.4 - 10.5 mg/dL 9.6 9.2 9.3  Total Protein 6.0 - 8.3 g/dL 6.4 - -  Total Bilirubin 0.2 - 1.2 mg/dL 0.4 - -  Alkaline Phos 39 - 117 U/L 75 - -  AST 0 - 37 U/L 35 - -  ALT 0 - 35 U/L 30 - -   Hepatic Function Latest Ref Rng & Units 03/05/2018 12/01/2007  Total Protein 6.0 - 8.3 g/dL 6.4 6.5  Albumin 3.5 - 5.2 g/dL 4.0 3.6  AST 0 - 37 U/L 35 30  ALT 0 - 35 U/L 30 33  Alk Phosphatase 39 - 117 U/L 75 59  Total Bilirubin 0.2 - 1.2 mg/dL 0.4 0.6  25 minutes spent with the patient today. Greater than 50% was spent in counseling and coordination of care with the patient     Carmell Austria, MD 07/29/2019, 3:44 PM  Cc: Nicoletta Dress, MD

## 2019-07-29 NOTE — Patient Instructions (Signed)
If you are age 78 or older, your body mass index should be between 23-30. Your Body mass index is 24.03 kg/m. If this is out of the aforementioned range listed, please consider follow up with your Primary Care Provider.  If you are age 36 or younger, your body mass index should be between 19-25. Your Body mass index is 24.03 kg/m. If this is out of the aformentioned range listed, please consider follow up with your Primary Care Provider.   You have been scheduled for a gastric emptying scan at [facility] on 08/16/19 at 9:30am. Please arrive at least 15 minutes prior to your appointment for registration. Please make certain not to have anything to eat or drink after midnight the night before your test. Hold all stomach medications (ex: Zofran, phenergan, Reglan) 48 hours prior to your test. If you need to reschedule your appointment, please contact radiology scheduling at [phone]. _____________________________________________________________________ A gastric-emptying study measures how long it takes for food to move through your stomach. There are several ways to measure stomach emptying. In the most common test, you eat food that contains a small amount of radioactive material. A scanner that detects the movement of the radioactive material is placed over your abdomen to monitor the rate at which food leaves your stomach. This test normally takes about 4 hours to complete. _____________________________________________________________________  Use biofreeze in the morning and a heating pad at bedtime.  Thank you,  Dr. Jackquline Denmark

## 2019-07-30 ENCOUNTER — Telehealth: Payer: Self-pay | Admitting: Gastroenterology

## 2019-07-30 NOTE — Telephone Encounter (Signed)
You're welcome! My pleasure.  Glad we got it taken care of for her.

## 2019-07-30 NOTE — Telephone Encounter (Signed)
I received a copy of Yina's "Notice of denial of payment' today.  I called [employer] to find out what this was actually for.  Per Ashely this is for the contrast for the CT that she had.  It was billed separately than the CT was billed.  She said that Medicare requires them to notify the patient, but she is not responsible for it and it is covered under her Co-pay of $200 and just to toss it out.  Ref# [id number]  I called Alegandra and informed her and she thanked me for my time in the investigation.

## 2019-07-30 NOTE — Telephone Encounter (Signed)
Thanks a lot for taking care of this. Amanda Bryant was quite stressed out about that.  RG

## 2019-08-05 DIAGNOSIS — L821 Other seborrheic keratosis: Secondary | ICD-10-CM | POA: Diagnosis not present

## 2019-08-05 DIAGNOSIS — B351 Tinea unguium: Secondary | ICD-10-CM | POA: Diagnosis not present

## 2019-08-05 DIAGNOSIS — L820 Inflamed seborrheic keratosis: Secondary | ICD-10-CM | POA: Diagnosis not present

## 2019-08-05 DIAGNOSIS — L570 Actinic keratosis: Secondary | ICD-10-CM | POA: Diagnosis not present

## 2019-08-05 DIAGNOSIS — L301 Dyshidrosis [pompholyx]: Secondary | ICD-10-CM | POA: Diagnosis not present

## 2019-08-16 ENCOUNTER — Other Ambulatory Visit: Payer: Self-pay

## 2019-08-16 ENCOUNTER — Ambulatory Visit (HOSPITAL_COMMUNITY)
Admission: RE | Admit: 2019-08-16 | Discharge: 2019-08-16 | Disposition: A | Discharge status: Home / Self Care | Payer: PPO | Source: Ambulatory Visit | Attending: Gastroenterology | Admitting: Gastroenterology

## 2019-08-16 DIAGNOSIS — R109 Unspecified abdominal pain: Secondary | ICD-10-CM | POA: Diagnosis not present

## 2019-08-16 DIAGNOSIS — R1011 Right upper quadrant pain: Secondary | ICD-10-CM | POA: Diagnosis not present

## 2019-08-16 MED ORDER — TECHNETIUM TC 99M SULFUR COLLOID
2.1100 | Freq: Once | INTRAVENOUS | Status: AC | PRN
  Administered 2019-08-16: 2.11 via ORAL

## 2019-08-17 DIAGNOSIS — C44722 Squamous cell carcinoma of skin of right lower limb, including hip: Secondary | ICD-10-CM | POA: Diagnosis not present

## 2019-08-19 ENCOUNTER — Telehealth: Payer: Self-pay | Admitting: Gastroenterology

## 2019-08-19 NOTE — Telephone Encounter (Signed)
Not so sure exactly what happened.  She could have had a reaction to the medication. It resolved with Phenergan. I do not think that blood tests/any other investigations would determine what kind of reaction (or to what)she had. Lets manage it conservatively for now. We would like to know if it happens again. RG

## 2019-08-19 NOTE — Telephone Encounter (Signed)
Called and spoke with patient-patient informed of MD recommendations; patient is agreeable with plan of care and reports she will follow up with her cardiologist as well due to her "heart feeling like it is racing"; Patient verbalized understanding of information/instructions;  Patient was advised to call the office at [phone] if questions/concerns arise;

## 2019-08-19 NOTE — Telephone Encounter (Signed)
Please see notation on results for GES and advise as patient has called back to the office for recommendations

## 2019-08-25 ENCOUNTER — Telehealth: Payer: Self-pay | Admitting: Internal Medicine

## 2019-08-25 NOTE — Telephone Encounter (Signed)
I spoke with pt. She has had several recent GI tests but reports everything has turned out OK.  She reports she was told by Dr Steve Rattler office she should schedule appointment with cardiology. She is not sure why. She reports she is feeling fine. Denies any feelings of rapid heart beat or other cardiac issues.   I asked her to contact Dr Steve Rattler office for clarification regarding reason for cardiology appointment and to call us back if appointment needed.

## 2019-08-25 NOTE — Telephone Encounter (Signed)
New message    Patient calling to report nausea and chills. This has been happening on and off for 1 year. Patient states Dr Amanda Bryant wants her to schedule appointment with Cardiology. Patient last seen 10/22. No other symptoms  Please advise

## 2019-08-30 NOTE — Telephone Encounter (Signed)
Pt is calling back and wants to know why she needs to follow up with her cardiologist when she is not having any issues

## 2019-08-30 NOTE — Telephone Encounter (Signed)
Called and spoke with patient-patient informed of MD recommendation from previous notes-patient reports she will reach out to PCP for advisement-"although I know this is related to my stomach"-patient offered appt and declined to make an appt at this time with Dr. Lyndel Safe; Patient advised to call back to the office at [phone] should questions/concerns arise or if she wishes to schedule an appt; Patient verbalized understanding of information/instructions;

## 2019-09-02 DIAGNOSIS — C44722 Squamous cell carcinoma of skin of right lower limb, including hip: Secondary | ICD-10-CM | POA: Diagnosis not present

## 2019-09-02 DIAGNOSIS — L570 Actinic keratosis: Secondary | ICD-10-CM | POA: Diagnosis not present

## 2019-09-13 ENCOUNTER — Encounter: Payer: Self-pay | Admitting: Gastroenterology

## 2019-09-13 ENCOUNTER — Other Ambulatory Visit: Payer: Self-pay

## 2019-09-13 ENCOUNTER — Telehealth (INDEPENDENT_AMBULATORY_CARE_PROVIDER_SITE_OTHER): Payer: PPO | Admitting: Gastroenterology

## 2019-09-13 VITALS — Ht 65.0 in | Wt 144.0 lb

## 2019-09-13 DIAGNOSIS — R1011 Right upper quadrant pain: Secondary | ICD-10-CM | POA: Diagnosis not present

## 2019-09-13 NOTE — Progress Notes (Signed)
Chief Complaint: Abdominal pain  Referring Provider:  Nicoletta Dress, MD      ASSESSMENT AND PLAN;   #1. RUQ abdominal pain (positive carnett sign) with episodic nausea. Pt is s/p chole in past and neg ERCP as below.  H/O GERD on chronic omeprazole. Neg EGD 07/2019 for etiology. Neg CT 06/2019(Cannot do MRCP due to pacemaker). Neg GES 08/2019  Plan: - Continue Phenergan on as needed basis. - FU in 6 months. - If still with problems, check CBC, CMP and lipase at the time of "attack", also will give her a trial of Bentyl.  She will get in touch with Dr. Delena Bali if she has any problems as well. -D/W pt.   #2. H/O Elevated alkaline phosphatase (resolved - normal alk phos at 63 05/2019). H/O cholecystectomy.  S/P ERCP 04/26/2015-Nl cholangiogram, sphincterotomy at [facility].  #3. GERD with small HH  - continue omeprazole 20 mg p.o. once a day   HPI:    Amanda Bryant is a 78 y.o. female  For follow-up visit Televisit Had normal solid-phase gastric emptying scan  Feels much better.  No further abdominal pain  But would have occasional "attacks".  Has not had a bad attack.  Phenergan works well for nausea.  No vomiting.  No jaundice dark urine or pale stools.  Could not identify any exacerbating factors or any fatty food intolerance.  Has been having regular bowel movements ever since she has started magnesium supplements.  No weight loss or loss of appetite.  Past GI procedures: -Colonoscopy 07/2011 6 mm colon polyp s/p polypectomy, mild sigmoid diverticulosis, int Hoids Bx-hyperplastic. -EGD 07/01/2019 2 cm hiatal hernia, moderate gastritis, duodenal diverticulum. Bx - neg for celiac/HP.  Negative small bowel biopsies. -CT AP with contrast 06/2019: No acute abnormalities. ?thickening of the duodenum/jejunum, cholecystectomy with pneumobilia (prev ERCP) -GES 08/2019- Nl Past Medical History:  Diagnosis Date  . Arthritis   . Complete heart block (HCC)    intermittent,  documented by prev ILR  . HTN (hypertension)   . Pacemaker    Implanted 2008  . PVC's (premature ventricular contractions)   . S/P cardiac catheterization    2001- nonobstructive disease; nl lv function  . Shingles     Past Surgical History:  Procedure Laterality Date  . BACK SURGERY  2010  . [place]  . [place]  . CHOLECYSTECTOMY    . COLONOSCOPY  07/22/2011   Colonic Polyp, mild sigmoid diverticulosis, small internal hemorrhoids. Bx: hyperplastic polyps.  . EP IMPLANTABLE DEVICE N/A 06/10/2016   Procedure: PPM Generator Changeout;  Surgeon: Will Meredith Leeds, MD;  Location: [facility];  Service: Cardiovascular;  Laterality: N/A;  . ERCP  04/26/2015   Normal cholangiogram- no hilar/righthepatic duct stricture visualized.    Family History  Problem Relation Age of Onset  . Heart disease Mother   . Diabetes Mother   . Diabetes Father   . Heart disease Father   . Colon cancer Neg Hx   . Esophageal cancer Neg Hx   . Rectal cancer Neg Hx   . Stomach cancer Neg Hx     Social History   Tobacco Use  . Smoking status: Never Smoker  . Smokeless tobacco: Never Used  Substance Use Topics  . Alcohol use: No    Alcohol/week: 0.0 standard drinks  . Drug use: No    Current Outpatient Medications  Medication Sig Dispense Refill  . acetaminophen (TYLENOL) 500 MG tablet Take  500 mg by mouth every 6 (six) hours as needed for mild pain.    Marland Kitchen alendronate (FOSAMAX) 70 MG tablet Take 70 mg by mouth every 7 (seven) days. Take with a full glass of water on an empty stomach.    Marland Kitchen aspirin EC 81 MG tablet Take 81 mg by mouth daily as needed for mild pain.    . Calcium Carbonate (CALTRATE 600 PO) Take 1 tablet by mouth daily.     . carvedilol (COREG) 6.25 MG tablet Take 6.25 mg by mouth 2 (two) times daily.    . Cholecalciferol (VITAMIN D) 1000 UNITS capsule Take 1,000 Units by mouth daily.      . fish oil-omega-3 fatty acids 1000 MG capsule Take 2 g by  mouth daily.      Marland Kitchen losartan (COZAAR) 50 MG tablet Take 50 mg by mouth daily.    Marland Kitchen omeprazole (PRILOSEC) 20 MG capsule Take 20 mg by mouth daily.      Marland Kitchen rOPINIRole (REQUIP) 4 MG tablet Take 4 mg by mouth at bedtime.    . simvastatin (ZOCOR) 20 MG tablet Take 20 mg by mouth daily.     Marland Kitchen venlafaxine (EFFEXOR-XR) 37.5 MG 24 hr capsule Take 37.5 mg by mouth daily.     . ondansetron (ZOFRAN ODT) 4 MG disintegrating tablet Take 1 tablet (4 mg total) by mouth every 6 (six) hours as needed for nausea or vomiting. (Patient not taking: Reported on 09/13/2019) 30 tablet 0  . promethazine (PHENERGAN) 25 MG tablet Take one tablet every 6-8 hours as needed (Patient not taking: Reported on 09/13/2019) 30 tablet 2   Current Facility-Administered Medications  Medication Dose Route Frequency Provider Last Rate Last Admin  . 0.9 %  sodium chloride infusion  500 mL Intravenous Once Jackquline Denmark, MD      . triamcinolone acetonide (KENALOG) 10 MG/ML injection 10 mg  10 mg Other Once [place], [place], DPM      . triamcinolone acetonide (KENALOG-40) injection 20 mg  20 mg Other Once Landis Martins, DPM        Allergies  Allergen Reactions  . Codeine Nausea Only  . Prednisone Other (See Comments)    Reaction: Increased heart rate  . Ultram [Tramadol Hcl] Itching  . Tape     Pulls skin off   . Prednisolone Acetate Palpitations    Review of Systems:  neg     Physical Exam:    Ht _0  (1.651 m)   Wt 144 lb (65.3 kg)   BMI 23.96 kg/m  Filed Weights   09/13/19 0803  Weight: 144 lb (65.3 kg)   Not examined since it was a televisit.  Data Reviewed: I have personally reviewed following labs and imaging studies  CBC: CBC Latest Ref Rng & Units 03/05/2018 06/04/2016 01/14/2009  WBC 4.0 - 10.5 K/uL 7.1 7.4 -  Hemoglobin 12.0 - 15.0 g/dL 13.1 13.2 11.9(L)  Hematocrit 36.0 - 46.0 % 39.7 40.6 34.7(L)  Platelets 150.0 - 400.0 K/uL 184.0 188 -    CMP: CMP Latest Ref Rng & Units 03/05/2018 06/10/2016 06/04/2016   Glucose 70 - 99 mg/dL 104(H) 103(H) 86  BUN 6 - 23 mg/dL _1 Creatinine 0.40 - 1.20 mg/dL 0.58 0.81 0.80  Sodium 135 - 145 mEq/L 140 141 141  Potassium 3.5 - 5.1 mEq/L 5.3(H) 4.2 4.5  Chloride 96 - 112 mEq/L 105 110 104  CO2 19 - 32 mEq/L _2 Calcium 8.4 - 10.5 mg/dL  9.6 9.2 9.3  Total Protein 6.0 - 8.3 g/dL 6.4 - -  Total Bilirubin 0.2 - 1.2 mg/dL 0.4 - -  Alkaline Phos 39 - 117 U/L 75 - -  AST 0 - 37 U/L 35 - -  ALT 0 - 35 U/L 30 - -   Hepatic Function Latest Ref Rng & Units 03/05/2018 12/01/2007  Total Protein 6.0 - 8.3 g/dL 6.4 6.5  Albumin 3.5 - 5.2 g/dL 4.0 3.6  AST 0 - 37 U/L 35 30  ALT 0 - 35 U/L 30 33  Alk Phosphatase 39 - 117 U/L 75 59  Total Bilirubin 0.2 - 1.2 mg/dL 0.4 0.6  15 minutes spent with the patient today. Greater than 50% was spent in counseling and coordination of care with the patient I connected with  Amanda Bryant on 09/13/19 by a telephone (video would not work) and verified that I am speaking with the correct person using two identifiers.   I discussed the limitations of evaluation and management by telemedicine. The patient expressed understanding and agreed to proceed.       Carmell Austria, MD 09/13/2019, 8:52 AM  Cc: Nicoletta Dress, MD

## 2019-09-13 NOTE — Patient Instructions (Signed)
If you are age 78 or older, your body mass index should be between 23-30. Your Body mass index is 23.96 kg/m. If this is out of the aforementioned range listed, please consider follow up with your Primary Care Provider.  If you are age 57 or younger, your body mass index should be between 19-25. Your Body mass index is 23.96 kg/m. If this is out of the aformentioned range listed, please consider follow up with your Primary Care Provider.   Please continue Phenergan as needed.  Thank you,  Dr. Jackquline Denmark

## 2019-10-13 ENCOUNTER — Ambulatory Visit: Payer: PPO | Admitting: Sports Medicine

## 2019-10-25 DIAGNOSIS — J069 Acute upper respiratory infection, unspecified: Secondary | ICD-10-CM | POA: Diagnosis not present

## 2019-10-26 ENCOUNTER — Ambulatory Visit (INDEPENDENT_AMBULATORY_CARE_PROVIDER_SITE_OTHER): Payer: PPO | Admitting: *Deleted

## 2019-10-26 DIAGNOSIS — I442 Atrioventricular block, complete: Secondary | ICD-10-CM

## 2019-10-27 LAB — CUP PACEART REMOTE DEVICE CHECK
Battery Remaining Longevity: 77 mo
Battery Voltage: 3.01 V
Brady Statistic AP VP Percent: 0.09 %
Brady Statistic AP VS Percent: 0 %
Brady Statistic AS VP Percent: 99.78 %
Brady Statistic AS VS Percent: 0.13 %
Brady Statistic RA Percent Paced: 0.09 %
Brady Statistic RV Percent Paced: 99.84 %
Date Time Interrogation Session: 20210126172441
Implantable Lead Implant Date: 20081014
Implantable Lead Implant Date: 20081014
Implantable Lead Location: 753859
Implantable Lead Location: 753860
Implantable Lead Model: 5076
Implantable Lead Model: 5076
Implantable Pulse Generator Implant Date: 20170911
Lead Channel Impedance Value: 418 Ohm
Lead Channel Impedance Value: 456 Ohm
Lead Channel Impedance Value: 494 Ohm
Lead Channel Impedance Value: 627 Ohm
Lead Channel Pacing Threshold Amplitude: 0.5 V
Lead Channel Pacing Threshold Amplitude: 0.75 V
Lead Channel Pacing Threshold Pulse Width: 0.4 ms
Lead Channel Pacing Threshold Pulse Width: 0.4 ms
Lead Channel Sensing Intrinsic Amplitude: 14.25 mV
Lead Channel Sensing Intrinsic Amplitude: 14.25 mV
Lead Channel Sensing Intrinsic Amplitude: 2 mV
Lead Channel Sensing Intrinsic Amplitude: 2 mV
Lead Channel Setting Pacing Amplitude: 2 V
Lead Channel Setting Pacing Amplitude: 2.5 V
Lead Channel Setting Pacing Pulse Width: 0.4 ms
Lead Channel Setting Sensing Sensitivity: 5.6 mV

## 2019-11-24 DIAGNOSIS — R600 Localized edema: Secondary | ICD-10-CM | POA: Diagnosis not present

## 2019-11-24 DIAGNOSIS — B9689 Other specified bacterial agents as the cause of diseases classified elsewhere: Secondary | ICD-10-CM | POA: Diagnosis not present

## 2019-11-24 DIAGNOSIS — M810 Age-related osteoporosis without current pathological fracture: Secondary | ICD-10-CM | POA: Diagnosis not present

## 2019-11-24 DIAGNOSIS — I10 Essential (primary) hypertension: Secondary | ICD-10-CM | POA: Diagnosis not present

## 2019-11-24 DIAGNOSIS — R7301 Impaired fasting glucose: Secondary | ICD-10-CM | POA: Diagnosis not present

## 2019-11-24 DIAGNOSIS — G2581 Restless legs syndrome: Secondary | ICD-10-CM | POA: Diagnosis not present

## 2019-11-24 DIAGNOSIS — Z6823 Body mass index (BMI) 23.0-23.9, adult: Secondary | ICD-10-CM | POA: Diagnosis not present

## 2019-11-24 DIAGNOSIS — J208 Acute bronchitis due to other specified organisms: Secondary | ICD-10-CM | POA: Diagnosis not present

## 2019-11-24 DIAGNOSIS — E559 Vitamin D deficiency, unspecified: Secondary | ICD-10-CM | POA: Diagnosis not present

## 2019-11-24 DIAGNOSIS — E785 Hyperlipidemia, unspecified: Secondary | ICD-10-CM | POA: Diagnosis not present

## 2019-11-24 DIAGNOSIS — F418 Other specified anxiety disorders: Secondary | ICD-10-CM | POA: Diagnosis not present

## 2019-12-13 DIAGNOSIS — L578 Other skin changes due to chronic exposure to nonionizing radiation: Secondary | ICD-10-CM | POA: Diagnosis not present

## 2019-12-13 DIAGNOSIS — L570 Actinic keratosis: Secondary | ICD-10-CM | POA: Diagnosis not present

## 2019-12-13 DIAGNOSIS — L821 Other seborrheic keratosis: Secondary | ICD-10-CM | POA: Diagnosis not present

## 2019-12-13 DIAGNOSIS — C44722 Squamous cell carcinoma of skin of right lower limb, including hip: Secondary | ICD-10-CM | POA: Diagnosis not present

## 2020-01-20 ENCOUNTER — Ambulatory Visit: Payer: PPO | Admitting: Sports Medicine

## 2020-01-20 ENCOUNTER — Encounter: Payer: Self-pay | Admitting: Sports Medicine

## 2020-01-20 ENCOUNTER — Ambulatory Visit (INDEPENDENT_AMBULATORY_CARE_PROVIDER_SITE_OTHER): Payer: PPO

## 2020-01-20 ENCOUNTER — Other Ambulatory Visit: Payer: Self-pay | Admitting: Sports Medicine

## 2020-01-20 ENCOUNTER — Other Ambulatory Visit: Payer: Self-pay

## 2020-01-20 DIAGNOSIS — M25571 Pain in right ankle and joints of right foot: Secondary | ICD-10-CM | POA: Diagnosis not present

## 2020-01-20 DIAGNOSIS — L03115 Cellulitis of right lower limb: Secondary | ICD-10-CM

## 2020-01-20 DIAGNOSIS — L97911 Non-pressure chronic ulcer of unspecified part of right lower leg limited to breakdown of skin: Secondary | ICD-10-CM | POA: Diagnosis not present

## 2020-01-20 DIAGNOSIS — M7751 Other enthesopathy of right foot: Secondary | ICD-10-CM

## 2020-01-20 DIAGNOSIS — L02619 Cutaneous abscess of unspecified foot: Secondary | ICD-10-CM

## 2020-01-20 DIAGNOSIS — L84 Corns and callosities: Secondary | ICD-10-CM | POA: Diagnosis not present

## 2020-01-20 DIAGNOSIS — L03119 Cellulitis of unspecified part of limb: Secondary | ICD-10-CM

## 2020-01-20 DIAGNOSIS — L02611 Cutaneous abscess of right foot: Secondary | ICD-10-CM | POA: Diagnosis not present

## 2020-01-20 DIAGNOSIS — IMO0001 Reserved for inherently not codable concepts without codable children: Secondary | ICD-10-CM

## 2020-01-20 DIAGNOSIS — M79671 Pain in right foot: Secondary | ICD-10-CM

## 2020-01-20 DIAGNOSIS — S93401A Sprain of unspecified ligament of right ankle, initial encounter: Secondary | ICD-10-CM

## 2020-01-20 MED ORDER — AMOXICILLIN-POT CLAVULANATE 875-125 MG PO TABS: 1.0000 | ORAL_TABLET | Freq: Two times a day (BID) | ORAL | 0 refills | Status: DC

## 2020-01-20 NOTE — Progress Notes (Signed)
Subjective:  Amanda Bryant is a 79 y.o. female patient who presents to office for evaluation of Right ankle pain. Patient complains of continued pain in the ankle since Sunday and reports that the pain radiates up the leg with swelling and redness reports that she has had this red spot at her shin after bumping it while working out in the yard and reports that it seems like it is healing however has pain along the entire leg as well as ankle especially as her ankle turned inward.  Patient does not recall any specific injury has been taking Tylenol and elevating without improvement.  Patient also complains of pain at the callus on the plantar forefoot the left greater than right.  Patient denies any other pedal complaints at this time.  Patient Active Problem List   Diagnosis Date Noted  . Pain in the coccyx 04/21/2018  . Complete heart block ([place]) 06/10/2016  . 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 Pacemaker-Medtronic   . HTN (hypertension)   . PVC's (premature ventricular contractions)     Current Outpatient Medications on File Prior to Visit  Medication Sig Dispense Refill  . acetaminophen (TYLENOL) 500 MG tablet Take 500 mg by mouth every 6 (six) hours as needed for mild pain.    Marland Kitchen albuterol (VENTOLIN HFA) 108 (90 Base) MCG/ACT inhaler INHALE 2 PUFFS BY MOUTH 4 TIMES DAILY    . alendronate (FOSAMAX) 70 MG tablet Take 70 mg by mouth every 7 (seven) days. Take with a full glass of water on an empty stomach.    Marland Kitchen amoxicillin (AMOXIL) 500 MG capsule Take 1,000 mg by mouth 2 (two) times daily.    Marland Kitchen aspirin EC 81 MG tablet Take 81 mg by mouth daily as needed for mild pain.    . Calcium Carbonate (CALTRATE 600 PO) Take 1 tablet by mouth daily.     . carvedilol (COREG) 6.25 MG tablet Take 6.25 mg by mouth 2 (two) times daily.    . Cholecalciferol (VITAMIN D) 1000 UNITS capsule Take 1,000 Units by mouth daily.      . fish oil-omega-3 fatty acids 1000 MG capsule Take 2 g by mouth  daily.      Marland Kitchen losartan (COZAAR) 50 MG tablet Take 50 mg by mouth daily.    Marland Kitchen omeprazole (PRILOSEC) 20 MG capsule Take 20 mg by mouth daily.      . ondansetron (ZOFRAN ODT) 4 MG disintegrating tablet Take 1 tablet (4 mg total) by mouth every 6 (six) hours as needed for nausea or vomiting. (Patient not taking: Reported on 09/13/2019) 30 tablet 0  . promethazine (PHENERGAN) 25 MG tablet Take one tablet every 6-8 hours as needed (Patient not taking: Reported on 09/13/2019) 30 tablet 2  . rOPINIRole (REQUIP) 4 MG tablet Take 4 mg by mouth at bedtime.    . simvastatin (ZOCOR) 20 MG tablet Take 20 mg by mouth daily.     Marland Kitchen venlafaxine (EFFEXOR-XR) 37.5 MG 24 hr capsule Take 37.5 mg by mouth daily.      Current Facility-Administered Medications on File Prior to Visit  Medication Dose Route Frequency Provider Last Rate Last Admin  . 0.9 %  sodium chloride infusion  500 mL Intravenous Once Jackquline Denmark, MD      . triamcinolone acetonide (KENALOG) 10 MG/ML injection 10 mg  10 mg Other Once [place], Mackenzye Mackel, DPM      . triamcinolone acetonide (KENALOG-40) injection 20 mg  20 mg Other Once Landis Martins, DPM  Allergies  Allergen Reactions  . Codeine Nausea Only  . Prednisone Other (See Comments)    Reaction: Increased heart rate  . Ultram [Tramadol Hcl] Itching  . Tape     Pulls skin off   . Prednisolone Acetate Palpitations    Objective:  General: Alert and oriented x3 in no acute distress  Dermatology: 0.5 cm wound at the anterior shin of the right leg with a fibrogranular base and significant faint erythema over the entire shin and edema with mild warmth, no webspace macerations, no ecchymosis bilateral, all nails x 10 are well manicured.  Callus x1 plantar forefoot bilateral.  Vascular: Dorsalis Pedis and Posterior Tibial pedal pulses  palpable, Capillary Fill Time 3 seconds, scant pedal hair growth bilateral.  Neurology: Gross sensation intact via light touch  bilateral.  Musculoskeletal: Mild to moderate tenderness with palpation at right ankle diffusely but pain does worsen as I palpate up the extensor tendons to the level of the shin at the area of ulceration. Negative talar tilt, Negative tib-fib stress, No instability. No pain with calf compression bilateral. Range of motion within normal limits with mild guarding on right ankle. Strength within normal limits in all groups bilateral.  Digital deformity bilateral.  Fat pad atrophy bilateral.  Gait: Antalgic gait  Xrays  Right ankle: Diffuse arthritis, no fracture, digital deformity.  No other acute findings.   Impression:  Assessment and Plan: Problem List Items Addressed This Visit    None    Visit Diagnoses    Cellulitis and abscess of foot, except toes    -  Primary   Acute right ankle pain       Capsulitis of right ankle       First degree ankle sprain, right, initial encounter       Callus           -Complete examination performed -Xrays reviewed -Discussed treatment options for cellulitis with dorsal shin wound versus possible low-grade sprain and ankle capsulitis and bilateral foot callus -Rx Augmentin to take as instructed for anterior shin wound and cellulitis -Applied small amount of Medihoney to anterior shin wound and Unna boot advised patient to keep clean dry and intact for 5 days then afterwards may remove and use compression sleeve as needed for edema control -Advised patient if ankle is still hurting may return to using her cam boot which she already has at home -At no additional charge mechanically debrided callus plantar forefoot bilateral using a sterile chisel blade without incident -Patient to return to office 2 weeks or sooner if condition worsens.  Landis Martins, DPM

## 2020-01-26 ENCOUNTER — Telehealth: Payer: Self-pay

## 2020-01-26 NOTE — Telephone Encounter (Signed)
Spoke with patient to remind of missed remote transmission 

## 2020-01-28 ENCOUNTER — Other Ambulatory Visit: Payer: Self-pay | Admitting: Sports Medicine

## 2020-01-28 DIAGNOSIS — L02619 Cutaneous abscess of unspecified foot: Secondary | ICD-10-CM

## 2020-02-03 ENCOUNTER — Other Ambulatory Visit: Payer: Self-pay

## 2020-02-03 ENCOUNTER — Ambulatory Visit: Payer: PPO | Admitting: Sports Medicine

## 2020-02-03 DIAGNOSIS — L84 Corns and callosities: Secondary | ICD-10-CM | POA: Diagnosis not present

## 2020-02-03 DIAGNOSIS — L03119 Cellulitis of unspecified part of limb: Secondary | ICD-10-CM | POA: Diagnosis not present

## 2020-02-03 DIAGNOSIS — M7751 Other enthesopathy of right foot: Secondary | ICD-10-CM

## 2020-02-03 DIAGNOSIS — L02619 Cutaneous abscess of unspecified foot: Secondary | ICD-10-CM

## 2020-02-03 DIAGNOSIS — M25571 Pain in right ankle and joints of right foot: Secondary | ICD-10-CM

## 2020-02-03 DIAGNOSIS — S93401D Sprain of unspecified ligament of right ankle, subsequent encounter: Secondary | ICD-10-CM | POA: Diagnosis not present

## 2020-02-03 NOTE — Progress Notes (Signed)
Subjective:  Amanda Bryant is a 79 y.o. female patient who returns to office for follow-up evaluation of right ankle pain and swelling and redness and wound on her right shin.  Patient reports that her pain is better and she only gets a very little amount of pain when she raises her foot up that radiates up the shin but otherwise is doing much better pain at most 2-3 out of 10 with less swelling.  Patient reports that she has completed antibiotic and has been icing and elevating.  Patient denies any constitutional symptoms at this time.  Patient denies any other pedal complaints at this time.  Patient Active Problem List   Diagnosis Date Noted  . Pain in the coccyx 04/21/2018  . Complete heart block ([place]) 06/10/2016  . 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 Pacemaker-Medtronic   . HTN (hypertension)   . PVC's (premature ventricular contractions)     Current Outpatient Medications on File Prior to Visit  Medication Sig Dispense Refill  . acetaminophen (TYLENOL) 500 MG tablet Take 500 mg by mouth every 6 (six) hours as needed for mild pain.    Marland Kitchen albuterol (VENTOLIN HFA) 108 (90 Base) MCG/ACT inhaler INHALE 2 PUFFS BY MOUTH 4 TIMES DAILY    . alendronate (FOSAMAX) 70 MG tablet Take 70 mg by mouth every 7 (seven) days. Take with a full glass of water on an empty stomach.    Marland Kitchen amoxicillin (AMOXIL) 500 MG capsule Take 1,000 mg by mouth 2 (two) times daily.    Marland Kitchen amoxicillin-clavulanate (AUGMENTIN) 875-125 MG tablet Take 1 tablet by mouth 2 (two) times daily. 28 tablet 0  . aspirin EC 81 MG tablet Take 81 mg by mouth daily as needed for mild pain.    . Calcium Carbonate (CALTRATE 600 PO) Take 1 tablet by mouth daily.     . carvedilol (COREG) 6.25 MG tablet Take 6.25 mg by mouth 2 (two) times daily.    . Cholecalciferol (VITAMIN D) 1000 UNITS capsule Take 1,000 Units by mouth daily.      . fish oil-omega-3 fatty acids 1000 MG capsule Take 2 g by mouth daily.      Marland Kitchen losartan (COZAAR)  50 MG tablet Take 50 mg by mouth daily.    Marland Kitchen omeprazole (PRILOSEC) 20 MG capsule Take 20 mg by mouth daily.      . ondansetron (ZOFRAN ODT) 4 MG disintegrating tablet Take 1 tablet (4 mg total) by mouth every 6 (six) hours as needed for nausea or vomiting. (Patient not taking: Reported on 09/13/2019) 30 tablet 0  . promethazine (PHENERGAN) 25 MG tablet Take one tablet every 6-8 hours as needed (Patient not taking: Reported on 09/13/2019) 30 tablet 2  . rOPINIRole (REQUIP) 4 MG tablet Take 4 mg by mouth at bedtime.    . simvastatin (ZOCOR) 20 MG tablet Take 20 mg by mouth daily.     Marland Kitchen venlafaxine (EFFEXOR-XR) 37.5 MG 24 hr capsule Take 37.5 mg by mouth daily.      Current Facility-Administered Medications on File Prior to Visit  Medication Dose Route Frequency Provider Last Rate Last Admin  . 0.9 %  sodium chloride infusion  500 mL Intravenous Once Jackquline Denmark, MD      . triamcinolone acetonide (KENALOG) 10 MG/ML injection 10 mg  10 mg Other Once [place], Darrow Barreiro, DPM      . triamcinolone acetonide (KENALOG-40) injection 20 mg  20 mg Other Once Landis Martins, DPM  Allergies  Allergen Reactions  . Codeine Nausea Only  . Prednisone Other (See Comments)    Reaction: Increased heart rate  . Ultram [Tramadol Hcl] Itching  . Tape     Pulls skin off   . Prednisolone Acetate Palpitations    Objective:  General: Alert and oriented x3 in no acute distress  Dermatology: Prematurely healed anterior shin wound on the right with significant decrease and erythema and edema no warmth no malodor no active drainage.  No webspace macerations, no ecchymosis bilateral, all nails x 10 are well manicured.  Callus x1 plantar forefoot bilateral.  Vascular: Dorsalis Pedis and Posterior Tibial pedal pulses  palpable, Capillary Fill Time 3 seconds, very minimal trace edema bilateral, scant pedal hair growth bilateral.  Neurology: Gross sensation intact via light touch bilateral.  Musculoskeletal: No  residual tenderness to right ankle or anterior shin on right.  Strength within normal limits in all groups bilateral.  Digital deformity bilateral.  Fat pad atrophy bilateral.  Assessment and Plan: Problem List Items Addressed This Visit    None    Visit Diagnoses    Cellulitis and abscess of foot, except toes    -  Primary   Acute right ankle pain       Capsulitis of right ankle       Mild ankle sprain, right, subsequent encounter       Callus           -Complete examination performed -Rediscussed with patient improvement in her ankle pain as well as the cellulitis on her shin -Advised at this time to closely monitor if cellulitis on her shin reoccurs to return to office -Encourage patient to continue with elevation and compression to assist with edema control -Advised patient if her ankle pain reoccurs to return to using her cam boot and to return to office -At no additional charge mechanically debrided callus plantar forefoot bilateral using a sterile chisel blade without incident -Patient to return to office PRN or sooner if condition worsens.  Landis Martins, DPM

## 2020-03-22 DIAGNOSIS — H04123 Dry eye syndrome of bilateral lacrimal glands: Secondary | ICD-10-CM | POA: Diagnosis not present

## 2020-03-22 DIAGNOSIS — H2513 Age-related nuclear cataract, bilateral: Secondary | ICD-10-CM | POA: Diagnosis not present

## 2020-03-22 DIAGNOSIS — H02834 Dermatochalasis of left upper eyelid: Secondary | ICD-10-CM | POA: Diagnosis not present

## 2020-03-22 DIAGNOSIS — H18513 Endothelial corneal dystrophy, bilateral: Secondary | ICD-10-CM | POA: Diagnosis not present

## 2020-03-22 DIAGNOSIS — H0102A Squamous blepharitis right eye, upper and lower eyelids: Secondary | ICD-10-CM | POA: Diagnosis not present

## 2020-03-22 DIAGNOSIS — H43811 Vitreous degeneration, right eye: Secondary | ICD-10-CM | POA: Diagnosis not present

## 2020-03-22 DIAGNOSIS — H0102B Squamous blepharitis left eye, upper and lower eyelids: Secondary | ICD-10-CM | POA: Diagnosis not present

## 2020-03-22 DIAGNOSIS — H02831 Dermatochalasis of right upper eyelid: Secondary | ICD-10-CM | POA: Diagnosis not present

## 2020-03-22 DIAGNOSIS — H35411 Lattice degeneration of retina, right eye: Secondary | ICD-10-CM | POA: Diagnosis not present

## 2020-04-07 DIAGNOSIS — H25811 Combined forms of age-related cataract, right eye: Secondary | ICD-10-CM | POA: Diagnosis not present

## 2020-04-07 DIAGNOSIS — H25812 Combined forms of age-related cataract, left eye: Secondary | ICD-10-CM | POA: Diagnosis not present

## 2020-04-10 DIAGNOSIS — H2511 Age-related nuclear cataract, right eye: Secondary | ICD-10-CM | POA: Diagnosis not present

## 2020-04-19 DIAGNOSIS — H2512 Age-related nuclear cataract, left eye: Secondary | ICD-10-CM | POA: Diagnosis not present

## 2020-04-21 DIAGNOSIS — H25812 Combined forms of age-related cataract, left eye: Secondary | ICD-10-CM | POA: Diagnosis not present

## 2020-04-25 ENCOUNTER — Ambulatory Visit (INDEPENDENT_AMBULATORY_CARE_PROVIDER_SITE_OTHER): Payer: PPO | Admitting: *Deleted

## 2020-04-25 ENCOUNTER — Other Ambulatory Visit: Payer: Self-pay

## 2020-04-25 DIAGNOSIS — I442 Atrioventricular block, complete: Secondary | ICD-10-CM | POA: Diagnosis not present

## 2020-04-26 LAB — CUP PACEART REMOTE DEVICE CHECK
Battery Remaining Longevity: 72 mo
Battery Voltage: 3 V
Brady Statistic AP VP Percent: 0.33 %
Brady Statistic AP VS Percent: 0 %
Brady Statistic AS VP Percent: 99.58 %
Brady Statistic AS VS Percent: 0.09 %
Brady Statistic RA Percent Paced: 0.33 %
Brady Statistic RV Percent Paced: 99.89 %
Date Time Interrogation Session: 20210728104640
Implantable Lead Implant Date: 20081014
Implantable Lead Implant Date: 20081014
Implantable Lead Location: 753859
Implantable Lead Location: 753860
Implantable Lead Model: 5076
Implantable Lead Model: 5076
Implantable Pulse Generator Implant Date: 20170911
Lead Channel Impedance Value: 437 Ohm
Lead Channel Impedance Value: 475 Ohm
Lead Channel Impedance Value: 475 Ohm
Lead Channel Impedance Value: 608 Ohm
Lead Channel Pacing Threshold Amplitude: 0.5 V
Lead Channel Pacing Threshold Amplitude: 0.75 V
Lead Channel Pacing Threshold Pulse Width: 0.4 ms
Lead Channel Pacing Threshold Pulse Width: 0.4 ms
Lead Channel Sensing Intrinsic Amplitude: 1.5 mV
Lead Channel Sensing Intrinsic Amplitude: 1.5 mV
Lead Channel Sensing Intrinsic Amplitude: 12.5 mV
Lead Channel Sensing Intrinsic Amplitude: 12.5 mV
Lead Channel Setting Pacing Amplitude: 2 V
Lead Channel Setting Pacing Amplitude: 2.5 V
Lead Channel Setting Pacing Pulse Width: 0.4 ms
Lead Channel Setting Sensing Sensitivity: 5.6 mV

## 2020-05-01 NOTE — Progress Notes (Signed)
Remote pacemaker transmission.   

## 2020-05-25 DIAGNOSIS — Z1231 Encounter for screening mammogram for malignant neoplasm of breast: Secondary | ICD-10-CM | POA: Diagnosis not present

## 2020-05-25 DIAGNOSIS — L820 Inflamed seborrheic keratosis: Secondary | ICD-10-CM | POA: Diagnosis not present

## 2020-05-25 DIAGNOSIS — F418 Other specified anxiety disorders: Secondary | ICD-10-CM | POA: Diagnosis not present

## 2020-05-25 DIAGNOSIS — M810 Age-related osteoporosis without current pathological fracture: Secondary | ICD-10-CM | POA: Diagnosis not present

## 2020-05-25 DIAGNOSIS — Z139 Encounter for screening, unspecified: Secondary | ICD-10-CM | POA: Diagnosis not present

## 2020-05-25 DIAGNOSIS — L603 Nail dystrophy: Secondary | ICD-10-CM | POA: Diagnosis not present

## 2020-05-25 DIAGNOSIS — E559 Vitamin D deficiency, unspecified: Secondary | ICD-10-CM | POA: Diagnosis not present

## 2020-05-25 DIAGNOSIS — E785 Hyperlipidemia, unspecified: Secondary | ICD-10-CM | POA: Diagnosis not present

## 2020-05-25 DIAGNOSIS — R600 Localized edema: Secondary | ICD-10-CM | POA: Diagnosis not present

## 2020-05-25 DIAGNOSIS — I10 Essential (primary) hypertension: Secondary | ICD-10-CM | POA: Diagnosis not present

## 2020-05-25 DIAGNOSIS — G2581 Restless legs syndrome: Secondary | ICD-10-CM | POA: Diagnosis not present

## 2020-05-25 DIAGNOSIS — Z6824 Body mass index (BMI) 24.0-24.9, adult: Secondary | ICD-10-CM | POA: Diagnosis not present

## 2020-05-25 DIAGNOSIS — R7301 Impaired fasting glucose: Secondary | ICD-10-CM | POA: Diagnosis not present

## 2020-06-06 DIAGNOSIS — R399 Unspecified symptoms and signs involving the genitourinary system: Secondary | ICD-10-CM | POA: Diagnosis not present

## 2020-06-12 ENCOUNTER — Ambulatory Visit: Payer: PPO | Admitting: Podiatry

## 2020-06-12 ENCOUNTER — Other Ambulatory Visit: Payer: Self-pay

## 2020-06-12 ENCOUNTER — Ambulatory Visit (INDEPENDENT_AMBULATORY_CARE_PROVIDER_SITE_OTHER): Payer: PPO

## 2020-06-12 ENCOUNTER — Other Ambulatory Visit: Payer: Self-pay | Admitting: Podiatry

## 2020-06-12 DIAGNOSIS — S93401D Sprain of unspecified ligament of right ankle, subsequent encounter: Secondary | ICD-10-CM | POA: Diagnosis not present

## 2020-06-12 DIAGNOSIS — L03119 Cellulitis of unspecified part of limb: Secondary | ICD-10-CM

## 2020-06-12 DIAGNOSIS — M84374A Stress fracture, right foot, initial encounter for fracture: Secondary | ICD-10-CM

## 2020-06-12 DIAGNOSIS — T25121A Burn of first degree of right foot, initial encounter: Secondary | ICD-10-CM | POA: Diagnosis not present

## 2020-06-12 MED ORDER — SILVER SULFADIAZINE 1 % EX CREA: 1.0000 "application " | TOPICAL_CREAM | Freq: Every day | CUTANEOUS | 0 refills | Status: DC

## 2020-06-12 MED ORDER — CEPHALEXIN 500 MG PO CAPS: 500.0000 mg | ORAL_CAPSULE | Freq: Three times a day (TID) | ORAL | 0 refills | Status: DC

## 2020-06-14 DIAGNOSIS — Z9181 History of falling: Secondary | ICD-10-CM | POA: Diagnosis not present

## 2020-06-14 DIAGNOSIS — Z0000 Encounter for general adult medical examination without abnormal findings: Secondary | ICD-10-CM | POA: Diagnosis not present

## 2020-06-14 DIAGNOSIS — Z1331 Encounter for screening for depression: Secondary | ICD-10-CM | POA: Diagnosis not present

## 2020-06-14 DIAGNOSIS — E785 Hyperlipidemia, unspecified: Secondary | ICD-10-CM | POA: Diagnosis not present
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
## 2020-06-20 ENCOUNTER — Ambulatory Visit: Payer: PPO | Admitting: Sports Medicine

## 2020-06-20 ENCOUNTER — Other Ambulatory Visit: Payer: Self-pay

## 2020-06-20 ENCOUNTER — Encounter: Payer: Self-pay | Admitting: Sports Medicine

## 2020-06-20 DIAGNOSIS — T25121D Burn of first degree of right foot, subsequent encounter: Secondary | ICD-10-CM | POA: Diagnosis not present

## 2020-06-20 DIAGNOSIS — M79671 Pain in right foot: Secondary | ICD-10-CM

## 2020-06-20 DIAGNOSIS — L03119 Cellulitis of unspecified part of limb: Secondary | ICD-10-CM

## 2020-06-20 MED ORDER — GABAPENTIN 100 MG PO CAPS: 100.0000 mg | ORAL_CAPSULE | Freq: Every day | ORAL | 3 refills | Status: DC
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
## 2020-06-29 ENCOUNTER — Ambulatory Visit: Payer: PPO | Admitting: Podiatry

## 2020-06-29 DIAGNOSIS — Z1231 Encounter for screening mammogram for malignant neoplasm of breast: Secondary | ICD-10-CM | POA: Diagnosis not present

## 2020-07-06 DIAGNOSIS — Z23 Encounter for immunization: Secondary | ICD-10-CM | POA: Diagnosis not present

## 2020-07-07 ENCOUNTER — Other Ambulatory Visit: Payer: Self-pay

## 2020-07-07 ENCOUNTER — Ambulatory Visit: Payer: PPO | Admitting: Sports Medicine

## 2020-07-07 ENCOUNTER — Encounter: Payer: Self-pay | Admitting: Sports Medicine

## 2020-07-07 DIAGNOSIS — T25121D Burn of first degree of right foot, subsequent encounter: Secondary | ICD-10-CM

## 2020-07-07 DIAGNOSIS — M79671 Pain in right foot: Secondary | ICD-10-CM

## 2020-07-07 DIAGNOSIS — M779 Enthesopathy, unspecified: Secondary | ICD-10-CM | POA: Diagnosis not present

## 2020-07-07 DIAGNOSIS — I739 Peripheral vascular disease, unspecified: Secondary | ICD-10-CM

## 2020-07-07 DIAGNOSIS — L03119 Cellulitis of unspecified part of limb: Secondary | ICD-10-CM | POA: Diagnosis not present
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
## 2020-07-13 DIAGNOSIS — M25512 Pain in left shoulder: Secondary | ICD-10-CM | POA: Diagnosis not present

## 2020-07-21 ENCOUNTER — Encounter: Payer: Self-pay | Admitting: Sports Medicine

## 2020-07-21 ENCOUNTER — Other Ambulatory Visit: Payer: Self-pay

## 2020-07-21 ENCOUNTER — Ambulatory Visit (INDEPENDENT_AMBULATORY_CARE_PROVIDER_SITE_OTHER): Payer: PPO | Admitting: Sports Medicine

## 2020-07-21 DIAGNOSIS — B351 Tinea unguium: Secondary | ICD-10-CM

## 2020-07-21 DIAGNOSIS — M79609 Pain in unspecified limb: Secondary | ICD-10-CM | POA: Diagnosis not present

## 2020-07-21 DIAGNOSIS — L600 Ingrowing nail: Secondary | ICD-10-CM

## 2020-07-21 DIAGNOSIS — M79671 Pain in right foot: Secondary | ICD-10-CM

## 2020-07-21 NOTE — Progress Notes (Signed)
Subjective: Amanda Bryant is a 79 y.o. female patient seen in office for nail trim and reports that her pain on the right foot has resolved reports that she is doing good the only sensation she has is that her right foot feels a little stiff or tight.  Patient is wondering if she can start to wean from the brace that she is using on the right.  Patient has no other pedal complaints at this time.  Patient Active Problem List   Diagnosis Date Noted  . Pain in the coccyx 04/21/2018  . Complete heart block ([place]) 06/10/2016  . 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 Pacemaker-Medtronic   . HTN (hypertension)   . PVC's (premature ventricular contractions)    Current Outpatient Medications on File Prior to Visit  Medication Sig Dispense Refill  . acetaminophen (TYLENOL) 500 MG tablet Take 500 mg by mouth every 6 (six) hours as needed for mild pain.    Marland Kitchen albuterol (VENTOLIN HFA) 108 (90 Base) MCG/ACT inhaler INHALE 2 PUFFS BY MOUTH 4 TIMES DAILY    . alendronate (FOSAMAX) 70 MG tablet Take 70 mg by mouth every 7 (seven) days. Take with a full glass of water on an empty stomach.    Marland Kitchen amoxicillin (AMOXIL) 500 MG capsule Take 1,000 mg by mouth 2 (two) times daily.    Marland Kitchen amoxicillin-clavulanate (AUGMENTIN) 875-125 MG tablet Take 1 tablet by mouth 2 (two) times daily. 28 tablet 0  . aspirin EC 81 MG tablet Take 81 mg by mouth daily as needed for mild pain.    . Calcium Carbonate (CALTRATE 600 PO) Take 1 tablet by mouth daily.     . carvedilol (COREG) 6.25 MG tablet Take 6.25 mg by mouth 2 (two) times daily.    . cephALEXin (KEFLEX) 500 MG capsule Take 1 capsule (500 mg total) by mouth 3 (three) times daily. 21 capsule 0  . Cholecalciferol (VITAMIN D) 1000 UNITS capsule Take 1,000 Units by mouth daily.      . fish oil-omega-3 fatty acids 1000 MG capsule Take 2 g by mouth daily.      Marland Kitchen gabapentin (NEURONTIN) 100 MG capsule Take 1 capsule (100 mg total) by mouth at bedtime. 30 capsule 3  .  losartan (COZAAR) 50 MG tablet Take 50 mg by mouth daily.    Marland Kitchen omeprazole (PRILOSEC) 20 MG capsule Take 20 mg by mouth daily.      . ondansetron (ZOFRAN ODT) 4 MG disintegrating tablet Take 1 tablet (4 mg total) by mouth every 6 (six) hours as needed for nausea or vomiting. (Patient not taking: Reported on 09/13/2019) 30 tablet 0  . promethazine (PHENERGAN) 25 MG tablet Take one tablet every 6-8 hours as needed (Patient not taking: Reported on 09/13/2019) 30 tablet 2  . rOPINIRole (REQUIP) 4 MG tablet Take 4 mg by mouth at bedtime.    . silver sulfADIAZINE (SILVADENE) 1 % cream Apply 1 application topically daily. 50 g 0  . simvastatin (ZOCOR) 20 MG tablet Take 20 mg by mouth daily.     Marland Kitchen venlafaxine (EFFEXOR-XR) 37.5 MG 24 hr capsule Take 37.5 mg by mouth daily.      Current Facility-Administered Medications on File Prior to Visit  Medication Dose Route Frequency Provider Last Rate Last Admin  . 0.9 %  sodium chloride infusion  500 mL Intravenous Once Jackquline Denmark, MD      . triamcinolone acetonide (KENALOG) 10 MG/ML injection 10 mg  10 mg Other Once Landis Martins, DPM      .  triamcinolone acetonide (KENALOG-40) injection 20 mg  20 mg Other Once Landis Martins, DPM       Allergies  Allergen Reactions  . Codeine Nausea Only  . Prednisone Other (See Comments)    Reaction: Increased heart rate  . Ultram [Tramadol Hcl] Itching  . Tape     Pulls skin off   . Prednisolone Acetate Palpitations    Recent Results (from the past 2160 hour(s))  CUP PACEART REMOTE DEVICE CHECK     Status: None   Collection Time: 04/26/20 10:46 AM  Result Value Ref Range   Date Time Interrogation Session [id number]    Pulse Generator Manufacturer MERM    Pulse Gen Model A2DR01 Advisa DR MRI    Pulse Gen Serial Number [id number]    Clinic Name [facility] Pulse Generator Type Implantable Pulse Generator    Implantable Pulse Generator Implant Date 14481856    Implantable Lead  Manufacturer [facility]    Implantable Lead Model 5076 CapSureFix Novus    Implantable Lead Serial Number [id number]    Implantable Lead Implant Date 31497026    Implantable Lead Location [id number]    Implantable Lead Manufacturer MERM    Implantable Lead Model 5076 CapSureFix Novus    Implantable Lead Serial Number [id number]    Implantable Lead Implant Date 37858850    Implantable Lead Location [id number]    Lead Channel Setting Sensing Sensitivity 5.6 mV   Lead Channel Setting Pacing Amplitude 2 V   Lead Channel Setting Pacing Pulse Width 0.4 ms   Lead Channel Setting Pacing Amplitude 2.5 V   Lead Channel Impedance Value 475 ohm   Lead Channel Impedance Value 437 ohm   Lead Channel Sensing Intrinsic Amplitude 1.5 mV   Lead Channel Sensing Intrinsic Amplitude 1.5 mV   Lead Channel Pacing Threshold Amplitude 0.5 V   Lead Channel Pacing Threshold Pulse Width 0.4 ms   Lead Channel Impedance Value 608 ohm   Lead Channel Impedance Value 475 ohm   Lead Channel Sensing Intrinsic Amplitude 12.5 mV   Lead Channel Sensing Intrinsic Amplitude 12.5 mV   Lead Channel Pacing Threshold Amplitude 0.75 V   Lead Channel Pacing Threshold Pulse Width 0.4 ms   Battery Status OK    Battery Remaining Longevity 72 mo   Battery Voltage 3.00 V   Brady Statistic RA Percent Paced 0.33 %   Brady Statistic RV Percent Paced 99.89 %   Brady Statistic AP VP Percent 0.33 %   Brady Statistic AS VP Percent 99.58 %   Brady Statistic AP VS Percent 0 %   Brady Statistic AS VS Percent 0.09 %    Objective: There were no vitals filed for this visit.  General: Patient is awake, alert, oriented x 3 and in no acute distress.  Dermatology: Skin is warm and dry no open lesions, no active drainage, no other acute signs of infection.  Nails 1 through 5 on right and 2 through 5 on the left are thickened and mildly elongated with incurvation but no acute ingrowing noted.   Vascular: Dorsalis Pedis pulse = 1/4 Bilateral,  Posterior  Tibial pulse = 1/4 Bilateral,  Capillary Fill Time < 5 seconds.  Varicosities bilateral.  Neurologic: Occasional tingling to right foot with subjective tightness that has much improved.  Musculosketal: There is no reproducible pain right forefoot at the fourth and fifth ray.  There is significant fat pad atrophy and prominent metatarsals and very narrow feet noted bilateral.  No results for input(s):  GRAMSTAIN, LABORGA in the last 8760 hours.  Assessment and Plan:  Problem List Items Addressed This Visit    None    Visit Diagnoses    Pain due to onychomycosis of nail    -  Primary   Ingrown nail       Right foot pain       Resolved     -Examined patient  -Mechanically debrided nails x9 using a sterile nail nipper without incident -Advised patient states her right foot pain is doing much better may wean or discontinue use of ankle gauntlet/fascial brace -Encourage patient to continue with gentle range of motion elevation to help with circulation and to eliminate the tight feeling that she has on the right foot -Patient to return as needed for routine foot care or sooner if problems or issues arise.  Landis Martins, DPM

## 2020-07-25 ENCOUNTER — Ambulatory Visit (INDEPENDENT_AMBULATORY_CARE_PROVIDER_SITE_OTHER): Payer: PPO

## 2020-07-25 DIAGNOSIS — I442 Atrioventricular block, complete: Secondary | ICD-10-CM | POA: Diagnosis not present

## 2020-07-27 LAB — CUP PACEART REMOTE DEVICE CHECK
Battery Remaining Longevity: 67 mo
Battery Voltage: 3 V
Brady Statistic AP VP Percent: 0.86 %
Brady Statistic AP VS Percent: 0 %
Brady Statistic AS VP Percent: 99.13 %
Brady Statistic AS VS Percent: 0.01 %
Brady Statistic RA Percent Paced: 0.86 %
Brady Statistic RV Percent Paced: 99.97 %
Date Time Interrogation Session: 20211028124352
Implantable Lead Implant Date: 20081014
Implantable Lead Implant Date: 20081014
Implantable Lead Location: 753859
Implantable Lead Location: 753860
Implantable Lead Model: 5076
Implantable Lead Model: 5076
Implantable Pulse Generator Implant Date: 20170911
Lead Channel Impedance Value: 437 Ohm
Lead Channel Impedance Value: 475 Ohm
Lead Channel Impedance Value: 513 Ohm
Lead Channel Impedance Value: 646 Ohm
Lead Channel Pacing Threshold Amplitude: 0.375 V
Lead Channel Pacing Threshold Amplitude: 0.75 V
Lead Channel Pacing Threshold Pulse Width: 0.4 ms
Lead Channel Pacing Threshold Pulse Width: 0.4 ms
Lead Channel Sensing Intrinsic Amplitude: 1.25 mV
Lead Channel Sensing Intrinsic Amplitude: 1.25 mV
Lead Channel Sensing Intrinsic Amplitude: 11.875 mV
Lead Channel Sensing Intrinsic Amplitude: 11.875 mV
Lead Channel Setting Pacing Amplitude: 2 V
Lead Channel Setting Pacing Amplitude: 2.5 V
Lead Channel Setting Pacing Pulse Width: 0.4 ms
Lead Channel Setting Sensing Sensitivity: 5.6 mV

## 2020-08-01 NOTE — Progress Notes (Signed)
Remote pacemaker transmission.   

## 2020-09-11 DIAGNOSIS — M7551 Bursitis of right shoulder: Secondary | ICD-10-CM | POA: Diagnosis not present

## 2020-09-11 DIAGNOSIS — G8929 Other chronic pain: Secondary | ICD-10-CM | POA: Diagnosis not present

## 2020-09-11 DIAGNOSIS — M25511 Pain in right shoulder: Secondary | ICD-10-CM | POA: Diagnosis not present

## 2020-09-11 DIAGNOSIS — M75121 Complete rotator cuff tear or rupture of right shoulder, not specified as traumatic: Secondary | ICD-10-CM | POA: Diagnosis not present

## 2020-09-13 DIAGNOSIS — M1712 Unilateral primary osteoarthritis, left knee: Secondary | ICD-10-CM | POA: Diagnosis not present

## 2020-10-11 ENCOUNTER — Other Ambulatory Visit: Payer: Self-pay

## 2020-10-11 ENCOUNTER — Ambulatory Visit: Payer: PPO | Admitting: Sports Medicine

## 2020-10-11 ENCOUNTER — Encounter: Payer: Self-pay | Admitting: Sports Medicine

## 2020-10-11 DIAGNOSIS — M79609 Pain in unspecified limb: Secondary | ICD-10-CM

## 2020-10-11 DIAGNOSIS — B351 Tinea unguium: Secondary | ICD-10-CM | POA: Diagnosis not present

## 2020-10-11 DIAGNOSIS — I739 Peripheral vascular disease, unspecified: Secondary | ICD-10-CM

## 2020-10-11 DIAGNOSIS — L84 Corns and callosities: Secondary | ICD-10-CM

## 2020-10-11 NOTE — Progress Notes (Signed)
Subjective: Amanda Bryant is a 80 y.o. female patient seen in office for nail trim and reports some soreness at left 4th toe. No other issues noted.   Patient Active Problem List   Diagnosis Date Noted  . Pain in the coccyx 04/21/2018  . Complete heart block ([place]) 06/10/2016  . Atrioventricular block, complete ([place]) 10/10/2011  . Pacemaker-Medtronic   . HTN (hypertension)   . PVC's (premature ventricular contractions)    Current Outpatient Medications on File Prior to Visit  Medication Sig Dispense Refill  . acetaminophen (TYLENOL) 500 MG tablet Take 500 mg by mouth every 6 (six) hours as needed for mild pain.    Marland Kitchen albuterol (VENTOLIN HFA) 108 (90 Base) MCG/ACT inhaler INHALE 2 PUFFS BY MOUTH 4 TIMES DAILY    . alendronate (FOSAMAX) 70 MG tablet Take 70 mg by mouth every 7 (seven) days. Take with a full glass of water on an empty stomach.    Marland Kitchen amoxicillin (AMOXIL) 500 MG capsule Take 1,000 mg by mouth 2 (two) times daily.    Marland Kitchen amoxicillin-clavulanate (AUGMENTIN) 875-125 MG tablet Take 1 tablet by mouth 2 (two) times daily. 28 tablet 0  . aspirin EC 81 MG tablet Take 81 mg by mouth daily as needed for mild pain.    . Calcium Carbonate (CALTRATE 600 PO) Take 1 tablet by mouth daily.     . carvedilol (COREG) 6.25 MG tablet Take 6.25 mg by mouth 2 (two) times daily.    . cephALEXin (KEFLEX) 500 MG capsule Take 1 capsule (500 mg total) by mouth 3 (three) times daily. 21 capsule 0  . Cholecalciferol (VITAMIN D) 1000 UNITS capsule Take 1,000 Units by mouth daily.      . ciclopirox (PENLAC) 8 % solution Apply topically.    . fish oil-omega-3 fatty acids 1000 MG capsule Take 2 g by mouth daily.      Marland Kitchen gabapentin (NEURONTIN) 100 MG capsule Take 1 capsule (100 mg total) by mouth at bedtime. 30 capsule 3  . losartan (COZAAR) 50 MG tablet Take 50 mg by mouth daily.    . nitrofurantoin, macrocrystal-monohydrate, (MACROBID) 100 MG capsule Take 100 mg by mouth 2 (two) times daily.    Marland Kitchen omeprazole  (PRILOSEC) 20 MG capsule Take 20 mg by mouth daily.      . ondansetron (ZOFRAN ODT) 4 MG disintegrating tablet Take 1 tablet (4 mg total) by mouth every 6 (six) hours as needed for nausea or vomiting. (Patient not taking: Reported on 09/13/2019) 30 tablet 0  . prednisoLONE acetate (PRED FORTE) 1 % ophthalmic suspension 1 drop 4 (four) times daily.    . promethazine (PHENERGAN) 12.5 MG tablet Take 12.5 mg by mouth every 6 (six) hours as needed.    . promethazine (PHENERGAN) 25 MG tablet Take one tablet every 6-8 hours as needed (Patient not taking: Reported on 09/13/2019) 30 tablet 2  . RESTASIS 0.05 % ophthalmic emulsion 1 drop 2 (two) times daily.    Marland Kitchen rOPINIRole (REQUIP) 4 MG tablet Take 4 mg by mouth at bedtime.    . silver sulfADIAZINE (SILVADENE) 1 % cream Apply 1 application topically daily. 50 g 0  . simvastatin (ZOCOR) 20 MG tablet Take 20 mg by mouth daily.     Marland Kitchen venlafaxine (EFFEXOR-XR) 37.5 MG 24 hr capsule Take 37.5 mg by mouth daily.      Current Facility-Administered Medications on File Prior to Visit  Medication Dose Route Frequency Provider Last Rate Last Admin  . 0.9 %  sodium chloride infusion  500 mL Intravenous Once Jackquline Denmark, MD      . triamcinolone acetonide (KENALOG) 10 MG/ML injection 10 mg  10 mg Other Once [place], Nicolle Heward, DPM      . triamcinolone acetonide (KENALOG-40) injection 20 mg  20 mg Other Once Landis Martins, DPM       Allergies  Allergen Reactions  . Codeine Nausea Only  . Prednisone Other (See Comments)    Reaction: Increased heart rate  . Ultram [Tramadol Hcl] Itching  . Tape     Pulls skin off   . Prednisolone Acetate Palpitations    Recent Results (from the past 2160 hour(s))  CUP PACEART REMOTE DEVICE CHECK     Status: None   Collection Time: 07/27/20 12:43 PM  Result Value Ref Range   Date Time Interrogation Session [id number]    Pulse Generator Manufacturer MERM    Pulse Gen Model A2DR01 Advisa DR MRI    Pulse Gen Serial Number  [id number]    Clinic Name [facility] Pulse Generator Type Implantable Pulse Generator    Implantable Pulse Generator Implant Date 19147829    Implantable Lead Manufacturer [facility] [place] [facility] [place]    Implantable Lead Model 5076 CapSureFix Novus    Implantable Lead Serial Number [id number]    Implantable Lead Implant Date 56213086    Implantable Lead Location [id number]    Implantable Lead Manufacturer MERM    Implantable Lead Model 5076 CapSureFix Novus    Implantable Lead Serial Number [id number]    Implantable Lead Implant Date [id number]    Implantable Lead Location [id number]    Lead Channel Setting Sensing Sensitivity 5.6 mV   Lead Channel Setting Pacing Amplitude 2 V   Lead Channel Setting Pacing Pulse Width 0.4 ms   Lead Channel Setting Pacing Amplitude 2.5 V   Lead Channel Impedance Value 475 ohm   Lead Channel Impedance Value 437 ohm   Lead Channel Sensing Intrinsic Amplitude 1.25 mV   Lead Channel Sensing Intrinsic Amplitude 1.25 mV   Lead Channel Pacing Threshold Amplitude 0.375 V   Lead Channel Pacing Threshold Pulse Width 0.4 ms   Lead Channel Impedance Value 646 ohm   Lead Channel Impedance Value 513 ohm   Lead Channel Sensing Intrinsic Amplitude 11.875 mV   Lead Channel Sensing Intrinsic Amplitude 11.875 mV   Lead Channel Pacing Threshold Amplitude 0.75 V   Lead Channel Pacing Threshold Pulse Width 0.4 ms   Battery Status OK    Battery Remaining Longevity 67 mo   Battery Voltage 3.00 V   Brady Statistic RA Percent Paced 0.86 %   Brady Statistic RV Percent Paced 99.97 %   Brady Statistic AP VP Percent 0.86 %   Brady Statistic AS VP Percent 99.13 %   Brady Statistic AP VS Percent 0 %   Brady Statistic AS VS Percent 0.01 %    Objective: There were no vitals filed for this visit.  General: Patient is awake, alert, oriented x 3 and in no acute distress.  Dermatology: Skin is warm and dry no open lesions, no active drainage, no other acute signs of infection.  Nails 1  through 5 on right and 2 through 5 on the left are thickened and mildly elongated with incurvation but no acute ingrowing noted.   Vascular: Dorsalis Pedis pulse = 1/4 Bilateral,  Posterior Tibial pulse = 1/4 Bilateral,  Capillary Fill Time < 5 seconds.  Varicosities bilateral.  Neurologic: Occasional tingling to right foot with subjective tightness that  has much improved.  Musculosketal: There is no reproducible pain right forefoot at the fourth and fifth ray.  There is significant fat pad atrophy and prominent metatarsals and very narrow feet noted bilateral.  No results for input(s): GRAMSTAIN, LABORGA in the last 8760 hours.  Assessment and Plan:  Problem List Items Addressed This Visit   None   Visit Diagnoses    Pain due to onychomycosis of nail    -  Primary   Relevant Medications   ciclopirox (PENLAC) 8 % solution   nitrofurantoin, macrocrystal-monohydrate, (MACROBID) 100 MG capsule   PVD (peripheral vascular disease) (HCC)       Callus         -Examined patient  -Mechanically debrided nails x9 using a sterile nail nipper without incident -Dispensed toe cap for left 4th toe  -Continue with met pads  -Patient to return as needed for routine foot care or sooner if problems or issues arise.  Landis Martins, DPM

## 2020-11-07 DIAGNOSIS — N390 Urinary tract infection, site not specified: Secondary | ICD-10-CM | POA: Diagnosis not present

## 2020-11-13 DIAGNOSIS — L570 Actinic keratosis: Secondary | ICD-10-CM | POA: Diagnosis not present

## 2020-11-13 DIAGNOSIS — B001 Herpesviral vesicular dermatitis: Secondary | ICD-10-CM | POA: Diagnosis not present

## 2020-11-15 DIAGNOSIS — L570 Actinic keratosis: Secondary | ICD-10-CM | POA: Diagnosis not present

## 2020-11-20 ENCOUNTER — Ambulatory Visit (INDEPENDENT_AMBULATORY_CARE_PROVIDER_SITE_OTHER): Payer: PPO

## 2020-11-20 DIAGNOSIS — I442 Atrioventricular block, complete: Secondary | ICD-10-CM | POA: Diagnosis not present

## 2020-11-20 LAB — CUP PACEART REMOTE DEVICE CHECK
Battery Remaining Longevity: 67 mo
Battery Voltage: 3 V
Brady Statistic AP VP Percent: 1.43 %
Brady Statistic AP VS Percent: 0 %
Brady Statistic AS VP Percent: 98.42 %
Brady Statistic AS VS Percent: 0.15 %
Brady Statistic RA Percent Paced: 1.42 %
Brady Statistic RV Percent Paced: 99.65 %
Date Time Interrogation Session: 20220221102039
Implantable Lead Implant Date: 20081014
Implantable Lead Implant Date: 20081014
Implantable Lead Location: 753859
Implantable Lead Location: 753860
Implantable Lead Model: 5076
Implantable Lead Model: 5076
Implantable Pulse Generator Implant Date: 20170911
Lead Channel Impedance Value: 437 Ohm
Lead Channel Impedance Value: 475 Ohm
Lead Channel Impedance Value: 494 Ohm
Lead Channel Impedance Value: 608 Ohm
Lead Channel Pacing Threshold Amplitude: 0.375 V
Lead Channel Pacing Threshold Amplitude: 0.625 V
Lead Channel Pacing Threshold Pulse Width: 0.4 ms
Lead Channel Pacing Threshold Pulse Width: 0.4 ms
Lead Channel Sensing Intrinsic Amplitude: 1.125 mV
Lead Channel Sensing Intrinsic Amplitude: 1.125 mV
Lead Channel Sensing Intrinsic Amplitude: 12.875 mV
Lead Channel Sensing Intrinsic Amplitude: 12.875 mV
Lead Channel Setting Pacing Amplitude: 2 V
Lead Channel Setting Pacing Amplitude: 2.5 V
Lead Channel Setting Pacing Pulse Width: 0.4 ms
Lead Channel Setting Sensing Sensitivity: 5.6 mV

## 2020-11-24 NOTE — Progress Notes (Signed)
Remote pacemaker transmission.   

## 2020-11-30 DIAGNOSIS — Z6823 Body mass index (BMI) 23.0-23.9, adult: Secondary | ICD-10-CM | POA: Diagnosis not present

## 2020-11-30 DIAGNOSIS — E559 Vitamin D deficiency, unspecified: Secondary | ICD-10-CM | POA: Diagnosis not present

## 2020-11-30 DIAGNOSIS — E785 Hyperlipidemia, unspecified: Secondary | ICD-10-CM | POA: Diagnosis not present

## 2020-11-30 DIAGNOSIS — R600 Localized edema: Secondary | ICD-10-CM | POA: Diagnosis not present

## 2020-11-30 DIAGNOSIS — R7301 Impaired fasting glucose: Secondary | ICD-10-CM | POA: Diagnosis not present

## 2020-11-30 DIAGNOSIS — M810 Age-related osteoporosis without current pathological fracture: Secondary | ICD-10-CM | POA: Diagnosis not present

## 2020-11-30 DIAGNOSIS — F418 Other specified anxiety disorders: Secondary | ICD-10-CM | POA: Diagnosis not present

## 2020-11-30 DIAGNOSIS — G2581 Restless legs syndrome: Secondary | ICD-10-CM | POA: Diagnosis not present

## 2020-11-30 DIAGNOSIS — I10 Essential (primary) hypertension: Secondary | ICD-10-CM | POA: Diagnosis not present

## 2020-12-19 DIAGNOSIS — L570 Actinic keratosis: Secondary | ICD-10-CM | POA: Diagnosis not present

## 2020-12-19 DIAGNOSIS — L820 Inflamed seborrheic keratosis: Secondary | ICD-10-CM | POA: Diagnosis not present

## 2020-12-20 DIAGNOSIS — H0102B Squamous blepharitis left eye, upper and lower eyelids: Secondary | ICD-10-CM | POA: Diagnosis not present

## 2020-12-20 DIAGNOSIS — H0102A Squamous blepharitis right eye, upper and lower eyelids: Secondary | ICD-10-CM | POA: Diagnosis not present

## 2020-12-20 DIAGNOSIS — Z961 Presence of intraocular lens: Secondary | ICD-10-CM | POA: Diagnosis not present

## 2020-12-20 DIAGNOSIS — H16223 Keratoconjunctivitis sicca, not specified as Sjogren's, bilateral: Secondary | ICD-10-CM | POA: Diagnosis not present

## 2021-01-09 ENCOUNTER — Ambulatory Visit: Payer: PPO | Admitting: Sports Medicine

## 2021-02-19 ENCOUNTER — Ambulatory Visit (INDEPENDENT_AMBULATORY_CARE_PROVIDER_SITE_OTHER): Payer: PPO

## 2021-02-19 DIAGNOSIS — I442 Atrioventricular block, complete: Secondary | ICD-10-CM

## 2021-02-21 LAB — CUP PACEART REMOTE DEVICE CHECK
Battery Remaining Longevity: 60 mo
Battery Voltage: 3 V
Brady Statistic AP VP Percent: 0.59 %
Brady Statistic AP VS Percent: 0 %
Brady Statistic AS VP Percent: 99.15 %
Brady Statistic AS VS Percent: 0.25 %
Brady Statistic RA Percent Paced: 0.59 %
Brady Statistic RV Percent Paced: 99.64 %
Date Time Interrogation Session: 20220523160010
Implantable Lead Implant Date: 20081014
Implantable Lead Implant Date: 20081014
Implantable Lead Location: 753859
Implantable Lead Location: 753860
Implantable Lead Model: 5076
Implantable Lead Model: 5076
Implantable Pulse Generator Implant Date: 20170911
Lead Channel Impedance Value: 418 Ohm
Lead Channel Impedance Value: 456 Ohm
Lead Channel Impedance Value: 475 Ohm
Lead Channel Impedance Value: 589 Ohm
Lead Channel Pacing Threshold Amplitude: 0.5 V
Lead Channel Pacing Threshold Amplitude: 0.75 V
Lead Channel Pacing Threshold Pulse Width: 0.4 ms
Lead Channel Pacing Threshold Pulse Width: 0.4 ms
Lead Channel Sensing Intrinsic Amplitude: 1.25 mV
Lead Channel Sensing Intrinsic Amplitude: 1.25 mV
Lead Channel Sensing Intrinsic Amplitude: 15.375 mV
Lead Channel Sensing Intrinsic Amplitude: 15.375 mV
Lead Channel Setting Pacing Amplitude: 2 V
Lead Channel Setting Pacing Amplitude: 2.5 V
Lead Channel Setting Pacing Pulse Width: 0.4 ms
Lead Channel Setting Sensing Sensitivity: 5.6 mV

## 2021-03-06 ENCOUNTER — Other Ambulatory Visit: Payer: Self-pay | Admitting: Podiatry

## 2021-03-06 NOTE — Telephone Encounter (Signed)
Please advise 

## 2021-03-12 NOTE — Progress Notes (Signed)
Remote pacemaker transmission.   

## 2021-03-19 DIAGNOSIS — N952 Postmenopausal atrophic vaginitis: Secondary | ICD-10-CM | POA: Diagnosis not present

## 2021-03-19 DIAGNOSIS — Z01419 Encounter for gynecological examination (general) (routine) without abnormal findings: Secondary | ICD-10-CM | POA: Diagnosis not present

## 2021-03-19 DIAGNOSIS — Z87440 Personal history of urinary (tract) infections: Secondary | ICD-10-CM | POA: Diagnosis not present

## 2021-03-19 DIAGNOSIS — R300 Dysuria: Secondary | ICD-10-CM | POA: Diagnosis not present

## 2021-03-19 DIAGNOSIS — Z6825 Body mass index (BMI) 25.0-25.9, adult: Secondary | ICD-10-CM | POA: Diagnosis not present

## 2021-05-07 ENCOUNTER — Other Ambulatory Visit (HOSPITAL_COMMUNITY): Payer: Self-pay | Admitting: Medical

## 2021-05-07 ENCOUNTER — Other Ambulatory Visit: Payer: Self-pay

## 2021-05-07 ENCOUNTER — Ambulatory Visit (HOSPITAL_COMMUNITY)
Admission: RE | Admit: 2021-05-07 | Discharge: 2021-05-07 | Disposition: A | Discharge status: Home / Self Care | Payer: PPO | Source: Ambulatory Visit | Attending: Cardiovascular Disease | Admitting: Cardiovascular Disease

## 2021-05-07 DIAGNOSIS — M79661 Pain in right lower leg: Secondary | ICD-10-CM | POA: Diagnosis not present

## 2021-05-07 DIAGNOSIS — M79604 Pain in right leg: Secondary | ICD-10-CM | POA: Diagnosis not present

## 2021-05-07 DIAGNOSIS — M7989 Other specified soft tissue disorders: Secondary | ICD-10-CM | POA: Diagnosis not present

## 2021-05-07 DIAGNOSIS — M25561 Pain in right knee: Secondary | ICD-10-CM | POA: Insufficient documentation

## 2021-05-07 DIAGNOSIS — M79605 Pain in left leg: Secondary | ICD-10-CM | POA: Diagnosis not present

## 2021-05-10 DIAGNOSIS — L821 Other seborrheic keratosis: Secondary | ICD-10-CM | POA: Diagnosis not present

## 2021-05-10 DIAGNOSIS — L820 Inflamed seborrheic keratosis: Secondary | ICD-10-CM | POA: Diagnosis not present

## 2021-05-10 DIAGNOSIS — L578 Other skin changes due to chronic exposure to nonionizing radiation: Secondary | ICD-10-CM | POA: Diagnosis not present

## 2021-05-10 DIAGNOSIS — L570 Actinic keratosis: Secondary | ICD-10-CM | POA: Diagnosis not present

## 2021-05-11 ENCOUNTER — Other Ambulatory Visit: Payer: Self-pay | Admitting: Medical

## 2021-05-11 DIAGNOSIS — M7121 Synovial cyst of popliteal space [Baker], right knee: Secondary | ICD-10-CM

## 2021-05-11 DIAGNOSIS — M25561 Pain in right knee: Secondary | ICD-10-CM

## 2021-05-16 ENCOUNTER — Other Ambulatory Visit: Payer: PPO

## 2021-05-16 DIAGNOSIS — M1711 Unilateral primary osteoarthritis, right knee: Secondary | ICD-10-CM | POA: Diagnosis not present

## 2021-05-16 DIAGNOSIS — M25561 Pain in right knee: Secondary | ICD-10-CM | POA: Diagnosis not present

## 2021-05-21 ENCOUNTER — Ambulatory Visit (INDEPENDENT_AMBULATORY_CARE_PROVIDER_SITE_OTHER): Payer: PPO

## 2021-05-21 DIAGNOSIS — I442 Atrioventricular block, complete: Secondary | ICD-10-CM

## 2021-05-22 LAB — CUP PACEART REMOTE DEVICE CHECK
Battery Remaining Longevity: 59 mo
Battery Voltage: 2.99 V
Brady Statistic AP VP Percent: 0.41 %
Brady Statistic AP VS Percent: 0 %
Brady Statistic AS VP Percent: 99.49 %
Brady Statistic AS VS Percent: 0.1 %
Brady Statistic RA Percent Paced: 0.41 %
Brady Statistic RV Percent Paced: 99.85 %
Date Time Interrogation Session: 20220823134737
Implantable Lead Implant Date: 20081014
Implantable Lead Implant Date: 20081014
Implantable Lead Location: 753859
Implantable Lead Location: 753860
Implantable Lead Model: 5076
Implantable Lead Model: 5076
Implantable Pulse Generator Implant Date: 20170911
Lead Channel Impedance Value: 437 Ohm
Lead Channel Impedance Value: 475 Ohm
Lead Channel Impedance Value: 513 Ohm
Lead Channel Impedance Value: 646 Ohm
Lead Channel Pacing Threshold Amplitude: 0.375 V
Lead Channel Pacing Threshold Amplitude: 0.625 V
Lead Channel Pacing Threshold Pulse Width: 0.4 ms
Lead Channel Pacing Threshold Pulse Width: 0.4 ms
Lead Channel Sensing Intrinsic Amplitude: 1.5 mV
Lead Channel Sensing Intrinsic Amplitude: 1.5 mV
Lead Channel Sensing Intrinsic Amplitude: 19.75 mV
Lead Channel Sensing Intrinsic Amplitude: 19.75 mV
Lead Channel Setting Pacing Amplitude: 2 V
Lead Channel Setting Pacing Amplitude: 2.5 V
Lead Channel Setting Pacing Pulse Width: 0.4 ms
Lead Channel Setting Sensing Sensitivity: 5.6 mV

## 2021-05-23 ENCOUNTER — Ambulatory Visit
Admission: RE | Admit: 2021-05-23 | Discharge: 2021-05-23 | Disposition: A | Discharge status: Home / Self Care | Payer: PPO | Source: Ambulatory Visit | Attending: Medical | Admitting: Medical

## 2021-05-23 DIAGNOSIS — M7121 Synovial cyst of popliteal space [Baker], right knee: Secondary | ICD-10-CM

## 2021-05-23 DIAGNOSIS — M25561 Pain in right knee: Secondary | ICD-10-CM

## 2021-06-02 DIAGNOSIS — M1711 Unilateral primary osteoarthritis, right knee: Secondary | ICD-10-CM | POA: Insufficient documentation

## 2021-06-05 DIAGNOSIS — E559 Vitamin D deficiency, unspecified: Secondary | ICD-10-CM | POA: Diagnosis not present

## 2021-06-05 DIAGNOSIS — E785 Hyperlipidemia, unspecified: Secondary | ICD-10-CM | POA: Diagnosis not present

## 2021-06-05 DIAGNOSIS — F418 Other specified anxiety disorders: Secondary | ICD-10-CM | POA: Diagnosis not present

## 2021-06-05 DIAGNOSIS — G2581 Restless legs syndrome: Secondary | ICD-10-CM | POA: Diagnosis not present

## 2021-06-05 DIAGNOSIS — M810 Age-related osteoporosis without current pathological fracture: Secondary | ICD-10-CM | POA: Diagnosis not present

## 2021-06-05 DIAGNOSIS — R600 Localized edema: Secondary | ICD-10-CM | POA: Diagnosis not present

## 2021-06-05 DIAGNOSIS — I700 Atherosclerosis of aorta: Secondary | ICD-10-CM | POA: Diagnosis not present

## 2021-06-05 DIAGNOSIS — R7301 Impaired fasting glucose: Secondary | ICD-10-CM | POA: Diagnosis not present

## 2021-06-05 DIAGNOSIS — I10 Essential (primary) hypertension: Secondary | ICD-10-CM | POA: Diagnosis not present

## 2021-06-05 DIAGNOSIS — Z1231 Encounter for screening mammogram for malignant neoplasm of breast: Secondary | ICD-10-CM | POA: Diagnosis not present

## 2021-06-06 DIAGNOSIS — M1711 Unilateral primary osteoarthritis, right knee: Secondary | ICD-10-CM | POA: Diagnosis not present

## 2021-06-06 DIAGNOSIS — M25561 Pain in right knee: Secondary | ICD-10-CM | POA: Diagnosis not present

## 2021-06-07 NOTE — Progress Notes (Signed)
Remote pacemaker transmission.   

## 2021-07-02 DIAGNOSIS — Z9181 History of falling: Secondary | ICD-10-CM | POA: Diagnosis not present

## 2021-07-02 DIAGNOSIS — E785 Hyperlipidemia, unspecified: Secondary | ICD-10-CM | POA: Diagnosis not present

## 2021-07-02 DIAGNOSIS — Z1331 Encounter for screening for depression: Secondary | ICD-10-CM | POA: Diagnosis not present

## 2021-07-02 DIAGNOSIS — Z139 Encounter for screening, unspecified: Secondary | ICD-10-CM | POA: Diagnosis not present

## 2021-07-02 DIAGNOSIS — Z0000 Encounter for general adult medical examination without abnormal findings: Secondary | ICD-10-CM | POA: Diagnosis not present

## 2021-07-05 DIAGNOSIS — Z1231 Encounter for screening mammogram for malignant neoplasm of breast: Secondary | ICD-10-CM | POA: Diagnosis not present

## 2021-07-11 DIAGNOSIS — L814 Other melanin hyperpigmentation: Secondary | ICD-10-CM | POA: Diagnosis not present

## 2021-07-11 DIAGNOSIS — L570 Actinic keratosis: Secondary | ICD-10-CM | POA: Diagnosis not present

## 2021-07-11 DIAGNOSIS — C44319 Basal cell carcinoma of skin of other parts of face: Secondary | ICD-10-CM | POA: Diagnosis not present

## 2021-07-11 DIAGNOSIS — L821 Other seborrheic keratosis: Secondary | ICD-10-CM | POA: Diagnosis not present

## 2021-07-11 DIAGNOSIS — L578 Other skin changes due to chronic exposure to nonionizing radiation: Secondary | ICD-10-CM | POA: Diagnosis not present

## 2021-08-03 DIAGNOSIS — Z23 Encounter for immunization: Secondary | ICD-10-CM | POA: Diagnosis not present

## 2021-08-09 DIAGNOSIS — M25561 Pain in right knee: Secondary | ICD-10-CM | POA: Diagnosis not present

## 2021-08-20 ENCOUNTER — Ambulatory Visit (INDEPENDENT_AMBULATORY_CARE_PROVIDER_SITE_OTHER): Payer: PPO

## 2021-08-20 DIAGNOSIS — I442 Atrioventricular block, complete: Secondary | ICD-10-CM | POA: Diagnosis not present

## 2021-08-25 LAB — CUP PACEART REMOTE DEVICE CHECK
Battery Remaining Longevity: 54 mo
Battery Voltage: 2.99 V
Brady Statistic AP VP Percent: 0.39 %
Brady Statistic AP VS Percent: 0 %
Brady Statistic AS VP Percent: 99.34 %
Brady Statistic AS VS Percent: 0.27 %
Brady Statistic RA Percent Paced: 0.39 %
Brady Statistic RV Percent Paced: 99.71 %
Date Time Interrogation Session: 20221123221257
Implantable Lead Implant Date: 20081014
Implantable Lead Implant Date: 20081014
Implantable Lead Location: 753859
Implantable Lead Location: 753860
Implantable Lead Model: 5076
Implantable Lead Model: 5076
Implantable Pulse Generator Implant Date: 20170911
Lead Channel Impedance Value: 418 Ohm
Lead Channel Impedance Value: 456 Ohm
Lead Channel Impedance Value: 475 Ohm
Lead Channel Impedance Value: 608 Ohm
Lead Channel Pacing Threshold Amplitude: 0.375 V
Lead Channel Pacing Threshold Amplitude: 0.75 V
Lead Channel Pacing Threshold Pulse Width: 0.4 ms
Lead Channel Pacing Threshold Pulse Width: 0.4 ms
Lead Channel Sensing Intrinsic Amplitude: 1 mV
Lead Channel Sensing Intrinsic Amplitude: 1 mV
Lead Channel Sensing Intrinsic Amplitude: 12.25 mV
Lead Channel Sensing Intrinsic Amplitude: 12.25 mV
Lead Channel Setting Pacing Amplitude: 2 V
Lead Channel Setting Pacing Amplitude: 2.5 V
Lead Channel Setting Pacing Pulse Width: 0.4 ms
Lead Channel Setting Sensing Sensitivity: 5.6 mV

## 2021-08-29 NOTE — Progress Notes (Signed)
Remote pacemaker transmission.   

## 2021-09-05 DIAGNOSIS — L820 Inflamed seborrheic keratosis: Secondary | ICD-10-CM | POA: Diagnosis not present

## 2021-09-18 DIAGNOSIS — R319 Hematuria, unspecified: Secondary | ICD-10-CM | POA: Diagnosis not present

## 2021-09-18 DIAGNOSIS — R300 Dysuria: Secondary | ICD-10-CM | POA: Diagnosis not present

## 2021-10-11 DIAGNOSIS — M79641 Pain in right hand: Secondary | ICD-10-CM | POA: Diagnosis not present

## 2021-10-11 DIAGNOSIS — M79644 Pain in right finger(s): Secondary | ICD-10-CM | POA: Insufficient documentation

## 2021-10-18 DIAGNOSIS — M13849 Other specified arthritis, unspecified hand: Secondary | ICD-10-CM | POA: Diagnosis not present

## 2021-10-18 DIAGNOSIS — M13841 Other specified arthritis, right hand: Secondary | ICD-10-CM | POA: Diagnosis not present

## 2021-10-18 DIAGNOSIS — M19041 Primary osteoarthritis, right hand: Secondary | ICD-10-CM | POA: Insufficient documentation

## 2021-10-18 DIAGNOSIS — M13839 Other specified arthritis, unspecified wrist: Secondary | ICD-10-CM | POA: Diagnosis not present

## 2021-10-18 DIAGNOSIS — M79644 Pain in right finger(s): Secondary | ICD-10-CM | POA: Diagnosis not present

## 2021-11-16 DIAGNOSIS — R197 Diarrhea, unspecified: Secondary | ICD-10-CM | POA: Diagnosis not present

## 2021-11-19 ENCOUNTER — Ambulatory Visit (INDEPENDENT_AMBULATORY_CARE_PROVIDER_SITE_OTHER): Payer: Medicare Other

## 2021-11-19 DIAGNOSIS — I442 Atrioventricular block, complete: Secondary | ICD-10-CM | POA: Diagnosis not present

## 2021-11-20 LAB — CUP PACEART REMOTE DEVICE CHECK
Battery Remaining Longevity: 53 mo
Battery Voltage: 2.99 V
Brady Statistic AP VP Percent: 0.6 %
Brady Statistic AP VS Percent: 0 %
Brady Statistic AS VP Percent: 98.32 %
Brady Statistic AS VS Percent: 1.08 %
Brady Statistic RA Percent Paced: 0.6 %
Brady Statistic RV Percent Paced: 98.81 %
Date Time Interrogation Session: 20230220154252
Implantable Lead Implant Date: 20081014
Implantable Lead Implant Date: 20081014
Implantable Lead Location: 753859
Implantable Lead Location: 753860
Implantable Lead Model: 5076
Implantable Lead Model: 5076
Implantable Pulse Generator Implant Date: 20170911
Lead Channel Impedance Value: 437 Ohm
Lead Channel Impedance Value: 475 Ohm
Lead Channel Impedance Value: 513 Ohm
Lead Channel Impedance Value: 646 Ohm
Lead Channel Pacing Threshold Amplitude: 0.375 V
Lead Channel Pacing Threshold Amplitude: 0.625 V
Lead Channel Pacing Threshold Pulse Width: 0.4 ms
Lead Channel Pacing Threshold Pulse Width: 0.4 ms
Lead Channel Sensing Intrinsic Amplitude: 1.25 mV
Lead Channel Sensing Intrinsic Amplitude: 1.25 mV
Lead Channel Sensing Intrinsic Amplitude: 13 mV
Lead Channel Sensing Intrinsic Amplitude: 13 mV
Lead Channel Setting Pacing Amplitude: 2 V
Lead Channel Setting Pacing Amplitude: 2.5 V
Lead Channel Setting Pacing Pulse Width: 0.4 ms
Lead Channel Setting Sensing Sensitivity: 5.6 mV

## 2021-11-22 NOTE — Progress Notes (Signed)
Remote pacemaker transmission.   

## 2021-11-28 ENCOUNTER — Telehealth: Payer: Self-pay | Admitting: Internal Medicine

## 2021-11-28 NOTE — Telephone Encounter (Signed)
Spoke with pt re message and pt has noted significant swelling to legs  Per pt no change in diet Pt states goes to bed at night and awakens in the am and size is the same Per pt had stomach flu last week and feels better Pt has  no other symptoms just leg swelling Pt is overdue for appt Appt made with Dr Caryl Comes on 12/03/21 at 2:30 pm . Will forward to Dr Caryl Comes for review and recommendations /cy ?

## 2021-11-28 NOTE — Telephone Encounter (Signed)
New message ? ? ? ?Patient walked regarding her pacemaker.  She sent a remote pacemaker check in on 11-19-21.  Patient c/o that her feet and legs are swelling.  This is happening even when she is off her legs.  Could this be from her pacemaker?  She is on her second pacemaker since 2017 and she had leg swelling at that time. No sob, chest pain and no dizziness.  Please advise. ?

## 2021-11-29 ENCOUNTER — Encounter: Payer: Self-pay | Admitting: Podiatry

## 2021-11-29 ENCOUNTER — Ambulatory Visit: Payer: PPO | Admitting: Podiatry

## 2021-11-29 ENCOUNTER — Other Ambulatory Visit: Payer: Self-pay

## 2021-11-29 DIAGNOSIS — L603 Nail dystrophy: Secondary | ICD-10-CM | POA: Insufficient documentation

## 2021-11-29 DIAGNOSIS — I739 Peripheral vascular disease, unspecified: Secondary | ICD-10-CM | POA: Diagnosis not present

## 2021-11-29 DIAGNOSIS — Q828 Other specified congenital malformations of skin: Secondary | ICD-10-CM | POA: Diagnosis not present

## 2021-11-29 DIAGNOSIS — M79609 Pain in unspecified limb: Secondary | ICD-10-CM

## 2021-11-29 DIAGNOSIS — L03116 Cellulitis of left lower limb: Secondary | ICD-10-CM

## 2021-11-29 DIAGNOSIS — L820 Inflamed seborrheic keratosis: Secondary | ICD-10-CM | POA: Insufficient documentation

## 2021-11-29 DIAGNOSIS — B351 Tinea unguium: Secondary | ICD-10-CM

## 2021-11-29 MED ORDER — CEPHALEXIN 500 MG PO CAPS: 500.0000 mg | ORAL_CAPSULE | Freq: Three times a day (TID) | ORAL | 0 refills | Status: AC

## 2021-12-03 ENCOUNTER — Ambulatory Visit
Admission: RE | Admit: 2021-12-03 | Discharge: 2021-12-03 | Disposition: A | Discharge status: Home / Self Care | Payer: Medicare Other | Source: Ambulatory Visit | Attending: Internal Medicine | Admitting: Internal Medicine

## 2021-12-03 ENCOUNTER — Encounter: Payer: Self-pay | Admitting: Internal Medicine

## 2021-12-03 ENCOUNTER — Other Ambulatory Visit: Payer: Self-pay

## 2021-12-03 ENCOUNTER — Ambulatory Visit: Payer: Medicare Other | Admitting: Internal Medicine

## 2021-12-03 VITALS — BP 120/64 | HR 64 | Ht 65.0 in | Wt 142.0 lb

## 2021-12-03 DIAGNOSIS — Z950 Presence of cardiac pacemaker: Secondary | ICD-10-CM | POA: Diagnosis not present

## 2021-12-03 DIAGNOSIS — M810 Age-related osteoporosis without current pathological fracture: Secondary | ICD-10-CM | POA: Diagnosis not present

## 2021-12-03 DIAGNOSIS — I10 Essential (primary) hypertension: Secondary | ICD-10-CM | POA: Diagnosis not present

## 2021-12-03 DIAGNOSIS — M47814 Spondylosis without myelopathy or radiculopathy, thoracic region: Secondary | ICD-10-CM | POA: Diagnosis not present

## 2021-12-03 DIAGNOSIS — E785 Hyperlipidemia, unspecified: Secondary | ICD-10-CM | POA: Diagnosis not present

## 2021-12-03 DIAGNOSIS — I442 Atrioventricular block, complete: Secondary | ICD-10-CM

## 2021-12-03 DIAGNOSIS — R7301 Impaired fasting glucose: Secondary | ICD-10-CM | POA: Diagnosis not present

## 2021-12-03 DIAGNOSIS — J9811 Atelectasis: Secondary | ICD-10-CM | POA: Diagnosis not present

## 2021-12-03 DIAGNOSIS — E559 Vitamin D deficiency, unspecified: Secondary | ICD-10-CM | POA: Diagnosis not present

## 2021-12-03 NOTE — Patient Instructions (Signed)
Medication Instructions:  ?Your physician recommends that you continue on your current medications as directed. Please refer to the Current Medication list given to you today. ? ?*If you need a refill on your cardiac medications before your next appointment, please call your pharmacy* ? ? ?Lab Work: ?None ordered. ? ?If you have labs (blood work) drawn today and your tests are completely normal, you will receive your results only by: ?MyChart Message (if you have MyChart) OR ?A paper copy in the mail ?If you have any lab test that is abnormal or we need to change your treatment, we will call you to review the results. ? ? ?Testing/Procedures: ?A chest x-ray takes a picture of the organs and structures inside the chest, including the heart, lungs, and blood vessels. This test can show several things, including, whether the heart is enlarges; whether fluid is building up in the lungs; and whether pacemaker / defibrillator leads are still in place.  ? ? ? ?Follow-Up: ?At [facility], you and your health needs are our priority.  As part of our continuing mission to provide you with exceptional heart care, we have created designated Provider Care Teams.  These Care Teams include your primary Cardiologist (physician) and Advanced Practice Providers (APPs -  Physician Assistants and Nurse Practitioners) who all work together to provide you with the care you need, when you need it. ? ?We recommend signing up for the patient portal called "MyChart".  Sign up information is provided on this After Visit Summary.  MyChart is used to connect with patients for Virtual Visits (Telemedicine).  Patients are able to view lab/test results, encounter notes, upcoming appointments, etc.  Non-urgent messages can be sent to your provider as well.   ?To learn more about what you can do with MyChart, go to [email or web address].   ? ?Your next appointment:   ?12 months with Dr Caryl Comes ?

## 2021-12-03 NOTE — Telephone Encounter (Signed)
Pt has appointment scheduled with Dr Caryl Comes on 12/03/2021. ?

## 2021-12-03 NOTE — Progress Notes (Signed)
? ? ? ? ?Patient Care Team: ?Nicoletta Dress, MD as PCP - General (Internal Medicine) ? ? ?HPI ? ?Amanda Bryant is a 81 y.o. female ?Seen in followup for syncope which turned out to be associated by intermittent complete heart block documented by loop recorder insertion 2008 and subsequently replaced by a dual-chamber pacemaker-Medtronic also in 2008 GEN change 9/17  .  ? ?Echo 8/17 EF normal  ? ?The patient denies chest pain, shortness of breath, nocturnal dyspnea, orthopnea  There have been no palpitations, lightheadedness or syncope.  Complains of some edema on the left.  Associate with superficial cellulitis.  Saw the podiatrist.  Started on Keflex.  Improving..   ? ?Past Medical History:  ?Diagnosis Date  ? Arthritis   ? Complete heart block ([place])   ? intermittent, documented by prev ILR  ? HTN (hypertension)   ? Pacemaker   ? Implanted 2008  ? PVC's (premature ventricular contractions)   ? S/P cardiac catheterization   ? 2001- nonobstructive disease; nl lv function  ? Shingles   ? ? ?Past Surgical History:  ?Procedure Laterality Date  ? BACK SURGERY  2010  ? [place]  ? [place]  ? CHOLECYSTECTOMY    ? COLONOSCOPY  07/22/2011  ? Colonic Polyp, mild sigmoid diverticulosis, small internal hemorrhoids. Bx: hyperplastic polyps.  ? EP IMPLANTABLE DEVICE N/A 06/10/2016  ? Procedure: PPM Nature conservation officer;  Surgeon: Will Meredith Leeds, MD;  Location: [facility];  Service: Cardiovascular;  Laterality: N/A;  ? ERCP  04/26/2015  ? Normal cholangiogram- no hilar/righthepatic duct stricture visualized.  ? ? ?Current Outpatient Medications  ?Medication Sig Dispense Refill  ? acetaminophen (TYLENOL) 500 MG tablet Take 500 mg by mouth every 6 (six) hours as needed for mild pain.    ? albuterol (VENTOLIN HFA) 108 (90 Base) MCG/ACT inhaler INHALE 2 PUFFS BY MOUTH 4 TIMES DAILY    ? alendronate (FOSAMAX) 70 MG tablet Take 70 mg by mouth every 7 (seven) days. Take with a full glass of  water on an empty stomach.    ? Calcium Carbonate (CALTRATE 600 PO) Take 1 tablet by mouth daily.     ? carvedilol (COREG) 6.25 MG tablet Take 6.25 mg by mouth 2 (two) times daily.    ? cephALEXin (KEFLEX) 500 MG capsule Take 1 capsule (500 mg total) by mouth 3 (three) times daily for 10 days. 30 capsule 0  ? Cholecalciferol (VITAMIN D) 1000 UNITS capsule Take 1,000 Units by mouth daily.      ? ciclopirox (PENLAC) 8 % solution Apply topically.    ? fish oil-omega-3 fatty acids 1000 MG capsule Take 2 g by mouth daily.      ? losartan (COZAAR) 50 MG tablet Take 50 mg by mouth daily.    ? omeprazole (PRILOSEC) 20 MG capsule Take 20 mg by mouth daily.      ? RESTASIS 0.05 % ophthalmic emulsion 1 drop 2 (two) times daily.    ? rOPINIRole (REQUIP) 4 MG tablet Take 4 mg by mouth at bedtime.    ? simvastatin (ZOCOR) 20 MG tablet Take 20 mg by mouth daily.     ? venlafaxine (EFFEXOR-XR) 37.5 MG 24 hr capsule Take 37.5 mg by mouth daily.     ? ? ? ?Allergies  ?Allergen Reactions  ? Codeine Nausea Only  ? Prednisone Other (See Comments)  ?  Reaction: Increased heart rate  ? Ultram [Tramadol Hcl] Itching  ?  Tape   ?  Pulls skin off   ? Prednisolone Acetate Palpitations  ? ? ?Review of Systems negative except from HPI and PMH ? ?Physical Exam ?BP 120/64   Pulse 64   Ht '5\' 5"'$  (1.651 m)   Wt 142 lb (64.4 kg)   SpO2 97%   BMI 23.63 kg/m?  ?Well developed and well nourished in no acute distress ?HENT normal ?Neck supple with JVP-flat ?Clear ?Device pocket well healed; without hematoma or erythema.  There is no tethering  ?Regular rate and rhythm, no  gallop No / murmur ?Abd-soft with active BS ?No Clubbing cyanosis 1+ edema on the left with superficial erythema ?Skin-warm and dry ?A & Oriented  Grossly normal sensory and motor function ? ?ECG sinus at 64 with P synchronous pacing ?23/16/46 ?  ? ? ?Assessment and  Plan ? ?Complete heart block  Stable  ? ?Pacemaker-Medtronic    ? ?Hypertension   ? ?Syncope ? ?Lower extremity  cellulitis ? ?Some interesting electrograms identified on the new anterior deep system with atrial signal fixed in relationship to the preceding QRS/paced beat suggesting sensing of a Q wave but this should not be the case from an atrial lead.  This suggesting that it is an an atrial tachycardia but its relationship effectively to the QRS makes this also unlikely.  We will get a chest x-ray ? ?Blood pressure is well controlled.  Continue her on Cozaar 50 and carvedilol 6.25 twice daily ? ?Continue Keflex for her cellulitis.  No evidence of device infection ? ? ?

## 2021-12-04 ENCOUNTER — Ambulatory Visit: Payer: Medicare Other | Admitting: Sports Medicine

## 2021-12-04 ENCOUNTER — Encounter: Payer: Self-pay | Admitting: Sports Medicine

## 2021-12-04 VITALS — Temp 97.4°F

## 2021-12-04 DIAGNOSIS — L03119 Cellulitis of unspecified part of limb: Secondary | ICD-10-CM | POA: Diagnosis not present

## 2021-12-04 DIAGNOSIS — I739 Peripheral vascular disease, unspecified: Secondary | ICD-10-CM | POA: Diagnosis not present

## 2021-12-04 DIAGNOSIS — M25572 Pain in left ankle and joints of left foot: Secondary | ICD-10-CM

## 2021-12-04 LAB — CUP PACEART INCLINIC DEVICE CHECK
Date Time Interrogation Session: 20230306164025
Implantable Lead Implant Date: 20081014
Implantable Lead Implant Date: 20081014
Implantable Lead Location: 753859
Implantable Lead Location: 753860
Implantable Lead Model: 5076
Implantable Lead Model: 5076
Implantable Pulse Generator Implant Date: 20170911

## 2021-12-04 NOTE — Progress Notes (Signed)
Subjective: ?Amanda Bryant is a 81 y.o. female patient seen in office for follow-up evaluation of left foot and ankle cellulitis states that since last week the area still is a little swollen but feels better with the antibiotic she was previously given.  Patient denies nausea vomiting fever chills or any constitutional symptoms at this time and states that the redness is slowly going away. ? ?Patient Active Problem List  ? Diagnosis Date Noted  ? Inflamed seborrheic keratosis 11/29/2021  ? 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 Pain of right thumb 10/11/2021  ? Osteoarthritis of right knee 06/02/2021  ? Pain in joint of right knee 05/07/2021  ? Pain in the coccyx 04/21/2018  ? Complete heart block ([place]) 06/10/2016  ? Atrioventricular block, complete ([place]) 10/10/2011  ? Pacemaker-Medtronic   ? HTN (hypertension)   ? PVC's (premature ventricular contractions)   ? ?Current Outpatient Medications on File Prior to Visit  ?Medication Sig Dispense Refill  ? acetaminophen (TYLENOL) 500 MG tablet Take 500 mg by mouth every 6 (six) hours as needed for mild pain.    ? albuterol (VENTOLIN HFA) 108 (90 Base) MCG/ACT inhaler INHALE 2 PUFFS BY MOUTH 4 TIMES DAILY    ? alendronate (FOSAMAX) 70 MG tablet Take 70 mg by mouth every 7 (seven) days. Take with a full glass of water on an empty stomach.    ? Calcium Carbonate (CALTRATE 600 PO) Take 1 tablet by mouth daily.     ? carvedilol (COREG) 6.25 MG tablet Take 6.25 mg by mouth 2 (two) times daily.    ? cephALEXin (KEFLEX) 500 MG capsule Take 1 capsule (500 mg total) by mouth 3 (three) times daily for 10 days. 30 capsule 0  ? Cholecalciferol (VITAMIN D) 1000 UNITS capsule Take 1,000 Units by mouth daily.      ? ciclopirox (PENLAC) 8 % solution Apply topically.    ? fish oil-omega-3 fatty acids 1000 MG capsule Take 2 g by mouth daily.      ? losartan (COZAAR) 50 MG tablet Take 50 mg by mouth daily.    ? omeprazole (PRILOSEC) 20 MG capsule Take 20 mg by  mouth daily.      ? RESTASIS 0.05 % ophthalmic emulsion 1 drop 2 (two) times daily.    ? rOPINIRole (REQUIP) 4 MG tablet Take 4 mg by mouth at bedtime.    ? simvastatin (ZOCOR) 20 MG tablet Take 20 mg by mouth daily.     ? venlafaxine (EFFEXOR-XR) 37.5 MG 24 hr capsule Take 37.5 mg by mouth daily.     ? ?Current Facility-Administered Medications on File Prior to Visit  ?Medication Dose Route Frequency Provider Last Rate Last Admin  ? 0.9 %  sodium chloride infusion  500 mL Intravenous Once Jackquline Denmark, MD      ? triamcinolone acetonide (KENALOG) 10 MG/ML injection 10 mg  10 mg Other Once Landis Martins, DPM      ? triamcinolone acetonide (KENALOG-40) injection 20 mg  20 mg Other Once Landis Martins, DPM      ? ?Allergies  ?Allergen Reactions  ? Codeine Nausea Only  ? Prednisone Other (See Comments)  ?  Reaction: Increased heart rate  ? Ultram [Tramadol Hcl] Itching  ? Tape   ?  Pulls skin off   ? Prednisolone Acetate Palpitations  ? ? ?Recent Results (from the past 2160 hour(s))  ?CUP PACEART REMOTE DEVICE CHECK     Status: None  ? Collection Time:  11/19/21  3:42 PM  ?Result Value Ref Range  ? Date Time Interrogation Session [phone]   ? Pulse Insurance claims handler MERM   ? Pulse Gen Model A2DR01 Advisa DR MRI   ? Pulse Gen Serial Number [id number]   ? Clinic Name [facility]   ? Implantable Pulse Generator Type Implantable Pulse Generator   ? Implantable Pulse Generator Implant Date [id number]   ? Implantable Lead Manufacturer MERM   ? Implantable Lead Model 5076 CapSureFix Novus   ? Implantable Lead Serial Number [id number]   ? Implantable Lead Implant Date [id number]   ? Implantable Lead Location [id number]   ? Implantable Lead Manufacturer MERM   ? Implantable Lead Model 5076 CapSureFix Novus   ? Implantable Lead Serial Number [id number]   ? Implantable Lead Implant Date [id number]   ? Implantable Lead Location [phone]   ? Lead Channel Setting Sensing Sensitivity 5.6 mV  ? Lead Channel Setting Pacing  Amplitude 2 V  ? Lead Channel Setting Pacing Pulse Width 0.4 ms  ? Lead Channel Setting Pacing Amplitude 2.5 V  ? Lead Channel Impedance Value 475 ohm  ? Lead Channel Impedance Value 437 ohm  ? Lead Channel Sensing Intrinsic Amplitude 1.25 mV  ? Lead Channel Sensing Intrinsic Amplitude 1.25 mV  ? Lead Channel Pacing Threshold Amplitude 0.375 V  ? Lead Channel Pacing Threshold Pulse Width 0.4 ms  ? Lead Channel Impedance Value 646 ohm  ? Lead Channel Impedance Value 513 ohm  ? Lead Channel Sensing Intrinsic Amplitude 13 mV  ? Lead Channel Sensing Intrinsic Amplitude 13 mV  ? Lead Channel Pacing Threshold Amplitude 0.625 V  ? Lead Channel Pacing Threshold Pulse Width 0.4 ms  ? Battery Status OK   ? Battery Remaining Longevity 53 mo  ? Battery Voltage 2.99 V  ? Loletha Grayer Statistic RA Percent Paced 0.6 %  ? Brady Statistic RV Percent Paced 98.81 %  ? Brady Statistic AP VP Percent 0.6 %  ? Brady Statistic AS VP Percent 98.32 %  ? Brady Statistic AP VS Percent 0 %  ? Brady Statistic AS VS Percent 1.08 %  ? ? ?Objective: ?There were no vitals filed for this visit. ? ?General: Patient is awake, alert, oriented x 3 and in no acute distress. ? ?Dermatology: Skin is warm and dry no open lesions, however there is a small abrasion to the anterior shin on the left no active drainage, no other acute signs of infection.  There is faint erythema that appears to be improving at the medial and lateral ankle and lateral foot, nails 1 through 5 on right and 2 through 5 on the left are short and thick and well manicured was trimmed last visit. ? ? ?  ?Vascular: Dorsalis Pedis pulse = 1/4 Bilateral,  Posterior Tibial pulse = 1/4 Bilateral,  Capillary Fill Time < 5 seconds.  Varicosities bilateral.  Trace edema noted to the left lower extremity likely consistent with PVD. ? ?Neurologic: Gross sensation present via light touch on the left. ? ?Musculosketal: There is no reproducible pain left foot or ankle, no pain to palpation to calf, negative  Homans' sign. ? ?No results for input(s): GRAMSTAIN, LABORGA in the last 8760 hours. ? ?Assessment and Plan:  ?Problem List Items Addressed This Visit   ?None ?Visit Diagnoses   ? ? Cellulitis of foot    -  Primary  ? PVD (peripheral vascular disease) ([place])      ? Pain, joint, ankle and foot, left      ? ?  ? ?-  Examined patient  ?-Discussed continued care for resolving cellulitis secondary to PVD ?-Advised patient to finish Keflex antibiotic ?-Advised patient to be consistent with using her compression garments to help with edema ?-Advised patient to elevate and to closely monitor and follow-up with her heart doctor as scheduled ?-Advised patient if symptoms worsen or fail to continue to improve return to office sooner ?-Return as scheduled or as above. ? ?Landis Martins, DPM ?

## 2021-12-07 NOTE — Progress Notes (Signed)
?  Subjective:  ?Patient ID: Amanda Bryant, female    DOB: [birth year],  MRN: [id number] ? ?Amanda Bryant presents to clinic today for for at risk foot care. Patient has h/o PAD and corn(s) left foot and painful thick toenails that are difficult to trim. Painful toenails interfere with ambulation. Aggravating factors include wearing enclosed shoe gear. Pain is relieved with periodic professional debridement. Painful corns are aggravated when weightbearing when wearing enclosed shoe gear. Pain is relieved with periodic professional debridement. ? ?New problem(s):  Patient states she worked in her yard on yesterday and now has a rash on both sides of her leg. She denies any encounter with poison ivy, etc. She notes slight swelling of the leg. Denies any fever, chills, night sweats, nausea or vomiting. ? ?PCP is Nicoletta Dress, MD , and last visit was August 03, 2021. ? ?Allergies  ?Allergen Reactions  ? Codeine Nausea Only  ? Prednisone Other (See Comments)  ?  Reaction: Increased heart rate  ? Ultram [Tramadol Hcl] Itching  ? Tape   ?  Pulls skin off   ? Prednisolone Acetate Palpitations  ? ? ?Review of Systems: Negative except as noted in the HPI. ?Objective:  ? ?Constitutional Amanda Bryant is a pleasant 81 y.o. Caucasian female, WD, WN in NAD. AAO x 3.   ?Vascular CFT <5 seconds b/l LE. Faintly palpable DP pulses b/l LE. Faintly palpable PT pulse(s) b/l LE. Pedal hair absent. No pain with calf compression b/l. Lower extremity skin temperature gradient WNL RLE. Increased warmth noted medial and lateral aspect of left lower leg with noted skin eruption. Trace edema noted left lower extremity. No cyanosis or clubbing noted b/l LE.  ?Neurologic Normal speech. Oriented to person, place, and time. Pt has subjective symptoms of neuropathy. Protective sensation intact 5/5 intact bilaterally with 10g monofilament b/l.  ?Dermatologic No open wounds b/l LE. No interdigital macerations noted b/l LE. Toenails 1-5 b/l  elongated, discolored, dystrophic, thickened, crumbly with subungual debris and tenderness to dorsal palpation. Skin eruption noted medial and lateral aspect of both feet resembling dermatitis secondary to . Porokeratotic lesion(s) submet head 4 left foot. No erythema, no edema, no drainage, no fluctuance.  ?Orthopedic: Muscle strength 5/5 to all lower extremity muscle groups bilaterally. Plantar fat pad atrophy of forefoot area b/l lower extremities.  ? ?Radiographs: None ? ?Last A1c: No flowsheet data found. ?  ?Assessment:  ? ?1. Pain due to onychomycosis of nail   ?2. Porokeratosis   ?3. Cellulitis of left lower extremity   ?4. PVD (peripheral vascular disease) ([place])   ? ?Plan:  ?Patient was evaluated and treated and all questions answered. ?Consent given for treatment as described below: ?-Examined patient. ?-Mycotic toenails 1-5 bilaterally were debrided in length and girth with sterile nail nippers and dremel without incident. ?-Porokeratotic lesion submet head 4 left foot pared and enucleated utilizing sterile scalpel blade without incident.   ?-Rx for Keflex 500 mg po, #30, to be taken one capsule TID for 10 days. Follow up with Dr. Cannon Kettle for re-evaluation in one week. ?-Patient/POA to call should there be question/concern in the interim. ? ?Return in about 3 months (around 03/01/2022). ? ?Marzetta Board, DPM ?

## 2021-12-11 ENCOUNTER — Other Ambulatory Visit: Payer: Self-pay | Admitting: Sports Medicine

## 2021-12-11 ENCOUNTER — Telehealth: Payer: Self-pay | Admitting: Sports Medicine

## 2021-12-11 DIAGNOSIS — M25561 Pain in right knee: Secondary | ICD-10-CM | POA: Diagnosis not present

## 2021-12-11 MED ORDER — CEPHALEXIN 500 MG PO CAPS: 500.0000 mg | ORAL_CAPSULE | Freq: Three times a day (TID) | ORAL | 0 refills | Status: DC

## 2021-12-11 NOTE — Telephone Encounter (Signed)
Pt called requesting refill on cephalexin 500 MG. Please advise.  ?

## 2021-12-11 NOTE — Progress Notes (Signed)
Refill Keflex until patient can be seen again in office for cellulitis ?

## 2021-12-19 DIAGNOSIS — J301 Allergic rhinitis due to pollen: Secondary | ICD-10-CM | POA: Diagnosis not present

## 2021-12-24 DIAGNOSIS — J019 Acute sinusitis, unspecified: Secondary | ICD-10-CM | POA: Diagnosis not present

## 2022-01-08 DIAGNOSIS — M25561 Pain in right knee: Secondary | ICD-10-CM | POA: Diagnosis not present

## 2022-01-28 DIAGNOSIS — M25561 Pain in right knee: Secondary | ICD-10-CM | POA: Diagnosis not present

## 2022-02-07 DIAGNOSIS — M25532 Pain in left wrist: Secondary | ICD-10-CM | POA: Diagnosis not present

## 2022-02-18 ENCOUNTER — Ambulatory Visit (INDEPENDENT_AMBULATORY_CARE_PROVIDER_SITE_OTHER): Payer: Medicare Other

## 2022-02-18 DIAGNOSIS — I442 Atrioventricular block, complete: Secondary | ICD-10-CM | POA: Diagnosis not present

## 2022-02-19 LAB — CUP PACEART REMOTE DEVICE CHECK
Battery Remaining Longevity: 47 mo
Battery Voltage: 2.98 V
Brady Statistic AP VP Percent: 0.69 %
Brady Statistic AP VS Percent: 0 %
Brady Statistic AS VP Percent: 98.01 %
Brady Statistic AS VS Percent: 1.3 %
Brady Statistic RA Percent Paced: 0.69 %
Brady Statistic RV Percent Paced: 98.41 %
Date Time Interrogation Session: 20230522093049
Implantable Lead Implant Date: 20081014
Implantable Lead Implant Date: 20081014
Implantable Lead Location: 753859
Implantable Lead Location: 753860
Implantable Lead Model: 5076
Implantable Lead Model: 5076
Implantable Pulse Generator Implant Date: 20170911
Lead Channel Impedance Value: 399 Ohm
Lead Channel Impedance Value: 437 Ohm
Lead Channel Impedance Value: 456 Ohm
Lead Channel Impedance Value: 589 Ohm
Lead Channel Pacing Threshold Amplitude: 0.375 V
Lead Channel Pacing Threshold Amplitude: 0.625 V
Lead Channel Pacing Threshold Pulse Width: 0.4 ms
Lead Channel Pacing Threshold Pulse Width: 0.4 ms
Lead Channel Sensing Intrinsic Amplitude: 1.125 mV
Lead Channel Sensing Intrinsic Amplitude: 1.125 mV
Lead Channel Sensing Intrinsic Amplitude: 14.125 mV
Lead Channel Sensing Intrinsic Amplitude: 14.125 mV
Lead Channel Setting Pacing Amplitude: 2 V
Lead Channel Setting Pacing Amplitude: 2.5 V
Lead Channel Setting Pacing Pulse Width: 0.4 ms
Lead Channel Setting Sensing Sensitivity: 5.6 mV

## 2022-03-06 NOTE — Progress Notes (Signed)
Remote pacemaker transmission.   

## 2022-03-07 ENCOUNTER — Ambulatory Visit: Payer: Medicare Other | Admitting: Podiatry

## 2022-03-07 DIAGNOSIS — I739 Peripheral vascular disease, unspecified: Secondary | ICD-10-CM | POA: Diagnosis not present

## 2022-03-07 DIAGNOSIS — L03115 Cellulitis of right lower limb: Secondary | ICD-10-CM | POA: Diagnosis not present

## 2022-03-07 DIAGNOSIS — L02415 Cutaneous abscess of right lower limb: Secondary | ICD-10-CM | POA: Diagnosis not present

## 2022-03-07 DIAGNOSIS — Q828 Other specified congenital malformations of skin: Secondary | ICD-10-CM | POA: Diagnosis not present

## 2022-03-07 MED ORDER — CEPHALEXIN 500 MG PO CAPS: 500.0000 mg | ORAL_CAPSULE | Freq: Three times a day (TID) | ORAL | 0 refills | Status: AC

## 2022-03-07 NOTE — Patient Instructions (Addendum)
Call your dentist and inform him you have a foot infection. He may want to reschedule your appointment.  Cellulitis, Adult  Cellulitis is a skin infection. The infected area is often warm, red, swollen, and sore. It occurs most often in the arms and lower legs. It is very important to get treated for this condition. What are the causes? This condition is caused by bacteria. The bacteria enter through a break in the skin, such as a cut, burn, insect bite, open sore, or crack. What increases the risk? This condition is more likely to occur in people who: Have a weak body defense system (immune system). Have open cuts, burns, bites, or scrapes on the skin. Are older than 81 years of age. Have a blood sugar problem (diabetes). Have a long-lasting (chronic) liver disease (cirrhosis) or kidney disease. Are very overweight (obese). Have a skin problem, such as: Itchy rash (eczema). Slow movement of blood in the veins (venous stasis). Fluid buildup below the skin (edema). Have been treated with high-energy rays (radiation). Use IV drugs. What are the signs or symptoms? Symptoms of this condition include: Skin that is: Red. Streaking. Spotting. Swollen. Sore or painful when you touch it. Warm. A fever. Chills. Blisters. How is this diagnosed? This condition is diagnosed based on: Medical history. Physical exam. Blood tests. Imaging tests. How is this treated? Treatment for this condition may include: Medicines to treat infections or allergies. Home care, such as: Rest. Placing cold or warm cloths (compresses) on the skin. Hospital care, if the condition is very bad. Follow these instructions at home: Medicines Take over-the-counter and prescription medicines only as told by your doctor. If you were prescribed an antibiotic medicine, take it as told by your doctor. Do not stop taking it even if you start to feel better. General instructions  Drink enough fluid to keep your  pee (urine) pale yellow. Do not touch or rub the infected area. Raise (elevate) the infected area above the level of your heart while you are sitting or lying down. Place cold or warm cloths on the area as told by your doctor. Keep all follow-up visits as told by your doctor. This is important. Contact a doctor if: You have a fever. You do not start to get better after 1-2 days of treatment. Your bone or joint under the infected area starts to hurt after the skin has healed. Your infection comes back. This can happen in the same area or another area. You have a swollen bump in the area. You have new symptoms. You feel ill and have muscle aches and pains. Get help right away if: Your symptoms get worse. You feel very sleepy. You throw up (vomit) or have watery poop (diarrhea) for a long time. You see red streaks coming from the area. Your red area gets larger. Your red area turns dark in color. These symptoms may represent a serious problem that is an emergency. Do not wait to see if the symptoms will go away. Get medical help right away. Call your local emergency services (911 in the [place] Do not drive yourself to the hospital. Summary Cellulitis is a skin infection. The area is often warm, red, swollen, and sore. This condition is treated with medicines, rest, and cold and warm cloths. Take all medicines only as told by your doctor. Tell your doctor if symptoms do not start to get better after 1-2 days of treatment. This information is not intended to replace advice given to you by your health  care provider. Make sure you discuss any questions you have with your health care provider. Document Revised: 06/28/2021 Document Reviewed: 06/28/2021 Elsevier Patient Education  [place].

## 2022-03-10 ENCOUNTER — Encounter: Payer: Self-pay | Admitting: Podiatry

## 2022-03-10 NOTE — Progress Notes (Signed)
  Subjective:  Patient ID: Amanda Bryant, female    DOB: [birth year],  MRN: [id number]  Amanda Bryant presents to clinic today for  redness and swelling for right foot; the right hallux and right 2nd digit. Patient states it started about 1 1/2 weeks ago. She has been  working out in the yard again. She denies any injuries to her foot.  She is accompanied by her husband on today's visit. Denies any fever, chills, night sweats, nausea or vomiting.  She states she has a dentist's appointment and is supposed to pick up one dose of antibiotics before her appointment.  PCP is Nicoletta Dress, MD.  Allergies  Allergen Reactions   Codeine Nausea Only   Prednisone Other (See Comments)    Reaction: Increased heart rate   Ultram [Tramadol Hcl] Itching   Tape     Pulls skin off    Prednisolone Acetate Palpitations    Review of Systems: Negative except as noted in the HPI.  Objective: No changes noted in today's physical examination. Constitutional Amanda Bryant is a pleasant 81 y.o. Caucasian female, WD, WN in NAD. AAO x 3.   Vascular CFT <5 seconds b/l LE. Faintly palpable DP pulses b/l LE. Faintly palpable PT pulse(s) b/l LE. Pedal hair absent. No pain with calf compression b/l. Lower extremity skin temperature gradient WNL RLE. Trace edema noted right forefoot. No cyanosis or clubbing noted b/l LE.  Neurologic Normal speech. Oriented to person, place, and time. Pt has subjective symptoms of neuropathy. Protective sensation intact 5/5 intact bilaterally with 10g monofilament b/l.  Dermatologic No open wounds b/l LE. No interdigital macerations noted b/l LE. Toenails 1-5 b/l adequate length.  Porokeratotic lesion(s) submet head 4 left foot. No erythema, no edema, no drainage, no fluctuance. Erythema noted right forefoot area with edema and slight warmth. No visible nidus for infection appreciated.  Orthopedic: Muscle strength 5/5 to all lower extremity muscle groups bilaterally. Plantar fat pad  atrophy of forefoot area b/l lower extremities.   Radiographs: None  Assessment/Plan: 1. Porokeratosis   2. Cellulitis and abscess of right lower extremity   3. PVD (peripheral vascular disease) ([place])     -Patient was evaluated and treated. All patient's and/or POA's questions/concerns answered on today's visit. -For cellulitis right foot, Rx sent for Keflex 500 mg po tid x 10 days. Follow up with Dr. Blenda Mounts in 10 days for re-evaluation. -Porokeratotic lesion(s) submet head 4 left foot pared and enucleated with sterile scalpel blade without incident. Total number of lesions debrided=1. -Advised patient to call her dentist and inform him she has a foot infection. He may want to reschedule her appointment. She related understanding. -Patient/POA to call should there be question/concern in the interim.   Return in about 10 days (around 03/17/2022).  Marzetta Board, DPM

## 2022-03-14 DIAGNOSIS — M25532 Pain in left wrist: Secondary | ICD-10-CM | POA: Diagnosis not present

## 2022-03-18 ENCOUNTER — Encounter: Payer: Self-pay | Admitting: Podiatry

## 2022-03-18 ENCOUNTER — Ambulatory Visit: Payer: Medicare Other | Admitting: Podiatry

## 2022-03-18 DIAGNOSIS — I739 Peripheral vascular disease, unspecified: Secondary | ICD-10-CM

## 2022-03-18 DIAGNOSIS — L02415 Cutaneous abscess of right lower limb: Secondary | ICD-10-CM | POA: Diagnosis not present

## 2022-03-18 DIAGNOSIS — L03115 Cellulitis of right lower limb: Secondary | ICD-10-CM

## 2022-03-18 MED ORDER — CEPHALEXIN 500 MG PO CAPS: 500.0000 mg | ORAL_CAPSULE | Freq: Four times a day (QID) | ORAL | 0 refills | Status: AC

## 2022-03-18 NOTE — Progress Notes (Signed)
  Subjective:  Patient ID: Amanda Bryant, female    DOB: 01/04/1941,  MRN: [id number]  Amanda Bryant presents to clinic today for folow-up of cellulitis in right great toe and leg. Relates it is doing better after one round of the keflex. Does relate she often needs two rounds of antibiotics to get rid of infections.   PCP is Nicoletta Dress, MD.  Allergies  Allergen Reactions   Codeine Nausea Only   Prednisone Other (See Comments)    Reaction: Increased heart rate   Ultram [Tramadol Hcl] Itching   Tape     Pulls skin off    Prednisolone Acetate Palpitations    Review of Systems: Negative except as noted in the HPI.  Objective: No changes noted in today's physical examination. Constitutional Amanda Bryant is a pleasant 81 y.o. Caucasian female, WD, WN in NAD. AAO x 3.   Vascular CFT <5 seconds b/l LE. Faintly palpable DP pulses b/l LE. Faintly palpable PT pulse(s) b/l LE. Pedal hair absent. No pain with calf compression b/l. Lower extremity skin temperature gradient WNL RLE. Trace edema noted right forefoot. No cyanosis or clubbing noted b/l LE.  Neurologic Normal speech. Oriented to person, place, and time. Pt has subjective symptoms of neuropathy. Protective sensation intact 5/5 intact bilaterally with 10g monofilament b/l.  Dermatologic No open wounds b/l LE. No interdigital macerations noted b/l LE. Toenails 1-5 b/l adequate length.  Porokeratotic lesion(s) submet head 4 left foot. No erythema, no edema, no drainage, no fluctuance. Erythema noted right forefoot area with edema and slight warmth improved.  No visible nidus for infection appreciated.  Orthopedic: Muscle strength 5/5 to all lower extremity muscle groups bilaterally. Plantar fat pad atrophy of forefoot area b/l lower extremities.   Radiographs: None  Assessment/Plan: 1. Cellulitis and abscess of right lower extremity   2. PVD (peripheral vascular disease) ([place])     -Patient was evaluated and treated. All  patient's and/or POA's questions/concerns answered on today's visit. -Keflex refilled x 10 more days  -Follow-up in 3 weeks for re-check.   No follow-ups on file.  Lorenda Peck, DPM

## 2022-03-25 DIAGNOSIS — Z01419 Encounter for gynecological examination (general) (routine) without abnormal findings: Secondary | ICD-10-CM | POA: Diagnosis not present

## 2022-03-25 DIAGNOSIS — Z6823 Body mass index (BMI) 23.0-23.9, adult: Secondary | ICD-10-CM | POA: Diagnosis not present

## 2022-03-25 DIAGNOSIS — N952 Postmenopausal atrophic vaginitis: Secondary | ICD-10-CM | POA: Diagnosis not present

## 2022-04-03 ENCOUNTER — Telehealth: Payer: Self-pay | Admitting: Podiatry

## 2022-04-03 DIAGNOSIS — H02831 Dermatochalasis of right upper eyelid: Secondary | ICD-10-CM | POA: Diagnosis not present

## 2022-04-03 DIAGNOSIS — H16223 Keratoconjunctivitis sicca, not specified as Sjogren's, bilateral: Secondary | ICD-10-CM | POA: Diagnosis not present

## 2022-04-03 DIAGNOSIS — H43811 Vitreous degeneration, right eye: Secondary | ICD-10-CM | POA: Diagnosis not present

## 2022-04-03 DIAGNOSIS — H02834 Dermatochalasis of left upper eyelid: Secondary | ICD-10-CM | POA: Diagnosis not present

## 2022-04-03 NOTE — Telephone Encounter (Signed)
Pt left message to cxl her appt on 7.6 with Dr Elisha Ponder because her husband is in the hospital and she cannot make it.   I checked and we have her scheduled to see Dr Blenda Mounts on 7.10. I left message for pt to call about appt.

## 2022-04-08 ENCOUNTER — Encounter: Payer: Self-pay | Admitting: Podiatry

## 2022-04-08 ENCOUNTER — Ambulatory Visit: Payer: Medicare Other | Admitting: Podiatry

## 2022-04-08 DIAGNOSIS — L03115 Cellulitis of right lower limb: Secondary | ICD-10-CM

## 2022-04-08 DIAGNOSIS — I739 Peripheral vascular disease, unspecified: Secondary | ICD-10-CM

## 2022-04-08 DIAGNOSIS — L02415 Cutaneous abscess of right lower limb: Secondary | ICD-10-CM | POA: Diagnosis not present

## 2022-04-08 MED ORDER — DOXYCYCLINE HYCLATE 100 MG PO TABS: 100.0000 mg | ORAL_TABLET | Freq: Two times a day (BID) | ORAL | 0 refills | Status: AC

## 2022-04-08 NOTE — Progress Notes (Signed)
  Subjective:  Patient ID: Amanda Bryant, female    DOB: 1941/04/22,  MRN: [id number]  Amanda Bryant presents to clinic today for folow-up of cellulitis in right great toe and leg. Relates not doing any better after a couple rounds of antibiotics. . Does relate she often needs two rounds of antibiotics to get rid of infections.   PCP is Nicoletta Dress, MD.  Allergies  Allergen Reactions   Codeine Nausea Only   Prednisone Other (See Comments)    Reaction: Increased heart rate   Ultram [Tramadol Hcl] Itching   Tape     Pulls skin off    Prednisolone Acetate Palpitations    Review of Systems: Negative except as noted in the HPI.  Objective: No changes noted in today's physical examination. Constitutional Amanda Bryant is a pleasant 81 y.o. Caucasian female, WD, WN in NAD. AAO x 3.   Vascular CFT <5 seconds b/l LE. Faintly palpable DP pulses b/l LE. Faintly palpable PT pulse(s) b/l LE. Pedal hair absent. No pain with calf compression b/l. Lower extremity skin temperature gradient WNL RLE. Trace edema noted right forefoot. No cyanosis or clubbing noted b/l LE.  Neurologic Normal speech. Oriented to person, place, and time. Pt has subjective symptoms of neuropathy. Protective sensation intact 5/5 intact bilaterally with 10g monofilament b/l.  Dermatologic No open wounds b/l LE. No interdigital macerations noted b/l LE. Toenails 1-5 b/l adequate length.  Porokeratotic lesion(s) submet head 4 left foot. No erythema, no edema, no drainage, no fluctuance. Erythema noted right forefoot area with edema and slight warmth improved.  No visible nidus for infection appreciated.  Orthopedic: Muscle strength 5/5 to all lower extremity muscle groups bilaterally. Plantar fat pad atrophy of forefoot area b/l lower extremities.   Radiographs: None  Assessment/Plan: No diagnosis found.   -Patient was evaluated and treated. All patient's and/or POA's questions/concerns answered on today's  visit. -Doxycycline x 7 days.  -ABIs ordered  -Advised to continue with warm compress -Follow-up in 4-5 weeks for re-check.   No follow-ups on file.  Lorenda Peck, DPM

## 2022-04-17 DIAGNOSIS — M79671 Pain in right foot: Secondary | ICD-10-CM | POA: Diagnosis not present

## 2022-04-17 DIAGNOSIS — L03115 Cellulitis of right lower limb: Secondary | ICD-10-CM | POA: Diagnosis not present

## 2022-04-18 DIAGNOSIS — M79671 Pain in right foot: Secondary | ICD-10-CM | POA: Diagnosis not present

## 2022-04-18 DIAGNOSIS — I739 Peripheral vascular disease, unspecified: Secondary | ICD-10-CM | POA: Diagnosis not present

## 2022-04-18 DIAGNOSIS — I776 Arteritis, unspecified: Secondary | ICD-10-CM | POA: Diagnosis not present

## 2022-04-25 ENCOUNTER — Telehealth: Payer: Self-pay | Admitting: *Deleted

## 2022-04-25 NOTE — Telephone Encounter (Signed)
Ok that is fine thank you

## 2022-04-25 NOTE — Telephone Encounter (Signed)
V&V([facility]) is calling to let physician know that patient is wanting to push appointment out for about 30 days. She did not specific reason. Spoke with patient and she is now ready to schedule Vas [place], will notify V&V.

## 2022-05-01 ENCOUNTER — Ambulatory Visit (HOSPITAL_COMMUNITY)
Admission: RE | Admit: 2022-05-01 | Discharge: 2022-05-01 | Disposition: A | Discharge status: Home / Self Care | Payer: Medicare Other | Source: Ambulatory Visit | Attending: Vascular Surgery | Admitting: Vascular Surgery

## 2022-05-01 DIAGNOSIS — I739 Peripheral vascular disease, unspecified: Secondary | ICD-10-CM | POA: Insufficient documentation

## 2022-05-01 DIAGNOSIS — L03115 Cellulitis of right lower limb: Secondary | ICD-10-CM

## 2022-05-01 DIAGNOSIS — L02415 Cutaneous abscess of right lower limb: Secondary | ICD-10-CM | POA: Diagnosis not present

## 2022-05-07 ENCOUNTER — Telehealth: Payer: Self-pay | Admitting: Podiatry

## 2022-05-07 NOTE — Telephone Encounter (Signed)
Patient called to follow up on her vein and vascular results.  Please advise

## 2022-05-16 DIAGNOSIS — M13841 Other specified arthritis, right hand: Secondary | ICD-10-CM | POA: Diagnosis not present

## 2022-05-16 DIAGNOSIS — M79644 Pain in right finger(s): Secondary | ICD-10-CM | POA: Diagnosis not present

## 2022-05-16 DIAGNOSIS — M13831 Other specified arthritis, right wrist: Secondary | ICD-10-CM | POA: Diagnosis not present

## 2022-05-20 ENCOUNTER — Ambulatory Visit (INDEPENDENT_AMBULATORY_CARE_PROVIDER_SITE_OTHER): Payer: Medicare Other

## 2022-05-20 DIAGNOSIS — I442 Atrioventricular block, complete: Secondary | ICD-10-CM

## 2022-05-21 ENCOUNTER — Telehealth: Payer: Self-pay

## 2022-05-21 DIAGNOSIS — M25561 Pain in right knee: Secondary | ICD-10-CM | POA: Diagnosis not present

## 2022-05-21 LAB — CUP PACEART REMOTE DEVICE CHECK
Battery Remaining Longevity: 42 mo
Battery Voltage: 2.97 V
Brady Statistic AP VP Percent: 0.42 %
Brady Statistic AP VS Percent: 0 %
Brady Statistic AS VP Percent: 98.58 %
Brady Statistic AS VS Percent: 1 %
Brady Statistic RA Percent Paced: 0.42 %
Brady Statistic RV Percent Paced: 98.76 %
Date Time Interrogation Session: 20230821093905
Implantable Lead Implant Date: 20081014
Implantable Lead Implant Date: 20081014
Implantable Lead Location: 753859
Implantable Lead Location: 753860
Implantable Lead Model: 5076
Implantable Lead Model: 5076
Implantable Pulse Generator Implant Date: 20170911
Lead Channel Impedance Value: 399 Ohm
Lead Channel Impedance Value: 437 Ohm
Lead Channel Impedance Value: 456 Ohm
Lead Channel Impedance Value: 589 Ohm
Lead Channel Pacing Threshold Amplitude: 0.5 V
Lead Channel Pacing Threshold Amplitude: 0.625 V
Lead Channel Pacing Threshold Pulse Width: 0.4 ms
Lead Channel Pacing Threshold Pulse Width: 0.4 ms
Lead Channel Sensing Intrinsic Amplitude: 1 mV
Lead Channel Sensing Intrinsic Amplitude: 1 mV
Lead Channel Sensing Intrinsic Amplitude: 13 mV
Lead Channel Sensing Intrinsic Amplitude: 13 mV
Lead Channel Setting Pacing Amplitude: 2 V
Lead Channel Setting Pacing Amplitude: 2.5 V
Lead Channel Setting Pacing Pulse Width: 0.4 ms
Lead Channel Setting Sensing Sensitivity: 5.6 mV

## 2022-05-21 NOTE — Telephone Encounter (Signed)
   Pre-operative Risk Assessment    Patient Name: Amanda Bryant  DOB: 1941-05-18 MRN: [id number]     Request for Surgical Clearance    Procedure:  Right Total Knee Replacement   Date of Surgery:  Clearance TBD                                 Surgeon:  Dr. Charlies Constable Surgeon's Group or Practice Name:  [facility]  Phone number:  [phone] Fax number:  [phone]   Type of Clearance Requested:   - Medical    Type of Anesthesia:  Spinal   Signed, [name]   05/21/2022, 5:29 PM

## 2022-05-22 ENCOUNTER — Ambulatory Visit: Payer: Medicare Other | Admitting: Gastroenterology

## 2022-05-22 ENCOUNTER — Encounter: Payer: Self-pay | Admitting: Gastroenterology

## 2022-05-22 ENCOUNTER — Other Ambulatory Visit (INDEPENDENT_AMBULATORY_CARE_PROVIDER_SITE_OTHER): Payer: Medicare Other

## 2022-05-22 VITALS — BP 124/70 | HR 61 | Ht 64.0 in | Wt 141.5 lb

## 2022-05-22 DIAGNOSIS — R1011 Right upper quadrant pain: Secondary | ICD-10-CM

## 2022-05-22 DIAGNOSIS — K449 Diaphragmatic hernia without obstruction or gangrene: Secondary | ICD-10-CM | POA: Diagnosis not present

## 2022-05-22 DIAGNOSIS — K219 Gastro-esophageal reflux disease without esophagitis: Secondary | ICD-10-CM

## 2022-05-22 LAB — CBC WITH DIFFERENTIAL/PLATELET
Basophils Absolute: 0.1 10*3/uL (ref 0.0–0.1)
Basophils Relative: 0.9 % (ref 0.0–3.0)
Eosinophils Absolute: 0.3 10*3/uL (ref 0.0–0.7)
Eosinophils Relative: 3.4 % (ref 0.0–5.0)
HCT: 36.4 % (ref 36.0–46.0)
Hemoglobin: 12 g/dL (ref 12.0–15.0)
Lymphocytes Relative: 29.5 % (ref 12.0–46.0)
Lymphs Abs: 2.2 10*3/uL (ref 0.7–4.0)
MCHC: 32.9 g/dL (ref 30.0–36.0)
MCV: 89.7 fl (ref 78.0–100.0)
Monocytes Absolute: 0.8 10*3/uL (ref 0.1–1.0)
Monocytes Relative: 10.9 % (ref 3.0–12.0)
Neutro Abs: 4.1 10*3/uL (ref 1.4–7.7)
Neutrophils Relative %: 55.3 % (ref 43.0–77.0)
Platelets: 157 10*3/uL (ref 150.0–400.0)
RBC: 4.06 Mil/uL (ref 3.87–5.11)
RDW: 13.3 % (ref 11.5–15.5)
WBC: 7.4 10*3/uL (ref 4.0–10.5)

## 2022-05-22 LAB — COMPREHENSIVE METABOLIC PANEL
ALT: 18 U/L (ref 0–35)
AST: 20 U/L (ref 0–37)
Albumin: 3.8 g/dL (ref 3.5–5.2)
Alkaline Phosphatase: 71 U/L (ref 39–117)
BUN: 19 mg/dL (ref 6–23)
CO2: 29 mEq/L (ref 19–32)
Calcium: 8.8 mg/dL (ref 8.4–10.5)
Chloride: 107 mEq/L (ref 96–112)
Creatinine, Ser: 0.56 mg/dL (ref 0.40–1.20)
GFR: 85.67 mL/min (ref >60.00)
Glucose, Bld: 92 mg/dL (ref 70–99)
Potassium: 4.2 mEq/L (ref 3.5–5.1)
Sodium: 139 mEq/L (ref 135–145)
Total Bilirubin: 0.4 mg/dL (ref 0.2–1.2)
Total Protein: 6.3 g/dL (ref 6.0–8.3)

## 2022-05-22 LAB — LIPASE: Lipase: 75 U/L — ABNORMAL HIGH (ref 11.0–59.0)

## 2022-05-22 MED ORDER — PROMETHAZINE HCL 25 MG PO TABS: 25.0000 mg | ORAL_TABLET | Freq: Three times a day (TID) | ORAL | 2 refills | Status: DC | PRN

## 2022-05-22 NOTE — Patient Instructions (Signed)
_______________________________________________________  If you are age 81 or older, your body mass index should be between 23-30. Your Body mass index is 24.29 kg/m. If this is out of the aforementioned range listed, please consider follow up with your Primary Care Provider.  If you are age 75 or younger, your body mass index should be between 19-25. Your Body mass index is 24.29 kg/m. If this is out of the aformentioned range listed, please consider follow up with your Primary Care Provider.   ________________________________________________________  The [facility] providers would like to encourage you to use [facility] to communicate with providers for non-urgent requests or questions.  Due to long hold times on the telephone, sending your provider a message by [facility] may be a faster and more efficient way to get a response.  Please allow 48 business hours for a response.  Please remember that this is for non-urgent requests.  _______________________________________________________  Your provider has requested that you go to the basement level for lab work before leaving today. Press "B" on the elevator. The lab is located at the first door on the left as you exit the elevator.  You have been scheduled for an abdominal ultrasound at [facility] (1st floor of hospital) on 05-27-2022 at 10am. Please arrive 15 minutes prior to your appointment for registration. Make certain not to have anything to eat or drink midnight prior to your appointment. Should you need to reschedule your appointment, please contact radiology at [phone]. This test typically takes about 30 minutes to perform.   Continue Omeprazole  We have sent the following medications to your pharmacy for you to pick up at your convenience: Phenergan  Thank you,  Dr. Jackquline Denmark

## 2022-05-22 NOTE — Telephone Encounter (Signed)
Patient is not on any blood thinning agent/therapy and no requests inquiry  Preoperative team, please contact this patient and set up a phone call appointment for further cardiac evaluation.  Thank you for your help.  Jossie Ng. Yesmin Mutch NP-C    05/22/2022, 12:55 PM [address] Office [phone] Fax [phone]

## 2022-05-22 NOTE — Progress Notes (Signed)
Chief Complaint: Abdominal pain  Referring Provider:  Nicoletta Dress, MD      ASSESSMENT AND PLAN;   #1. RUQ abdominal pain (positive carnett sign) with episodic nausea. Pt is s/p chole in past and neg ERCP as below.  H/O GERD on chronic omeprazole. Neg EGD 07/2019 for etiology. Neg CT 06/2019(Cannot do MRCP due to pacemaker). Neg GES 08/2019  #2. H/O Elevated alkaline phosphatase (resolved - normal alk phos at 63 05/2019). H/O cholecystectomy.  S/P ERCP 04/26/2015-Nl cholangiogram, sphincterotomy at [facility].  #3. GERD with small HH   Plan: -CBC, CMP, lipase -[place] abdo -Continue omeprazole 20 mg p.o. QD -Phenergan 25m po Q8hrs #30, 2RF fall precautions. -If still with problems, will perform further work-up.   HPI:    Amanda AITKENis a 81y.o. female  For follow-up visit  Still has "chronic right upper quadrant abdominal pain".  She describes occasional exacerbation which she describes as "attacks".  She has nausea but no vomiting.  No jaundice dark urine or pale stools.  No fever chills or night sweats.  Had normal solid-phase gastric emptying scan  Phenergan works well for nausea.  Could not identify any exacerbating factors or any fatty food intolerance.  Has been having regular bowel movements ever since she eats fig/day.  No weight loss or loss of appetite.  Past GI procedures: -Colonoscopy 07/2011 6 mm colon polyp s/p polypectomy, mild sigmoid diverticulosis, int Hoids Bx-hyperplastic. -EGD 07/01/2019 2 cm hiatal hernia, moderate gastritis, duodenal diverticulum. Bx - neg for celiac/HP.  Negative small bowel biopsies. -CT AP with contrast 06/2019: No acute abnormalities. ?thickening of the duodenum/jejunum, cholecystectomy with pneumobilia (prev ERCP) -GES 08/2019- Nl Past Medical History:  Diagnosis Date   Arthritis    Complete heart block (HCC)    intermittent, documented by prev ILR   HTN (hypertension)    Pacemaker    Implanted 2008   PVC's (premature  ventricular contractions)    S/P cardiac catheterization    2001- nonobstructive disease; nl lv function   Shingles     Past Surgical History:  Procedure Laterality Date   [place]    COLONOSCOPY  07/22/2011   Colonic Polyp, mild sigmoid diverticulosis, small internal hemorrhoids. Bx: hyperplastic polyps.   EP IMPLANTABLE DEVICE N/A 06/10/2016   Procedure: PPM Generator Changeout;  Surgeon: Will MMeredith Leeds MD;  [facility];  Service: Cardiovascular;  Laterality: N/A;   ERCP  04/26/2015   Normal cholangiogram- no hilar/righthepatic duct stricture visualized.    Family History  Problem Relation Age of Onset   Heart disease Mother    Diabetes Mother    Diabetes Father    Heart disease Father    Colon cancer Neg Hx    Esophageal cancer Neg Hx    Rectal cancer Neg Hx    Stomach cancer Neg Hx     Social History   Tobacco Use   Smoking status: Never   Smokeless tobacco: Never  Vaping Use   Vaping Use: Never used  Substance Use Topics   Alcohol use: No    Alcohol/week: 0.0 standard drinks of alcohol   Drug use: No    Current Outpatient Medications  Medication Sig Dispense Refill   acetaminophen (TYLENOL) 500 MG tablet Take 500 mg by mouth every 6 (six) hours as needed for mild pain.     albuterol (VENTOLIN HFA) 108 (  90 Base) MCG/ACT inhaler INHALE 2 PUFFS BY MOUTH 4 TIMES DAILY     alendronate (FOSAMAX) 70 MG tablet Take 70 mg by mouth every 7 (seven) days. Take with a full glass of water on an empty stomach.     amoxicillin (AMOXIL) 500 MG capsule SMARTSIG:4 Capsule(s) By Mouth Once     Calcium Carbonate (CALTRATE 600 PO) Take 1 tablet by mouth daily.      carvedilol (COREG) 6.25 MG tablet Take 6.25 mg by mouth 2 (two) times daily.     Cholecalciferol (VITAMIN D) 1000 UNITS capsule Take 1,000 Units by mouth daily.       ciclopirox (PENLAC) 8 % solution Apply topically.      fish oil-omega-3 fatty acids 1000 MG capsule Take 2 g by mouth daily.       losartan (COZAAR) 50 MG tablet Take 50 mg by mouth daily.     omeprazole (PRILOSEC) 20 MG capsule Take 20 mg by mouth daily.       promethazine (PHENERGAN) 25 MG tablet Take 25 mg by mouth.     RESTASIS 0.05 % ophthalmic emulsion 1 drop 2 (two) times daily.     rOPINIRole (REQUIP) 4 MG tablet Take 4 mg by mouth at bedtime.     simvastatin (ZOCOR) 20 MG tablet Take 20 mg by mouth daily.      traMADol (ULTRAM) 50 MG tablet Take 50 mg by mouth every 8 (eight) hours as needed.     venlafaxine (EFFEXOR-XR) 37.5 MG 24 hr capsule Take 37.5 mg by mouth daily.      Current Facility-Administered Medications  Medication Dose Route Frequency Provider Last Rate Last Admin   0.9 %  sodium chloride infusion  500 mL Intravenous Once Jackquline Denmark, MD       triamcinolone acetonide (KENALOG) 10 MG/ML injection 10 mg  10 mg Other Once Landis Martins, DPM       triamcinolone acetonide (KENALOG-40) injection 20 mg  20 mg Other Once Landis Martins, DPM        Allergies  Allergen Reactions   Codeine Nausea Only   Prednisone Other (See Comments)    Reaction: Increased heart rate   Ultram [Tramadol Hcl] Itching   Tape     Pulls skin off    Prednisolone Acetate Palpitations    Review of Systems:  neg     Physical Exam:    BP 124/70   Pulse 61   Ht _0  (1.626 m)   Wt 141 lb 8 oz (64.2 kg)   BMI 24.29 kg/m  Filed Weights   05/22/22 1059  Weight: 141 lb 8 oz (64.2 kg)   Gen: awake, alert, NAD HEENT: anicteric, no pallor CV: RRR, no mrg Pulm: CTA b/l Abd: soft, NT/ND, +BS throughout Ext: no c/c/e Neuro: nonfocal   Data Reviewed: I have personally reviewed following labs and imaging studies  CBC:    Latest Ref Rng & Units 03/05/2018   12:04 PM 06/04/2016    4:22 PM 01/14/2009    7:50 AM  CBC  WBC 4.0 - 10.5 K/uL 7.1  7.4    Hemoglobin 12.0 - 15.0 g/dL 13.1  13.2  11.9   Hematocrit 36.0 - 46.0 % 39.7  40.6   34.7   Platelets 150.0 - 400.0 K/uL 184.0  188      CMP:    Latest Ref Rng & Units 03/05/2018   12:04 PM 06/10/2016    6:39 AM 06/04/2016    4:22 PM  CMP  Glucose 70 - 99 mg/dL 104  103  86   BUN 6 - 23 mg/dL _0 Creatinine 0.40 - 1.20 mg/dL 0.58  0.81  0.80   Sodium 135 - 145 mEq/L 140  141  141   Potassium 3.5 - 5.1 mEq/L 5.3  4.2  4.5   Chloride 96 - 112 mEq/L 105  110  104   CO2 19 - 32 mEq/L _1 Calcium 8.4 - 10.5 mg/dL 9.6  9.2  9.3   Total Protein 6.0 - 8.3 g/dL 6.4     Total Bilirubin 0.2 - 1.2 mg/dL 0.4     Alkaline Phos 39 - 117 U/L 75     AST 0 - 37 U/L 35     ALT 0 - 35 U/L 30         Latest Ref Rng & Units 03/05/2018   12:04 PM 12/01/2007   11:10 AM  Hepatic Function  Total Protein 6.0 - 8.3 g/dL 6.4  6.5   Albumin 3.5 - 5.2 g/dL 4.0  3.6   AST 0 - 37 U/L 35  30   ALT 0 - 35 U/L 30  33   Alk Phosphatase 39 - 117 U/L 75  59   Total Bilirubin 0.2 - 1.2 mg/dL 0.4  0.6          Carmell Austria, MD 05/22/2022, 11:06 AM  Cc: Nicoletta Dress, MD

## 2022-05-24 NOTE — Telephone Encounter (Signed)
Tried to reach pt to set up televisit for pre op, though no answer or vm came on

## 2022-05-27 ENCOUNTER — Telehealth: Payer: Self-pay | Admitting: *Deleted

## 2022-05-27 ENCOUNTER — Ambulatory Visit (HOSPITAL_COMMUNITY)
Admission: RE | Admit: 2022-05-27 | Discharge: 2022-05-27 | Disposition: A | Discharge status: Home / Self Care | Payer: Medicare Other | Source: Ambulatory Visit | Attending: Gastroenterology | Admitting: Gastroenterology

## 2022-05-27 DIAGNOSIS — R1011 Right upper quadrant pain: Secondary | ICD-10-CM | POA: Insufficient documentation

## 2022-05-27 DIAGNOSIS — K7689 Other specified diseases of liver: Secondary | ICD-10-CM | POA: Diagnosis not present

## 2022-05-27 NOTE — Telephone Encounter (Signed)
Pt agreeable to plan of care for tele pre op appt 06/06/22 @ 9 am. Med rec and consent are done.     Patient Consent for Virtual Visit        Amanda Bryant has provided verbal consent on 05/27/2022 for a virtual visit (video or telephone).   CONSENT FOR VIRTUAL VISIT FOR:  Amanda Bryant  By participating in this virtual visit I agree to the following:  I hereby voluntarily request, consent and authorize [facility] and its employed or contracted physicians, physician assistants, nurse practitioners or other licensed health care professionals (the Practitioner), to provide me with telemedicine health care services (the "Services") as deemed necessary by the treating Practitioner. I acknowledge and consent to receive the Services by the Practitioner via telemedicine. I understand that the telemedicine visit will involve communicating with the Practitioner through live audiovisual communication technology and the disclosure of certain medical information by electronic transmission. I acknowledge that I have been given the opportunity to request an in-person assessment or other available alternative prior to the telemedicine visit and am voluntarily participating in the telemedicine visit.  I understand that I have the right to withhold or withdraw my consent to the use of telemedicine in the course of my care at any time, without affecting my right to future care or treatment, and that the Practitioner or I may terminate the telemedicine visit at any time. I understand that I have the right to inspect all information obtained and/or recorded in the course of the telemedicine visit and may receive copies of available information for a reasonable fee.  I understand that some of the potential risks of receiving the Services via telemedicine include:  Delay or interruption in medical evaluation due to technological equipment failure or disruption; Information transmitted may not be sufficient  (e.g. poor resolution of images) to allow for appropriate medical decision making by the Practitioner; and/or  In rare instances, security protocols could fail, causing a breach of personal health information.  Furthermore, I acknowledge that it is my responsibility to provide information about my medical history, conditions and care that is complete and accurate to the best of my ability. I acknowledge that Practitioner's advice, recommendations, and/or decision may be based on factors not within their control, such as incomplete or inaccurate data provided by me or distortions of diagnostic images or specimens that may result from electronic transmissions. I understand that the practice of medicine is not an exact science and that Practitioner makes no warranties or guarantees regarding treatment outcomes. I acknowledge that a copy of this consent can be made available to me via my patient portal ([facility]), or I can request a printed copy by calling the office of [place].    I understand that my insurance will be billed for this visit.   I have read or had this consent read to me. I understand the contents of this consent, which adequately explains the benefits and risks of the Services being provided via telemedicine.  I have been provided ample opportunity to ask questions regarding this consent and the Services and have had my questions answered to my satisfaction. I give my informed consent for the services to be provided through the use of telemedicine in my medical care

## 2022-05-27 NOTE — Telephone Encounter (Signed)
Pt agreeable to plan of care for tele pre op appt 06/06/22 @ 9 am. Med rec and consent are done.

## 2022-05-28 DIAGNOSIS — Z01818 Encounter for other preprocedural examination: Secondary | ICD-10-CM | POA: Diagnosis not present

## 2022-05-28 DIAGNOSIS — E785 Hyperlipidemia, unspecified: Secondary | ICD-10-CM | POA: Diagnosis not present

## 2022-05-28 DIAGNOSIS — E559 Vitamin D deficiency, unspecified: Secondary | ICD-10-CM | POA: Diagnosis not present

## 2022-05-28 DIAGNOSIS — I10 Essential (primary) hypertension: Secondary | ICD-10-CM | POA: Diagnosis not present

## 2022-05-28 DIAGNOSIS — R7301 Impaired fasting glucose: Secondary | ICD-10-CM | POA: Diagnosis not present

## 2022-05-31 ENCOUNTER — Other Ambulatory Visit (HOSPITAL_COMMUNITY): Payer: Self-pay

## 2022-05-31 ENCOUNTER — Telehealth: Payer: Self-pay

## 2022-05-31 NOTE — Telephone Encounter (Signed)
Patient Advocate Encounter   Received notification from [facility] that prior authorization is required for promethazine '25mg'$  tablets  Submitted: 05-31-2022 Key [id number] Status is pending

## 2022-06-04 NOTE — Telephone Encounter (Signed)
Received a fax regarding Prior Authorization from [employer] [facility]  for PROMETHAZINE TABS. Authorization has been DENIED because criteria was not meet.

## 2022-06-05 ENCOUNTER — Other Ambulatory Visit (HOSPITAL_COMMUNITY): Payer: Self-pay

## 2022-06-05 NOTE — Progress Notes (Signed)
Virtual Visit via Telephone Note   Because of Amanda Bryant's co-morbid illnesses, she is at least at moderate risk for complications without adequate follow up.  This format is felt to be most appropriate for this patient at this time.  The patient did not have access to video technology/had technical difficulties with video requiring transitioning to audio format only (telephone).  All issues noted in this document were discussed and addressed.  No physical exam could be performed with this format.  Please refer to the patient's chart for her consent to telehealth for [facility].  Evaluation Performed:  Preoperative cardiovascular risk assessment _____________   Date:  06/05/2022   Patient ID:  Amanda Bryant, DOB 1941-09-17, MRN [id number] Patient Location:  Home Provider location:   Office  Primary Care Provider:  Nicoletta Dress, MD Primary Cardiologist:  None  Chief Complaint / Patient Profile   81 y.o. y/o female with a h/o intermittent complete heart block s/p PPM implant 2008 with GEN change 05/2016, HTN, cardiac catheterization with non-obstructive CAD 2001 who is pending right total knee replacement and presents today for telephonic preoperative cardiovascular risk assessment.  Past Medical History    Past Medical History:  Diagnosis Date   Arthritis    Complete heart block (HCC)    intermittent, documented by prev ILR   HTN (hypertension)    Pacemaker    Implanted 2008   PVC's (premature ventricular contractions)    S/P cardiac catheterization    2001- nonobstructive disease; nl lv function   Shingles    Past Surgical History:  Procedure Laterality Date   BACK SURGERY  2010   [place]     COLONOSCOPY  07/22/2011   Colonic Polyp, mild sigmoid diverticulosis, small internal hemorrhoids. Bx: hyperplastic polyps.   EP IMPLANTABLE DEVICE N/A 06/10/2016   Procedure: PPM Generator Changeout;   Surgeon: Will Meredith Leeds, MD;  Location: [facility];  Service: Cardiovascular;  Laterality: N/A;   ERCP  04/26/2015   Normal cholangiogram- no hilar/righthepatic duct stricture visualized.    Allergies  Allergies  Allergen Reactions   Codeine Nausea Only   Prednisone Other (See Comments)    Reaction: Increased heart rate   Ultram [Tramadol Hcl] Itching   Tape     Pulls skin off    Prednisolone Acetate Palpitations    History of Present Illness    Amanda Bryant is a 81 y.o. female who presents via audio/video conferencing for a telehealth visit today.  Pt was last seen in cardiology clinic on 12/03/21 by Dr. Caryl Comes.  At that time JALINE PINCOCK was doing well.  The patient is now pending procedure as outlined above. Since her last visit, she denies chest pain, shortness of breath, lower extremity edema, fatigue, palpitations, melena, hematuria, hemoptysis, diaphoresis, weakness, presyncope, syncope, orthopnea, and PND.   Home Medications    Prior to Admission medications   Medication Sig Start Date End Date Taking? Authorizing Provider  acetaminophen (TYLENOL) 500 MG tablet Take 500 mg by mouth every 6 (six) hours as needed for mild pain.    [provider]  albuterol (VENTOLIN HFA) 108 (90 Base) MCG/ACT inhaler INHALE 2 PUFFS BY MOUTH 4 TIMES DAILY 11/30/19   [provider]  alendronate (FOSAMAX) 70 MG tablet Take 70 mg by mouth every 7 (seven) days. Take with a full glass of water on an empty stomach.  [provider]  amoxicillin (AMOXIL) 500 MG capsule SMARTSIG:4 Capsule(s) By Mouth Once 03/05/22   [provider]  Calcium Carbonate (CALTRATE 600 PO) Take 1 tablet by mouth daily.     [provider]  carvedilol (COREG) 6.25 MG tablet Take 6.25 mg by mouth 2 (two) times daily.    [provider]  Cholecalciferol (VITAMIN D) 1000 UNITS capsule Take 1,000 Units by mouth daily.      [provider]  ciclopirox  (PENLAC) 8 % solution Apply topically. 07/18/20   [provider]  fish oil-omega-3 fatty acids 1000 MG capsule Take 2 g by mouth daily.      [provider]  losartan (COZAAR) 50 MG tablet Take 50 mg by mouth daily.    [provider]  omeprazole (PRILOSEC) 20 MG capsule Take 20 mg by mouth daily.      [provider]  promethazine (PHENERGAN) 25 MG tablet Take 1 tablet (25 mg total) by mouth every 8 (eight) hours as needed for nausea or vomiting. 05/22/22   Jackquline Denmark, MD  RESTASIS 0.05 % ophthalmic emulsion 1 drop 2 (two) times daily. 05/22/20   [provider]  rOPINIRole (REQUIP) 4 MG tablet Take 4 mg by mouth at bedtime. 05/20/19   [provider]  simvastatin (ZOCOR) 20 MG tablet Take 20 mg by mouth daily.  04/09/19   [provider]  traMADol (ULTRAM) 50 MG tablet Take 50 mg by mouth every 8 (eight) hours as needed. 02/07/22   [provider]  venlafaxine (EFFEXOR-XR) 37.5 MG 24 hr capsule Take 37.5 mg by mouth daily.     [provider]    Physical Exam    Vital Signs:  LINNIE DELGRANDE does not have vital signs available for review today.  Given telephonic nature of communication, physical exam is limited. AAOx3. NAD. Normal affect.  Speech and respirations are unlabored.  Accessory Clinical Findings    None  Assessment & Plan    1.  Preoperative Cardiovascular Risk Assessment: The patient is doing well from a cardiac perspective. Therefore, based on ACC/AHA guidelines, the patient would be at acceptable risk for the planned procedure without further cardiovascular testing. The patient was advised that if he develops new symptoms prior to surgery to contact our office to arrange for a follow-up visit, and he verbalized understanding. According to the Revised Cardiac Risk Index (RCRI), her Perioperative Risk of Major Cardiac Event is (%): 0.9. Her Functional Capacity in METs is: 7.59 according to the [facility]  Activity Status Index (DASI).  No requests for medication holds  A copy of this note will be routed to requesting surgeon.  Time:   Today, I have spent 8 minutes with the patient with telehealth technology discussing medical history, symptoms, and management plan.     Emmaline Life, NP-C     06/05/2022, 9:33 AM [address], [address] Office [phone] Fax [phone]

## 2022-06-06 ENCOUNTER — Other Ambulatory Visit (HOSPITAL_COMMUNITY): Payer: Self-pay

## 2022-06-06 ENCOUNTER — Ambulatory Visit: Payer: Medicare Other | Attending: Interventional Cardiology | Admitting: Nurse Practitioner

## 2022-06-06 ENCOUNTER — Encounter: Payer: Self-pay | Admitting: Nurse Practitioner

## 2022-06-06 DIAGNOSIS — Z01810 Encounter for preprocedural cardiovascular examination: Secondary | ICD-10-CM | POA: Diagnosis not present

## 2022-06-14 ENCOUNTER — Other Ambulatory Visit (HOSPITAL_COMMUNITY): Payer: Self-pay

## 2022-06-17 DIAGNOSIS — R600 Localized edema: Secondary | ICD-10-CM | POA: Diagnosis not present

## 2022-06-17 NOTE — Progress Notes (Signed)
Remote pacemaker transmission.   

## 2022-06-24 ENCOUNTER — Other Ambulatory Visit (HOSPITAL_COMMUNITY): Payer: Self-pay

## 2022-06-26 DIAGNOSIS — M25561 Pain in right knee: Secondary | ICD-10-CM | POA: Diagnosis not present

## 2022-06-26 NOTE — H&P (Signed)
KNEE ARTHROPLASTY ADMISSION H&P  Patient ID: DEWEY NEUKAM MRN: [id number] DOB/AGE: [birth year] 81 y.o.  Chief Complaint: right knee pain.  Planned Procedure Date: 07/15/22 Medical Clearance by Dr. Nelda Bucks   Cardiac Clearance by Dr. Virl Axe   HPI: Amanda Bryant is a 81 y.o. 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 The patient has a history of pain and functional disability in the right knee due to arthritis and has failed non-surgical conservative treatments for greater than 12 weeks to include NSAID's and/or analgesics, corticosteriod injections, use of assistive devices, and activity modification.  Onset of symptoms was gradual, starting 2 years ago with gradually worsening course since that time. The patient noted no past surgery on the right knee.  Patient currently rates pain at 8 out of 10 with activity. Patient has night pain, worsening of pain with activity and weight bearing, and pain that interferes with activities of daily living.  Patient has evidence of subchondral sclerosis, periarticular osteophytes, and joint space narrowing by imaging studies.  There is no active infection.  Past Medical History:  Diagnosis Date   Arthritis    Complete heart block (HCC)    intermittent, documented by prev ILR   HTN (hypertension)    Pacemaker    Implanted 2008   PVC's (premature ventricular contractions)    S/P cardiac catheterization    2001- nonobstructive disease; nl lv function   Shingles    Past Surgical History:  Procedure Laterality Date   BACK SURGERY  2010   [place]     COLONOSCOPY  07/22/2011   Colonic Polyp, mild sigmoid diverticulosis, small internal hemorrhoids. Bx: hyperplastic polyps.   EP IMPLANTABLE DEVICE N/A 06/10/2016   Procedure: PPM Generator Changeout;  Surgeon: Will Meredith Leeds, MD;  Location: [facility];  Service: Cardiovascular;  Laterality: N/A;   ERCP   04/26/2015   Normal cholangiogram- no hilar/righthepatic duct stricture visualized.   Allergies  Allergen Reactions   Codeine Nausea Only   Prednisone Other (See Comments)    Reaction: Increased heart rate   Ultram [Tramadol Hcl] Itching   Tape     Pulls skin off    Prednisolone Acetate Palpitations   Prior to Admission medications   Medication Sig Start Date End Date Taking? Authorizing Provider  acetaminophen (TYLENOL) 500 MG tablet Take 500 mg by mouth every 6 (six) hours as needed for mild pain.    [provider]  albuterol (VENTOLIN HFA) 108 (90 Base) MCG/ACT inhaler INHALE 2 PUFFS BY MOUTH 4 TIMES DAILY 11/30/19   [provider]  alendronate (FOSAMAX) 70 MG tablet Take 70 mg by mouth every 7 (seven) days. Take with a full glass of water on an empty stomach.    [provider]  amoxicillin (AMOXIL) 500 MG capsule SMARTSIG:4 Capsule(s) By Mouth Once 03/05/22   [provider]  Calcium Carbonate (CALTRATE 600 PO) Take 1 tablet by mouth daily.     [provider]  carvedilol (COREG) 6.25 MG tablet Take 6.25 mg by mouth 2 (two) times daily.    [provider]  Cholecalciferol (VITAMIN D) 1000 UNITS capsule Take 1,000 Units by mouth daily.      [provider]  ciclopirox (PENLAC) 8 % solution Apply topically. 07/18/20   [provider]  fish oil-omega-3 fatty acids 1000 MG capsule Take 2 g by mouth daily.      [provider]  losartan (COZAAR) 50 MG tablet Take 50 mg by mouth daily.    [provider]  omeprazole (PRILOSEC) 20 MG capsule Take 20 mg by mouth daily.      [provider]  promethazine (PHENERGAN) 25 MG tablet Take 1 tablet (25 mg total) by mouth every 8 (eight) hours as needed for nausea or vomiting. 05/22/22   Jackquline Denmark, MD  RESTASIS 0.05 % ophthalmic emulsion 1 drop 2 (two) times daily. 05/22/20   [provider]  rOPINIRole (REQUIP) 4 MG tablet Take 4 mg by mouth  at bedtime. 05/20/19   [provider]  simvastatin (ZOCOR) 20 MG tablet Take 20 mg by mouth daily.  04/09/19   [provider]  traMADol (ULTRAM) 50 MG tablet Take 50 mg by mouth every 8 (eight) hours as needed. 02/07/22   [provider]  venlafaxine (EFFEXOR-XR) 37.5 MG 24 hr capsule Take 37.5 mg by mouth daily.     [provider]   Social History   Socioeconomic History   Marital status: Married    Spouse name: Not on file   Number of children: Not on file   Years of education: Not on file   Highest education level: Not on file  Occupational History   Not on file  Tobacco Use   Smoking status: Never   Smokeless tobacco: Never  Vaping Use   Vaping Use: Never used  Substance and Sexual Activity   Alcohol use: No    Alcohol/week: 0.0 standard drinks of alcohol   Drug use: No   Sexual activity: Not on file  Other Topics Concern   Not on file  Social History Narrative   Not on file   Social Determinants of Health   Financial Resource Strain: Not on file  Food Insecurity: Not on file  Transportation Needs: Not on file  Physical Activity: Not on file  Stress: Not on file  Social Connections: Not on file   Family History  Problem Relation Age of Onset   Heart disease Mother    Diabetes Mother    Diabetes Father    Heart disease Father    Colon cancer Neg Hx    Esophageal cancer Neg Hx    Rectal cancer Neg Hx    Stomach cancer Neg Hx     ROS: Currently denies lightheadedness, dizziness, Fever, chills, CP, SOB.   No personal history of DVT, PE, MI, or CVA. + pacemaker insertion 2008. No loose teeth. + partial dentures. All other systems have been reviewed and were otherwise currently negative with the exception of those mentioned in the HPI and as above.  Objective: Vitals: Ht: 5'6" Wt: 140.6 lbs Temp: 97.6 BP: 135/88 Pulse: 64 O2 96% on room air.   Physical Exam: General: Alert, NAD.  Antalgic Gait  HEENT: EOMI, Good Neck  Extension  Pulm: No increased work of breathing.  Clear B/L A/P w/o crackle or wheeze.  CV: RRR, No m/g/r appreciated  GI: soft, NT, ND. BS x 4 quadrants Neuro: CN II-XII grossly intact without focal deficit.  Sensation intact distally Skin: No lesions in the area of chief complaint MSK/Surgical Site:  + JLT. ROM 10-95 degrees.  Decreased strength in extension and flexion.  +EHL/FHL.  NVI.  Pain and instability with varus and valgus stress.    Imaging Review Plain radiographs demonstrate moderate degenerative joint disease of the right knee.   The overall alignment ismild valgus. The bone quality appears to be fair for age  and reported activity level.  Preoperative templating of the joint replacement has been completed, documented, and submitted to the Operating Room personnel in order to optimize intra-operative equipment management.  Assessment: OA RIGHT KNEE Active Problems:   * No active hospital problems. *   Plan: Plan for Procedure(s): TOTAL KNEE ARTHROPLASTY  The patient history, physical exam, clinical judgement of the provider and imaging are consistent with end stage degenerative joint disease and total joint arthroplasty is deemed medically necessary. The treatment options including medical management, injection therapy, and arthroplasty were discussed at length. The risks and benefits of Procedure(s): TOTAL KNEE ARTHROPLASTY were presented and reviewed.  The risks of nonoperative treatment, versus surgical intervention including but not limited to continued pain, aseptic loosening, stiffness, dislocation/subluxation, infection, bleeding, nerve injury, blood clots, cardiopulmonary complications, morbidity, mortality, among others were discussed. The patient verbalizes understanding and wishes to proceed with the plan.  Patient is being admitted for inpatient treatment for surgery, pain control, PT, prophylactic antibiotics, VTE prophylaxis, progressive ambulation, ADL's and  discharge planning. She will spend at least 1 night in the hospital.   Dental prophylaxis discussed and recommended for 2 years postoperatively.  The patient does meet the criteria for TXA which will be used perioperatively.   ASA 81 mg BID will be used postoperatively for DVT prophylaxis in addition to SCDs, and early ambulation. Plan for Tylenol, Celebrex, Oxycodone for pain.   Zofran for nausea and vomiting. Colace for constipation prevention Pharmacy- [facility] on [address] in [place] The patient is planning to be discharged home with HHPT (Marty) and into the care of her husband Braulio Conte who can be reached at [phone] and her son Elta Guadeloupe who can be reached at [phone]. Follow up appt 07/30/22 at 2:45pm     Alisa Graff Office [phone] 06/26/2022 6:23 PM

## 2022-07-01 NOTE — Patient Instructions (Signed)
SURGICAL WAITING ROOM VISITATION Patients having surgery or a procedure may have no more than 2 support people in the waiting area - these visitors may rotate.   Children under the age of 62 must have an adult with them who is not the patient. If the patient needs to stay at the hospital during part of their recovery, the visitor guidelines for inpatient rooms apply. Pre-op nurse will coordinate an appropriate time for 1 support person to accompany patient in pre-op.  This support person may not rotate.    Please refer to the [facility] website for the visitor guidelines for Inpatients (after your surgery is over and you are in a regular room).       Your procedure is scheduled on:  07/15/2022    Report to [facility] Main Entrance    Report to admitting at   0750AM   Call this number if you have problems the morning of surgery [phone]   Do not eat food :After Midnight.   After Midnight you may have the following liquids until _____ 0715_ AM  DAY OF SURGERY  Water Non-Citrus Juices (without pulp, NO RED) Carbonated Beverages Black Coffee (NO MILK/CREAM OR CREAMERS, sugar ok)  Clear Tea (NO MILK/CREAM OR CREAMERS, sugar ok) regular and decaf                             Plain Jell-O (NO RED)                                           Fruit ices (not with fruit pulp, NO RED)                                     Popsicles (NO RED)                                                               Sports drinks like Gatorade (NO RED)        The day of surgery:  Drink ONE (1) Pre-Surgery Clear Ensure or G2 at  0715 AM  ( have completed by ) the morning of surgery. Drink in one sitting. Do not sip.  This drink was given to you during your hospital  pre-op appointment visit. Nothing else to drink after completing the  Pre-Surgery Clear Ensure or G2.          If you have questions, please contact your surgeon's office.     Oral Hygiene is also important to reduce your risk  of infection.                                    Remember - BRUSH YOUR TEETH THE MORNING OF SURGERY WITH YOUR REGULAR TOOTHPASTE   Do NOT smoke after Midnight   Take these medicines the morning of surgery with A SIP OF WATER:  inhalers as usual and bring, coreg, omeprazole, effexor   DO NOT TAKE ANY ORAL DIABETIC MEDICATIONS DAY OF  YOUR SURGERY  Bring CPAP mask and tubing day of surgery.                              You may not have any metal on your body including hair pins, jewelry, and body piercing             Do not wear make-up, lotions, powders, perfumes/cologne, or deodorant  Do not wear nail polish including gel and S&S, artificial/acrylic nails, or any other type of covering on natural nails including finger and toenails. If you have artificial nails, gel coating, etc. that needs to be removed by a nail salon please have this removed prior to surgery or surgery may need to be canceled/ delayed if the surgeon/ anesthesia feels like they are unable to be safely monitored.   Do not shave  48 hours prior to surgery.               Men may shave face and neck.   Do not bring valuables to the hospital. [place].   Contacts, dentures or bridgework may not be worn into surgery.   Bring small overnight bag day of surgery.   DO NOT [place]. PHARMACY WILL DISPENSE MEDICATIONS LISTED ON YOUR MEDICATION LIST TO YOU DURING YOUR ADMISSION [place]!    Patients discharged on the day of surgery will not be allowed to drive home.  Someone NEEDS to stay with you for the first 24 hours after anesthesia.   Special Instructions: Bring a copy of your healthcare power of attorney and living will documents the day of surgery if you haven't scanned them before.              Please read over the following fact sheets you were given: IF [place]  [phone]   If you received a COVID test during your pre-op visit  it is requested that you wear a mask when out in public, stay away from anyone that may not be feeling well and notify your surgeon if you develop symptoms. If you test positive for Covid or have been in contact with anyone that has tested positive in the last 10 days please notify you surgeon.     [place] - Preparing for Surgery Before surgery, you can play an important role.  Because skin is not sterile, your skin needs to be as free of germs as possible.  You can reduce the number of germs on your skin by washing with CHG (chlorahexidine gluconate) soap before surgery.  CHG is an antiseptic cleaner which kills germs and bonds with the skin to continue killing germs even after washing. Please DO NOT use if you have an allergy to CHG or antibacterial soaps.  If your skin becomes reddened/irritated stop using the CHG and inform your nurse when you arrive at Short Stay. Do not shave (including legs and underarms) for at least 48 hours prior to the first CHG shower.  You may shave your face/neck. Please follow these instructions carefully:  1.  Shower with CHG Soap the night before surgery and the  morning of Surgery.  2.  If you choose to wash your hair, wash your hair first as usual with your  normal  shampoo.  3.  After you shampoo, rinse your hair and body thoroughly to remove the  shampoo.                           4.  Use CHG as you would any other liquid soap.  You can apply chg directly  to the skin and wash                       Gently with a scrungie or clean washcloth.  5.  Apply the CHG Soap to your body ONLY FROM THE NECK DOWN.   Do not use on face/ open                           Wound or open sores. Avoid contact with eyes, ears mouth and genitals (private parts).                       Wash face,  Genitals (private parts) with your normal soap.             6.  Wash thoroughly, paying special attention to the area  where your surgery  will be performed.  7.  Thoroughly rinse your body with warm water from the neck down.  8.  DO NOT shower/wash with your normal soap after using and rinsing off  the CHG Soap.                9.  Pat yourself dry with a clean towel.            10.  Wear clean pajamas.            11.  Place clean sheets on your bed the night of your first shower and do not  sleep with pets. Day of Surgery : Do not apply any lotions/deodorants the morning of surgery.  Please wear clean clothes to the hospital/surgery center.  FAILURE TO FOLLOW THESE INSTRUCTIONS MAY RESULT IN THE CANCELLATION OF YOUR SURGERY PATIENT SIGNATURE_________________________________  NURSE SIGNATURE__________________________________  ________________________________________________________________________

## 2022-07-01 NOTE — Progress Notes (Addendum)
Anesthesia Review:  PCP: Amanda Bryant- 05/29/22 Clearance on chart  LOV 05/28/22 on chasrt  Cardiologist : Amanda Bryant- LOv 06/06/22 preop eval   Chest x-ray : 12/04/21- 2 view  EKG : 12/03/21  Pacemaker - ORders requestesd on 07/03/22  ORders on front of chart and in epic.  05/21/22- device check  Echo : Stress test: Cardiac Cath :  Activity level: can do a flight of stairs without difficutly  Sleep Study/ CPAP : none  Fasting Blood Sugar :      / Checks Blood Sugar -- times a day:   Blood Thinner/ Instructions /Last Dose: ASA / Instructions/ Last Dose :

## 2022-07-03 ENCOUNTER — Other Ambulatory Visit: Payer: Self-pay

## 2022-07-03 ENCOUNTER — Other Ambulatory Visit (HOSPITAL_COMMUNITY): Payer: Self-pay

## 2022-07-03 ENCOUNTER — Encounter (HOSPITAL_COMMUNITY)
Admission: RE | Admit: 2022-07-03 | Discharge: 2022-07-03 | Disposition: A | Discharge status: Home / Self Care | Payer: Medicare Other | Source: Ambulatory Visit | Attending: Orthopedic Surgery | Admitting: Orthopedic Surgery

## 2022-07-03 ENCOUNTER — Encounter (HOSPITAL_COMMUNITY): Payer: Self-pay

## 2022-07-03 ENCOUNTER — Encounter: Payer: Self-pay | Admitting: Internal Medicine

## 2022-07-03 VITALS — BP 151/92 | HR 63 | Temp 98.1°F | Resp 16 | Ht 66.0 in | Wt 140.0 lb

## 2022-07-03 DIAGNOSIS — E785 Hyperlipidemia, unspecified: Secondary | ICD-10-CM | POA: Diagnosis not present

## 2022-07-03 DIAGNOSIS — I10 Essential (primary) hypertension: Secondary | ICD-10-CM | POA: Diagnosis not present

## 2022-07-03 DIAGNOSIS — Z950 Presence of cardiac pacemaker: Secondary | ICD-10-CM | POA: Diagnosis not present

## 2022-07-03 DIAGNOSIS — Z01812 Encounter for preprocedural laboratory examination: Secondary | ICD-10-CM | POA: Diagnosis not present

## 2022-07-03 DIAGNOSIS — Z0000 Encounter for general adult medical examination without abnormal findings: Secondary | ICD-10-CM | POA: Diagnosis not present

## 2022-07-03 DIAGNOSIS — M1711 Unilateral primary osteoarthritis, right knee: Secondary | ICD-10-CM | POA: Insufficient documentation

## 2022-07-03 DIAGNOSIS — Z01818 Encounter for other preprocedural examination: Secondary | ICD-10-CM

## 2022-07-03 DIAGNOSIS — Z1331 Encounter for screening for depression: Secondary | ICD-10-CM | POA: Diagnosis not present

## 2022-07-03 HISTORY — DX: Gastro-esophageal reflux disease without esophagitis: K21.9

## 2022-07-03 HISTORY — DX: Other specified postprocedural states: Z98.890

## 2022-07-03 LAB — CBC
HCT: 40.4 % (ref 36.0–46.0)
Hemoglobin: 12.7 g/dL (ref 12.0–15.0)
MCH: 28.7 pg (ref 26.0–34.0)
MCHC: 31.4 g/dL (ref 30.0–36.0)
MCV: 91.4 fL (ref 80.0–100.0)
Platelets: 184 10*3/uL (ref 150–400)
RBC: 4.42 MIL/uL (ref 3.87–5.11)
RDW: 12.6 % (ref 11.5–15.5)
WBC: 8.6 10*3/uL (ref 4.0–10.5)
nRBC: 0 % (ref 0.0–0.2)

## 2022-07-03 LAB — BASIC METABOLIC PANEL
Anion gap: 7 (ref 5–15)
BUN: 18 mg/dL (ref 8–23)
CO2: 27 mmol/L (ref 22–32)
Calcium: 9.1 mg/dL (ref 8.9–10.3)
Chloride: 106 mmol/L (ref 98–111)
Creatinine, Ser: 0.49 mg/dL (ref 0.44–1.00)
GFR, Estimated: >60 mL/min (ref >60)
Glucose, Bld: 105 mg/dL — ABNORMAL HIGH (ref 70–99)
Potassium: 4.6 mmol/L (ref 3.5–5.1)
Sodium: 140 mmol/L (ref 135–145)

## 2022-07-03 LAB — SURGICAL PCR SCREEN
MRSA, PCR: NEGATIVE
Staphylococcus aureus: POSITIVE — AB

## 2022-07-03 NOTE — Progress Notes (Signed)
[facility] DEVICE PROGRAMMING  Patient Information: Name:  [name]  DOB:  31-Mar-1941  MRN:  [id number]    Planned Procedure:  right total knee replacement  Surgeon:  Dr  Charlies Constable  Date of Procedure:  07/15/2022  Cautery will be used.  Position during surgery:   Please send documentation back to:  [facility] (Fax # [phone])  Device Information:  Clinic EP Physician:  Virl Axe, MD   Device Type:  Pacemaker Manufacturer and Phone #:  [employer]tronic: [id number] Pacemaker Dependent?:  Yes.   Date of Last Device Check:  05/20/2022 Normal Device Function?:  Yes.    Electrophysiologist's Recommendations:  Have magnet available. Provide continuous ECG monitoring when magnet is used or reprogramming is to be performed.  Procedure should not interfere with device function.  No device programming or magnet placement needed.  Per Device Clinic Standing Orders, Simone Curia, RN  10:47 AM 07/03/2022

## 2022-07-04 NOTE — Progress Notes (Signed)
Anesthesia Chart Review   Case: [id number] Date/Time: 07/15/22 1006   Procedure: TOTAL KNEE ARTHROPLASTY (Right: Knee)   Anesthesia type: Spinal   Pre-op diagnosis: OA RIGHT KNEE   Location: [facility] ROOM 08 / [facility]   Surgeons: Willaim Sheng, MD       DISCUSSION:81 y.o. never smoker with h/o HTN, CHB with pacemaker in place (device orders in 07/03/2022 progress note), right knee OA scheduled for above procedure 07/15/2022 with Dr. Charlies Constable.   Pt lats seen by cardiology 06/06/2022. Per OV note, "Preoperative Cardiovascular Risk Assessment: The patient is doing well from a cardiac perspective. Therefore, based on ACC/AHA guidelines, the patient would be at acceptable risk for the planned procedure without further cardiovascular testing. The patient was advised that if he develops new symptoms prior to surgery to contact our office to arrange for a follow-up visit, and he verbalized understanding. According to the Revised Cardiac Risk Index (RCRI), her Perioperative Risk of Major Cardiac Event is (%): 0.9. Her Functional Capacity in METs is: 7.59 according to the Duke Activity Status Index (DASI)."  Anticipate pt can proceed with planned procedure barring acute status change.   VS: BP (!) 151/92   Pulse 63   Temp 36.7 C (Oral)   Resp 16   Ht '5\' 6"'$  (1.676 m)   Wt 63.5 kg   SpO2 99%   BMI 22.60 kg/m   PROVIDERS: Nicoletta Dress, MD is PCP   Virl Axe, MD is EP LABS: Labs reviewed: Acceptable for surgery. (all labs ordered are listed, but only abnormal results are displayed)  Labs Reviewed  SURGICAL PCR SCREEN - Abnormal; Notable for the following components:      Result Value   Staphylococcus aureus POSITIVE (*)    All other components within normal limits  BASIC METABOLIC PANEL - Abnormal; Notable for the following components:   Glucose, Bld 105 (*)    All other components within normal limits  CBC     IMAGES:   EKG:   CV: Echo 05/13/2016 Study  Conclusions   - Left ventricle: The cavity size was normal. Wall thickness was    increased in a pattern of mild LVH. Systolic function was normal.    The estimated ejection fraction was in the range of 50% to 55%.    Doppler parameters are consistent with abnormal left ventricular    relaxation (grade 1 diastolic dysfunction).  Past Medical History:  Diagnosis Date   Arthritis    Complete heart block (HCC)    intermittent, documented by prev ILR   GERD (gastroesophageal reflux disease)    HTN (hypertension)    Pacemaker    Implanted 2008   PONV (postoperative nausea and vomiting)    PVC's (premature ventricular contractions)    S/P cardiac catheterization    2001- nonobstructive disease; nl lv function   Shingles     Past Surgical History:  Procedure Laterality Date   BACK SURGERY  2010   [place]     COLONOSCOPY  07/22/2011   Colonic Polyp, mild sigmoid diverticulosis, small internal hemorrhoids. Bx: hyperplastic polyps.   EP IMPLANTABLE DEVICE N/A 06/10/2016   Procedure: PPM Generator Changeout;  Surgeon: Will Meredith Leeds, MD;  Location: [facility];  Service: Cardiovascular;  Laterality: N/A;   ERCP  04/26/2015   Normal cholangiogram- no hilar/righthepatic duct stricture visualized.    MEDICATIONS:  acetaminophen (TYLENOL) 650 MG CR tablet  albuterol (VENTOLIN HFA) 108 (90 Base) MCG/ACT inhaler   alendronate (FOSAMAX) 70 MG tablet   amoxicillin (AMOXIL) 500 MG capsule   Calcium Carbonate (CALTRATE 600 PO)   carboxymethylcellulose (REFRESH PLUS) 0.5 % SOLN   carvedilol (COREG) 6.25 MG tablet   Cholecalciferol (VITAMIN D) 1000 UNITS capsule   ciclopirox (PENLAC) 8 % solution   fish oil-omega-3 fatty acids 1000 MG capsule   furosemide (LASIX) 20 MG tablet   Homeopathic Products (LEG CRAMPS) SUBL   ibuprofen (ADVIL) 200 MG tablet   losartan (COZAAR) 50 MG tablet   Misc Natural Products (GLUCOSAMINE  CHOND MSM FORMULA PO)   Multiple Vitamins-Minerals (MULTIVITAMIN WITH MINERALS) tablet   omeprazole (PRILOSEC) 20 MG capsule   promethazine (PHENERGAN) 25 MG tablet   rOPINIRole (REQUIP) 4 MG tablet   simvastatin (ZOCOR) 20 MG tablet   Turmeric 500 MG CAPS   venlafaxine XR (EFFEXOR-XR) 75 MG 24 hr capsule   zinc gluconate 50 MG tablet    0.9 %  sodium chloride infusion   triamcinolone acetonide (KENALOG) 10 MG/ML injection 10 mg   triamcinolone acetonide (KENALOG-40) injection 20 mg   [facility], PA-C [facility] Pre-Surgical Testing [phone]

## 2022-07-04 NOTE — Anesthesia Preprocedure Evaluation (Addendum)
Anesthesia Evaluation  Patient identified by MRN, date of birth, ID band Patient awake    Reviewed: Allergy & Precautions, NPO status , Patient's Chart, lab work & pertinent test results, reviewed documented beta blocker date and time   History of Anesthesia Complications Negative for: history of anesthetic complications  Airway Mallampati: II  TM Distance: >3 FB Neck ROM: Full    Dental no notable dental hx.    Pulmonary neg pulmonary ROS,    Pulmonary exam normal breath sounds clear to auscultation       Cardiovascular hypertension (140/76 in preop), Pt. on medications and Pt. on home beta blockers + CAD (nonobstructive on cath 2001)  Normal cardiovascular exam+ pacemaker (CHB s/p PPM 2008)  Rhythm:Regular Rate:Normal  Echo 2017 Left ventricle: The cavity size was normal. Wall thickness was  increased in a pattern of mild LVH. Systolic function was normal.  The estimated ejection fraction was in the range of 50% to 55%.  Doppler parameters are consistent with abnormal left ventricular  relaxation (grade 1 diastolic dysfunction).    Neuro/Psych negative neurological ROS  negative psych ROS   GI/Hepatic Neg liver ROS, GERD  Controlled and Medicated,  Endo/Other  negative endocrine ROS  Renal/GU negative Renal ROS  negative genitourinary   Musculoskeletal  (+) Arthritis , Osteoarthritis,  Back surgery x 1 2010   Abdominal   Peds  Hematology negative hematology ROS (+) Hb 12.7, plt 184   Anesthesia Other Findings   Reproductive/Obstetrics negative OB ROS                           Anesthesia Physical Anesthesia Plan  ASA: 3  Anesthesia Plan: Spinal, Regional and MAC   Post-op Pain Management: Tylenol PO (pre-op)*   Induction:   PONV Risk Score and Plan: 2 and Propofol infusion and TIVA  Airway Management Planned: Natural Airway and Nasal Cannula  Additional Equipment:  None  Intra-op Plan:   Post-operative Plan:   Informed Consent: I have reviewed the patients History and Physical, chart, labs and discussed the procedure including the risks, benefits and alternatives for the proposed anesthesia with the patient or authorized representative who has indicated his/her understanding and acceptance.       Plan Discussed with: CRNA  Anesthesia Plan Comments:       Anesthesia Quick Evaluation

## 2022-07-11 DIAGNOSIS — Z780 Asymptomatic menopausal state: Secondary | ICD-10-CM | POA: Diagnosis not present

## 2022-07-11 DIAGNOSIS — Z1231 Encounter for screening mammogram for malignant neoplasm of breast: Secondary | ICD-10-CM | POA: Diagnosis not present

## 2022-07-11 DIAGNOSIS — M8589 Other specified disorders of bone density and structure, multiple sites: Secondary | ICD-10-CM | POA: Diagnosis not present

## 2022-07-15 ENCOUNTER — Other Ambulatory Visit: Payer: Self-pay

## 2022-07-15 ENCOUNTER — Inpatient Hospital Stay (HOSPITAL_COMMUNITY): Payer: Medicare Other | Admitting: Physician Assistant

## 2022-07-15 ENCOUNTER — Inpatient Hospital Stay (HOSPITAL_COMMUNITY): Payer: Medicare Other | Admitting: Anesthesiology

## 2022-07-15 ENCOUNTER — Encounter (HOSPITAL_COMMUNITY): Payer: Self-pay | Admitting: Orthopedic Surgery

## 2022-07-15 ENCOUNTER — Encounter (HOSPITAL_COMMUNITY)
Admission: RE | Disposition: A | Discharge status: Home Health | Payer: Self-pay | Source: Home / Self Care | Attending: Orthopedic Surgery

## 2022-07-15 ENCOUNTER — Inpatient Hospital Stay (HOSPITAL_COMMUNITY): Payer: Medicare Other

## 2022-07-15 ENCOUNTER — Inpatient Hospital Stay (HOSPITAL_COMMUNITY)
Admission: RE | Admit: 2022-07-15 | Discharge: 2022-07-16 | DRG: 470 | Disposition: A | Discharge status: Home Health | Payer: Medicare Other | Attending: Orthopedic Surgery | Admitting: Orthopedic Surgery

## 2022-07-15 DIAGNOSIS — Z833 Family history of diabetes mellitus: Secondary | ICD-10-CM

## 2022-07-15 DIAGNOSIS — K219 Gastro-esophageal reflux disease without esophagitis: Secondary | ICD-10-CM | POA: Diagnosis not present

## 2022-07-15 DIAGNOSIS — I442 Atrioventricular block, complete: Secondary | ICD-10-CM | POA: Diagnosis not present

## 2022-07-15 DIAGNOSIS — M1711 Unilateral primary osteoarthritis, right knee: Secondary | ICD-10-CM

## 2022-07-15 DIAGNOSIS — Z79899 Other long term (current) drug therapy: Secondary | ICD-10-CM

## 2022-07-15 DIAGNOSIS — Z8249 Family history of ischemic heart disease and other diseases of the circulatory system: Secondary | ICD-10-CM | POA: Diagnosis not present

## 2022-07-15 DIAGNOSIS — Z950 Presence of cardiac pacemaker: Secondary | ICD-10-CM

## 2022-07-15 DIAGNOSIS — Z888 Allergy status to other drugs, medicaments and biological substances status: Secondary | ICD-10-CM | POA: Diagnosis not present

## 2022-07-15 DIAGNOSIS — Z471 Aftercare following joint replacement surgery: Secondary | ICD-10-CM | POA: Diagnosis not present

## 2022-07-15 DIAGNOSIS — Z885 Allergy status to narcotic agent status: Secondary | ICD-10-CM | POA: Diagnosis not present

## 2022-07-15 DIAGNOSIS — I2510 Atherosclerotic heart disease of native coronary artery without angina pectoris: Secondary | ICD-10-CM | POA: Diagnosis not present

## 2022-07-15 DIAGNOSIS — Z8719 Personal history of other diseases of the digestive system: Secondary | ICD-10-CM

## 2022-07-15 DIAGNOSIS — Z01818 Encounter for other preprocedural examination: Secondary | ICD-10-CM

## 2022-07-15 DIAGNOSIS — G8918 Other acute postprocedural pain: Secondary | ICD-10-CM | POA: Diagnosis not present

## 2022-07-15 DIAGNOSIS — I10 Essential (primary) hypertension: Secondary | ICD-10-CM | POA: Diagnosis present

## 2022-07-15 DIAGNOSIS — Z96651 Presence of right artificial knee joint: Secondary | ICD-10-CM | POA: Diagnosis not present

## 2022-07-15 HISTORY — PX: TOTAL KNEE ARTHROPLASTY: SHX125

## 2022-07-15 SURGERY — ARTHROPLASTY, KNEE, TOTAL
Anesthesia: Monitor Anesthesia Care | Site: Knee | Laterality: Right

## 2022-07-15 MED ORDER — ACETAMINOPHEN 325 MG PO TABS: 325.0000 mg | ORAL_TABLET | Freq: Four times a day (QID) | ORAL | Status: DC | PRN

## 2022-07-15 MED ORDER — VENLAFAXINE HCL ER 75 MG PO CP24
75.0000 mg | ORAL_CAPSULE | Freq: Every day | ORAL | Status: DC
  Administered 2022-07-16: 75 mg via ORAL
  Filled 2022-07-15: qty 1

## 2022-07-15 MED ORDER — ACETAMINOPHEN 500 MG PO TABS
ORAL_TABLET | ORAL | Status: AC
  Filled 2022-07-15: qty 2

## 2022-07-15 MED ORDER — ORAL CARE MOUTH RINSE: 15.0000 mL | Freq: Once | OROMUCOSAL | Status: AC

## 2022-07-15 MED ORDER — TRANEXAMIC ACID-NACL 1000-0.7 MG/100ML-% IV SOLN
1000.0000 mg | INTRAVENOUS | Status: AC
  Administered 2022-07-15: 1000 mg via INTRAVENOUS
  Filled 2022-07-15: qty 100

## 2022-07-15 MED ORDER — OXYCODONE HCL 5 MG PO TABS
5.0000 mg | ORAL_TABLET | ORAL | Status: DC | PRN
  Administered 2022-07-15 – 2022-07-16 (×4): 5 mg via ORAL
  Administered 2022-07-16 (×2): 10 mg via ORAL
  Filled 2022-07-15: qty 1
  Filled 2022-07-15 (×2): qty 2
  Filled 2022-07-15: qty 1
  Filled 2022-07-15: qty 2

## 2022-07-15 MED ORDER — ALBUTEROL SULFATE (2.5 MG/3ML) 0.083% IN NEBU: 2.5000 mg | INHALATION_SOLUTION | Freq: Four times a day (QID) | RESPIRATORY_TRACT | Status: DC | PRN

## 2022-07-15 MED ORDER — SODIUM CHLORIDE (PF) 0.9 % IJ SOLN
INTRAMUSCULAR | Status: AC
  Filled 2022-07-15: qty 50

## 2022-07-15 MED ORDER — ONDANSETRON HCL 4 MG/2ML IJ SOLN
INTRAMUSCULAR | Status: DC | PRN
  Administered 2022-07-15: 4 mg via INTRAVENOUS

## 2022-07-15 MED ORDER — ONDANSETRON HCL 4 MG/2ML IJ SOLN: 4.0000 mg | Freq: Four times a day (QID) | INTRAMUSCULAR | Status: DC | PRN

## 2022-07-15 MED ORDER — POVIDONE-IODINE 10 % EX SWAB: 2.0000 | Freq: Once | CUTANEOUS | Status: AC

## 2022-07-15 MED ORDER — PROPOFOL 10 MG/ML IV BOLUS
INTRAVENOUS | Status: DC | PRN
  Administered 2022-07-15: 30 mg via INTRAVENOUS

## 2022-07-15 MED ORDER — DEXAMETHASONE SODIUM PHOSPHATE 10 MG/ML IJ SOLN
INTRAMUSCULAR | Status: DC | PRN
  Administered 2022-07-15: 10 mg

## 2022-07-15 MED ORDER — OXYCODONE HCL 5 MG PO TABS
ORAL_TABLET | ORAL | Status: AC
  Filled 2022-07-15: qty 1

## 2022-07-15 MED ORDER — ORAL CARE MOUTH RINSE: 15.0000 mL | OROMUCOSAL | Status: DC | PRN

## 2022-07-15 MED ORDER — CEFAZOLIN SODIUM-DEXTROSE 2-3 GM-%(50ML) IV SOLR: INTRAVENOUS | Status: DC | PRN

## 2022-07-15 MED ORDER — DOCUSATE SODIUM 100 MG PO CAPS
100.0000 mg | ORAL_CAPSULE | Freq: Two times a day (BID) | ORAL | Status: DC
  Administered 2022-07-15 – 2022-07-16 (×2): 100 mg via ORAL
  Filled 2022-07-15 (×2): qty 1

## 2022-07-15 MED ORDER — AMISULPRIDE (ANTIEMETIC) 5 MG/2ML IV SOLN: 10.0000 mg | Freq: Once | INTRAVENOUS | Status: DC | PRN

## 2022-07-15 MED ORDER — PHENOL 1.4 % MT LIQD: 1.0000 | OROMUCOSAL | Status: DC | PRN

## 2022-07-15 MED ORDER — HYDROMORPHONE HCL 1 MG/ML IJ SOLN
0.5000 mg | INTRAMUSCULAR | Status: DC | PRN
  Administered 2022-07-15 – 2022-07-16 (×2): 0.5 mg via INTRAVENOUS
  Filled 2022-07-15 (×2): qty 0.5

## 2022-07-15 MED ORDER — ALBUTEROL SULFATE HFA 108 (90 BASE) MCG/ACT IN AERS: 2.0000 | INHALATION_SPRAY | Freq: Four times a day (QID) | RESPIRATORY_TRACT | Status: DC | PRN

## 2022-07-15 MED ORDER — PROPOFOL 10 MG/ML IV BOLUS
INTRAVENOUS | Status: AC
  Filled 2022-07-15: qty 20

## 2022-07-15 MED ORDER — PHENYLEPHRINE HCL (PRESSORS) 10 MG/ML IV SOLN
INTRAVENOUS | Status: AC
  Filled 2022-07-15: qty 1

## 2022-07-15 MED ORDER — SODIUM CHLORIDE 0.9 % IR SOLN
Status: DC | PRN
  Administered 2022-07-15: 3000 mL

## 2022-07-15 MED ORDER — PROPOFOL 500 MG/50ML IV EMUL
INTRAVENOUS | Status: DC | PRN
  Administered 2022-07-15: 50 ug/kg/min via INTRAVENOUS

## 2022-07-15 MED ORDER — LACTATED RINGERS IV SOLN: INTRAVENOUS | Status: DC

## 2022-07-15 MED ORDER — VITAMIN D 25 MCG (1000 UNIT) PO TABS
1000.0000 [IU] | ORAL_TABLET | Freq: Every day | ORAL | Status: DC
  Administered 2022-07-16: 1000 [IU] via ORAL
  Filled 2022-07-15: qty 1

## 2022-07-15 MED ORDER — ASPIRIN 81 MG PO CHEW
81.0000 mg | CHEWABLE_TABLET | Freq: Two times a day (BID) | ORAL | Status: DC
  Administered 2022-07-15 – 2022-07-16 (×2): 81 mg via ORAL
  Filled 2022-07-15 (×2): qty 1

## 2022-07-15 MED ORDER — BUPIVACAINE LIPOSOME 1.3 % IJ SUSP
INTRAMUSCULAR | Status: AC
  Filled 2022-07-15: qty 20

## 2022-07-15 MED ORDER — ACETAMINOPHEN 500 MG PO TABS: 1000.0000 mg | ORAL_TABLET | Freq: Once | ORAL | Status: DC

## 2022-07-15 MED ORDER — ACETAMINOPHEN 500 MG PO TABS
1000.0000 mg | ORAL_TABLET | Freq: Four times a day (QID) | ORAL | Status: AC
  Administered 2022-07-15 – 2022-07-16 (×3): 1000 mg via ORAL
  Filled 2022-07-15 (×2): qty 2

## 2022-07-15 MED ORDER — HYDROMORPHONE HCL 1 MG/ML IJ SOLN: 0.2500 mg | INTRAMUSCULAR | Status: DC | PRN

## 2022-07-15 MED ORDER — SODIUM CHLORIDE (PF) 0.9 % IJ SOLN
INTRAMUSCULAR | Status: DC | PRN
  Administered 2022-07-15: 60 mL

## 2022-07-15 MED ORDER — ONDANSETRON HCL 4 MG/2ML IJ SOLN: 4.0000 mg | Freq: Once | INTRAMUSCULAR | Status: DC | PRN

## 2022-07-15 MED ORDER — CELECOXIB 200 MG PO CAPS
400.0000 mg | ORAL_CAPSULE | Freq: Once | ORAL | Status: AC
  Administered 2022-07-15: 400 mg via ORAL
  Filled 2022-07-15: qty 2

## 2022-07-15 MED ORDER — LOSARTAN POTASSIUM 50 MG PO TABS
50.0000 mg | ORAL_TABLET | Freq: Every day | ORAL | Status: DC
  Administered 2022-07-16: 50 mg via ORAL
  Filled 2022-07-15: qty 1

## 2022-07-15 MED ORDER — BUPIVACAINE LIPOSOME 1.3 % IJ SUSP
INTRAMUSCULAR | Status: DC | PRN
  Administered 2022-07-15: 20 mL

## 2022-07-15 MED ORDER — CARVEDILOL 6.25 MG PO TABS
6.2500 mg | ORAL_TABLET | Freq: Two times a day (BID) | ORAL | Status: DC
  Administered 2022-07-15 – 2022-07-16 (×2): 6.25 mg via ORAL
  Filled 2022-07-15 (×2): qty 1

## 2022-07-15 MED ORDER — ROPINIROLE HCL 1 MG PO TABS
4.0000 mg | ORAL_TABLET | Freq: Every day | ORAL | Status: DC
  Administered 2022-07-15: 4 mg via ORAL
  Filled 2022-07-15: qty 4

## 2022-07-15 MED ORDER — ROPIVACAINE HCL 5 MG/ML IJ SOLN
INTRAMUSCULAR | Status: DC | PRN
  Administered 2022-07-15: 30 mL via PERINEURAL

## 2022-07-15 MED ORDER — POLYETHYLENE GLYCOL 3350 17 G PO PACK: 17.0000 g | PACK | Freq: Every day | ORAL | Status: DC | PRN

## 2022-07-15 MED ORDER — ONDANSETRON HCL 4 MG PO TABS: 4.0000 mg | ORAL_TABLET | Freq: Four times a day (QID) | ORAL | Status: DC | PRN

## 2022-07-15 MED ORDER — BUPIVACAINE IN DEXTROSE 0.75-8.25 % IT SOLN
INTRATHECAL | Status: DC | PRN
  Administered 2022-07-15: 1.8 mL via INTRATHECAL

## 2022-07-15 MED ORDER — PHENYLEPHRINE 80 MCG/ML (10ML) SYRINGE FOR IV PUSH (FOR BLOOD PRESSURE SUPPORT)
PREFILLED_SYRINGE | INTRAVENOUS | Status: AC
  Filled 2022-07-15: qty 10

## 2022-07-15 MED ORDER — FENTANYL CITRATE PF 50 MCG/ML IJ SOSY
100.0000 ug | PREFILLED_SYRINGE | INTRAMUSCULAR | Status: AC
  Administered 2022-07-15: 50 ug via INTRAVENOUS
  Filled 2022-07-15: qty 2

## 2022-07-15 MED ORDER — WATER FOR IRRIGATION, STERILE IR SOLN
Status: DC | PRN
  Administered 2022-07-15: 2000 mL

## 2022-07-15 MED ORDER — PHENYLEPHRINE 80 MCG/ML (10ML) SYRINGE FOR IV PUSH (FOR BLOOD PRESSURE SUPPORT)
PREFILLED_SYRINGE | INTRAVENOUS | Status: DC | PRN
  Administered 2022-07-15: 160 ug via INTRAVENOUS

## 2022-07-15 MED ORDER — PANTOPRAZOLE SODIUM 40 MG PO TBEC
40.0000 mg | DELAYED_RELEASE_TABLET | Freq: Every day | ORAL | Status: DC
  Administered 2022-07-15 – 2022-07-16 (×2): 40 mg via ORAL
  Filled 2022-07-15 (×2): qty 1

## 2022-07-15 MED ORDER — PROPOFOL 500 MG/50ML IV EMUL
INTRAVENOUS | Status: AC
  Filled 2022-07-15: qty 50

## 2022-07-15 MED ORDER — ISOPROPYL ALCOHOL 70 % SOLN
Status: DC | PRN
  Administered 2022-07-15: 1 via TOPICAL

## 2022-07-15 MED ORDER — CEFAZOLIN SODIUM-DEXTROSE 2-4 GM/100ML-% IV SOLN
2.0000 g | Freq: Four times a day (QID) | INTRAVENOUS | Status: AC
  Administered 2022-07-15 (×2): 2 g via INTRAVENOUS
  Filled 2022-07-15 (×2): qty 100

## 2022-07-15 MED ORDER — OXYCODONE HCL 5 MG/5ML PO SOLN: 5.0000 mg | Freq: Once | ORAL | Status: DC | PRN

## 2022-07-15 MED ORDER — KETOROLAC TROMETHAMINE 15 MG/ML IJ SOLN
7.5000 mg | Freq: Four times a day (QID) | INTRAMUSCULAR | Status: AC
  Administered 2022-07-15 – 2022-07-16 (×3): 7.5 mg via INTRAVENOUS
  Filled 2022-07-15 (×2): qty 1

## 2022-07-15 MED ORDER — ACETAMINOPHEN 500 MG PO TABS
1000.0000 mg | ORAL_TABLET | Freq: Once | ORAL | Status: AC
  Administered 2022-07-15: 1000 mg via ORAL
  Filled 2022-07-15: qty 2

## 2022-07-15 MED ORDER — CHLORHEXIDINE GLUCONATE 0.12 % MT SOLN
15.0000 mL | Freq: Once | OROMUCOSAL | Status: AC
  Administered 2022-07-15: 15 mL via OROMUCOSAL

## 2022-07-15 MED ORDER — BUPIVACAINE LIPOSOME 1.3 % IJ SUSP: 20.0000 mL | Freq: Once | INTRAMUSCULAR | Status: AC

## 2022-07-15 MED ORDER — SIMVASTATIN 20 MG PO TABS
20.0000 mg | ORAL_TABLET | Freq: Every day | ORAL | Status: DC
  Administered 2022-07-16: 20 mg via ORAL
  Filled 2022-07-15: qty 1

## 2022-07-15 MED ORDER — OXYCODONE HCL 5 MG PO TABS: 5.0000 mg | ORAL_TABLET | Freq: Once | ORAL | Status: DC | PRN

## 2022-07-15 MED ORDER — POVIDONE-IODINE 10 % EX SWAB
2.0000 | Freq: Once | CUTANEOUS | Status: AC
  Administered 2022-07-15: 2 via TOPICAL

## 2022-07-15 MED ORDER — 0.9 % SODIUM CHLORIDE (POUR BTL) OPTIME
TOPICAL | Status: DC | PRN
  Administered 2022-07-15: 1000 mL

## 2022-07-15 MED ORDER — MENTHOL 3 MG MT LOZG: 1.0000 | LOZENGE | OROMUCOSAL | Status: DC | PRN

## 2022-07-15 MED ORDER — PHENYLEPHRINE HCL-NACL 20-0.9 MG/250ML-% IV SOLN
INTRAVENOUS | Status: DC | PRN
  Administered 2022-07-15: 50 ug/min via INTRAVENOUS

## 2022-07-15 MED ORDER — DIPHENHYDRAMINE HCL 12.5 MG/5ML PO ELIX: 12.5000 mg | ORAL_SOLUTION | ORAL | Status: DC | PRN

## 2022-07-15 MED ORDER — KETOROLAC TROMETHAMINE 15 MG/ML IJ SOLN
INTRAMUSCULAR | Status: AC
  Filled 2022-07-15: qty 1

## 2022-07-15 MED ORDER — SODIUM CHLORIDE (PF) 0.9 % IJ SOLN
INTRAMUSCULAR | Status: AC
  Filled 2022-07-15: qty 10

## 2022-07-15 MED ORDER — CEFAZOLIN SODIUM-DEXTROSE 2-4 GM/100ML-% IV SOLN
2.0000 g | INTRAVENOUS | Status: AC
  Administered 2022-07-15: 2 g via INTRAVENOUS
  Filled 2022-07-15: qty 100

## 2022-07-15 MED ORDER — MIDAZOLAM HCL 2 MG/2ML IJ SOLN
2.0000 mg | INTRAMUSCULAR | Status: DC
  Filled 2022-07-15: qty 2

## 2022-07-15 MED ORDER — ONDANSETRON HCL 4 MG/2ML IJ SOLN
INTRAMUSCULAR | Status: AC
  Filled 2022-07-15: qty 2

## 2022-07-15 SURGICAL SUPPLY — 63 items
ADH SKN CLS APL DERMABOND .7 (GAUZE/BANDAGES/DRESSINGS) ×1
APL PRP STRL LF DISP 70% ISPRP (MISCELLANEOUS) ×2
BAG COUNTER SPONGE SURGICOUNT (BAG) IMPLANT
BAG SPNG CNTER NS LX DISP (BAG) ×1
BLADE SAG 18X100X1.27 (BLADE) ×1 IMPLANT
BLADE SAW SAG 35X64 .89 (BLADE) ×1 IMPLANT
BNDG CMPR 5X3 CHSV STRCH STRL (GAUZE/BANDAGES/DRESSINGS) ×1
BNDG CMPR MED 10X6 ELC LF (GAUZE/BANDAGES/DRESSINGS) ×1
BNDG COHESIVE 3X5 TAN ST LF (GAUZE/BANDAGES/DRESSINGS) ×1 IMPLANT
BNDG ELASTIC 6X10 VLCR STRL LF (GAUZE/BANDAGES/DRESSINGS) ×1 IMPLANT
BOWL SMART MIX CTS (DISPOSABLE) ×1 IMPLANT
BSPLAT TIB 5D E CMNT KN RT (Knees) ×1 IMPLANT
CEMENT BONE R 1X40 (Cement) IMPLANT
CHLORAPREP W/TINT 26 (MISCELLANEOUS) ×2 IMPLANT
COMP FEM CMT PERSONA SZ7 RT (Joint) ×1 IMPLANT
COMPONENT FEM CMT PRSONA SZ7RT (Joint) IMPLANT
COVER SURGICAL LIGHT HANDLE (MISCELLANEOUS) ×1 IMPLANT
CUFF TOURN SGL QUICK 34 (TOURNIQUET CUFF) ×1
CUFF TRNQT CYL 34X4.125X (TOURNIQUET CUFF) ×1 IMPLANT
DERMABOND ADVANCED .7 DNX12 (GAUZE/BANDAGES/DRESSINGS) ×1 IMPLANT
DRAPE INCISE IOBAN 85X60 (DRAPES) ×1 IMPLANT
DRAPE SHEET LG 3/4 BI-LAMINATE (DRAPES) ×1 IMPLANT
DRAPE U-SHAPE 47X51 STRL (DRAPES) ×1 IMPLANT
DRESSING AQUACEL AG SP 3.5X10 (GAUZE/BANDAGES/DRESSINGS) ×1 IMPLANT
DRSG AQUACEL AG ADV 3.5X10 (GAUZE/BANDAGES/DRESSINGS) IMPLANT
DRSG AQUACEL AG SP 3.5X10 (GAUZE/BANDAGES/DRESSINGS) ×1
ELECT REM PT RETURN 15FT ADLT (MISCELLANEOUS) ×1 IMPLANT
GAUZE SPONGE 4X4 12PLY STRL (GAUZE/BANDAGES/DRESSINGS) ×1 IMPLANT
GLOVE BIOGEL PI IND STRL 8 (GLOVE) ×1 IMPLANT
GLOVE SURG ORTHO 8.0 STRL STRW (GLOVE) ×2 IMPLANT
GOWN STRL REUS W/ TWL XL LVL3 (GOWN DISPOSABLE) ×1 IMPLANT
GOWN STRL REUS W/TWL XL LVL3 (GOWN DISPOSABLE) ×1
HANDPIECE INTERPULSE COAX TIP (DISPOSABLE) ×1
HDLS TROCR DRIL PIN KNEE 75 (PIN) ×1
HOLDER FOLEY CATH W/STRAP (MISCELLANEOUS) ×1 IMPLANT
HOOD PEEL AWAY FLYTE STAYCOOL (MISCELLANEOUS) ×3 IMPLANT
INSERT TIB ARTISURF SZ6-7 (Insert) IMPLANT
MANIFOLD NEPTUNE II (INSTRUMENTS) ×1 IMPLANT
MARKER SKIN DUAL TIP RULER LAB (MISCELLANEOUS) ×1 IMPLANT
NS IRRIG 1000ML POUR BTL (IV SOLUTION) ×1 IMPLANT
PACK TOTAL KNEE CUSTOM (KITS) ×1 IMPLANT
PIN DRILL HDLS TROCAR 75 4PK (PIN) IMPLANT
PROTECTOR NERVE ULNAR (MISCELLANEOUS) ×1 IMPLANT
SCREW HEADED 33MM KNEE (MISCELLANEOUS) IMPLANT
SET HNDPC FAN SPRY TIP SCT (DISPOSABLE) ×1 IMPLANT
SOLUTION IRRIG SURGIPHOR (IV SOLUTION) IMPLANT
SPIKE FLUID TRANSFER (MISCELLANEOUS) ×1 IMPLANT
STEM POLY PAT PLY 29M KNEE (Knees) IMPLANT
STEM TIBIA 5 DEG SZ E R KNEE (Knees) IMPLANT
STRIP CLOSURE SKIN 1/2X4 (GAUZE/BANDAGES/DRESSINGS) ×1 IMPLANT
SUT MNCRL AB 3-0 PS2 18 (SUTURE) ×1 IMPLANT
SUT STRATAFIX 0 PDS 27 VIOLET (SUTURE) ×1
SUT STRATAFIX PDO 1 14 VIOLET (SUTURE) ×1
SUT STRATFX PDO 1 14 VIOLET (SUTURE) ×1
SUT VIC AB 2-0 CT2 27 (SUTURE) ×2 IMPLANT
SUTURE STRATFX 0 PDS 27 VIOLET (SUTURE) ×1 IMPLANT
SUTURE STRATFX PDO 1 14 VIOLET (SUTURE) ×1 IMPLANT
SYR 50ML LL SCALE MARK (SYRINGE) ×1 IMPLANT
TIBIA STEM 5 DEG SZ E R KNEE (Knees) ×1 IMPLANT
TRAY FOLEY MTR SLVR 14FR STAT (SET/KITS/TRAYS/PACK) IMPLANT
TUBE SUCTION HIGH CAP CLEAR NV (SUCTIONS) ×1 IMPLANT
UNDERPAD 30X36 HEAVY ABSORB (UNDERPADS AND DIAPERS) ×1 IMPLANT
WRAP KNEE MAXI GEL POST OP (GAUZE/BANDAGES/DRESSINGS) IMPLANT

## 2022-07-15 NOTE — Anesthesia Postprocedure Evaluation (Signed)
Anesthesia Post Note  Patient: Amanda Bryant  Procedure(s) Performed: TOTAL KNEE ARTHROPLASTY (Right: Knee)     Patient location during evaluation: PACU Anesthesia Type: Regional and Spinal Level of consciousness: awake and alert, patient cooperative and oriented Pain management: pain level controlled Vital Signs Assessment: post-procedure vital signs reviewed and stable Respiratory status: nonlabored ventilation, spontaneous breathing and respiratory function stable Cardiovascular status: stable and blood pressure returned to baseline Postop Assessment: no apparent nausea or vomiting, patient able to bend at knees and spinal receding Anesthetic complications: no   No notable events documented.  Last Vitals:  Vitals:   07/15/22 1343 07/15/22 1358  BP: 138/76 (!) 160/62  Pulse: 80 (!) 58  Resp: 17 13  Temp:  36.6 C  SpO2: 98% 94%    Last Pain:  Vitals:   07/15/22 1358  TempSrc:   PainSc: 0-No pain    LLE Motor Response: Purposeful movement (07/15/22 1358) LLE Sensation: Decreased (07/15/22 1358) RLE Motor Response: Purposeful movement (07/15/22 1358) RLE Sensation: Decreased (07/15/22 1358) L Sensory Level: L4-Anterior knee, lower leg (07/15/22 1358) R Sensory Level: L5-Outer lower leg, top of foot, great toe (07/15/22 1358)  Lumen Brinlee,E. Griffith Santilli

## 2022-07-15 NOTE — Plan of Care (Signed)
  Problem: Education: Goal: Knowledge of the prescribed therapeutic regimen will improve Outcome: Progressing   Problem: Activity: Goal: Range of joint motion will improve Outcome: Progressing   Problem: Pain Management: Goal: Pain level will decrease with appropriate interventions Outcome: Progressing   Problem: Safety: Goal: Ability to remain free from injury will improve Outcome: Progressing   

## 2022-07-15 NOTE — Interval H&P Note (Signed)

## 2022-07-15 NOTE — Evaluation (Signed)
Physical Therapy Evaluation Patient Details Name: Amanda Bryant MRN: [id number] DOB: Jan 13, 1941 Today's Date: 07/15/2022  History of Present Illness  81 yo female s/p R TKA 07/15/22. 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 She walked ~75 feet with a RW. Moderate pain with activity. Anticipate pt will continue to do well as long as pain is controlled. Will plan to follow pt and progress activity as tolerated.        Recommendations for follow up therapy are one component of a multi-disciplinary discharge planning process, led by the attending physician.  Recommendations may be updated based on patient status, additional functional criteria and insurance authorization.  Follow Up Recommendations Follow physician's recommendations for discharge plan and follow up therapies      Assistance Recommended at Discharge Intermittent Supervision/Assistance  Patient can return home with the following  A little help with walking and/or transfers;A little help with bathing/dressing/bathroom;Help with stairs or ramp for entrance;Assistance with cooking/housework;Assist for transportation    Equipment Recommendations None recommended by PT  Recommendations for Other Services       Functional Status Assessment Patient has had a recent decline in their functional status and demonstrates the ability to make significant improvements in function in a reasonable and predictable amount of time.     Precautions / Restrictions Precautions Precautions: Fall;Knee Restrictions Weight Bearing Restrictions: No Other Position/Activity Restrictions: WBAT      Mobility  Bed Mobility Overal bed mobility: Needs Assistance Bed Mobility: Supine to Sit     Supine to sit: Supervision, HOB elevated     General bed mobility comments: Supv for safety, lines.    Transfers Overall transfer level: Needs assistance Equipment used: Rolling walker  (2 wheels) Transfers: Sit to/from Stand Sit to Stand: Min guard           General transfer comment: Cues for safety, technique, hand/LE placement.    Ambulation/Gait Ambulation/Gait assistance: Min guard Gait Distance (Feet): 75 Feet Assistive device: Rolling walker (2 wheels) Gait Pattern/deviations: Step-through pattern, Decreased stride length       General Gait Details: Min guard for safesty. Steady with RW. No dizziness. Tolerated distance well.  Stairs            Wheelchair Mobility    Modified Rankin (Stroke Patients Only)       Balance Overall balance assessment: Needs assistance         Standing balance support: Bilateral upper extremity supported, Reliant on assistive device for balance, During functional activity                                 Pertinent Vitals/Pain Pain Assessment Pain Assessment: 0-10 Pain Score: 6  Pain Location: R knee Pain Descriptors / Indicators: Discomfort, Sore Pain Intervention(s): Limited activity within patient's tolerance, Monitored during session, Repositioned    Home Living Family/patient expects to be discharged to:: Private residence Living Arrangements: Spouse/significant other Available Help at Discharge: Family Type of Home: House Home Access: Ramped entrance       Home Layout: One level Home Equipment: Conservation officer, nature (2 wheels)      Prior Function Prior Level of Function : Independent/Modified Independent                     Hand Dominance        Extremity/Trunk Assessment   Upper Extremity  Assessment Upper Extremity Assessment: Overall WFL for tasks assessed    Lower Extremity Assessment Lower Extremity Assessment: Generalized weakness    Cervical / Trunk Assessment Cervical / Trunk Assessment: Normal  Communication   Communication: No difficulties  Cognition Arousal/Alertness: Awake/alert Behavior During Therapy: WFL for tasks assessed/performed Overall  Cognitive Status: Within Functional Limits for tasks assessed                                          General Comments      Exercises     Assessment/Plan    PT Assessment Patient needs continued PT services  PT Problem List Decreased strength;Decreased mobility;Decreased range of motion;Decreased activity tolerance;Decreased balance;Decreased knowledge of use of DME;Pain       PT Treatment Interventions DME instruction;Gait training;Therapeutic activities;Therapeutic exercise;Patient/family education;Balance training;Functional mobility training    PT Goals (Current goals can be found in the Care Plan section)  Acute Rehab PT Goals Patient Stated Goal: home tomorrow PT Goal Formulation: With patient/family Time For Goal Achievement: 07/29/22 Potential to Achieve Goals: Good    Frequency 7X/week     Co-evaluation               AM-PAC PT "6 Clicks" Mobility  Outcome Measure Help needed turning from your back to your side while in a flat bed without using bedrails?: A Little Help needed moving from lying on your back to sitting on the side of a flat bed without using bedrails?: A Little Help needed moving to and from a bed to a chair (including a wheelchair)?: A Little Help needed standing up from a chair using your arms (e.g., wheelchair or bedside chair)?: A Little Help needed to walk in hospital room?: A Little Help needed climbing 3-5 steps with a railing? : A Little 6 Click Score: 18    End of Session Equipment Utilized During Treatment: Gait belt Activity Tolerance: Patient tolerated treatment well Patient left: with call bell/phone within reach;with family/visitor present;with chair alarm set;in chair   PT Visit Diagnosis: Pain;Other abnormalities of gait and mobility (R26.89) Pain - Right/Left: Right Pain - part of body: Knee    Time: 3329-5188 PT Time Calculation (min) (ACUTE ONLY): 18 min   Charges:   PT Evaluation $PT Eval Low  Complexity: [place], PT Acute Rehabilitation  Office: [phone] Pager: [phone]

## 2022-07-15 NOTE — Progress Notes (Signed)
Orthopedic Tech Progress Note Patient Details:  Amanda Bryant 20-May-1941 [id number]  Ortho Devices Type of Ortho Device: Bone foam zero knee Ortho Device/Splint Interventions: Application   Post Interventions Patient Tolerated: Well Instructions Provided: Care of device  [place] Pink 07/15/2022, 5:16 PM

## 2022-07-15 NOTE — Op Note (Signed)
DATE OF SURGERY:  07/15/2022 TIME: 1:53 PM  PATIENT NAME:  Amanda Bryant   AGE: 81 y.o.    PRE-OPERATIVE DIAGNOSIS:  End stage right knee osteoarthritis  POST-OPERATIVE DIAGNOSIS:  Same  PROCEDURE:  Right Total Knee Arthroplasty  SURGEON:  Shanyia Stines A Devan Babino, MD   ASSISTANT:  Izola Price, RNFA, present and scrubbed throughout the case, critical for assistance with exposure, retraction, instrumentation, and closure.   OPERATIVE IMPLANTS:  Cemented Zimmer persona femur standard size 7, E tibia, 29 mm patella, 12 mm MC poly bearing Implant Name Type Inv. Item Serial No. Manufacturer Lot No. LRB No. Used Action  CEMENT BONE R 1X40 - [id number] Cement CEMENT BONE R 1X40  [employer]MER [employer](ORTH,TRAU,BIO,SG) [id number] Right 2 Implanted  STEM POLY PAT PLY 12M KNEE - [id number] Knees STEM POLY PAT PLY 12M KNEE  [employer]IMMER [employer](ORTH,TRAU,BIO,SG) [id number] Right 1 Implanted  COMP FEM CMT PERSONA SZ7 RT - [id number] Joint COMP FEM CMT PERSONA SZ7 RT  [employer]MER [employer]ON(ORTH,TRAU,BIO,SG) [id number] Right 1 Implanted  TIBIA STEM 5 DEG SZ E R KNEE - [id number] Knees TIBIA STEM 5 DEG SZ E R KNEE  ZIMMER [employer]ON(ORTH,TRAU,BIO,SG) [id number] Right 1 Implanted  INSERT TIB ARTISURF SZ6-7 - [id number] Insert INSERT TIB ARTISURF SZ6-7  [employer]MER [employer]ON(ORTH,TRAU,BIO,SG) [id number] Right 1 Implanted      PREOPERATIVE INDICATIONS:  Amanda Bryant is a 81 y.o. year old female with end stage bone on bone degenerative arthritis of the knee who failed conservative treatment, including injections, antiinflammatories, activity modification, and assistive devices, and had significant impairment of their activities of daily living, and elected for Total Knee Arthroplasty.   The risks, benefits, and alternatives were discussed at length including but not limited to the risks of infection, bleeding, nerve injury, stiffness, blood clots, the need for revision surgery, cardiopulmonary complications, among others, and they were willing to  proceed.  OPERATIVE FINDINGS AND UNIQUE ASPECTS OF THE CASE: Full-thickness cartilage loss distal posterior femur and posterior lateral tibia  ESTIMATED BLOOD LOSS: 50cc  OPERATIVE DESCRIPTION:   Once adequate anesthesia was induced, preoperative antibiotics, 2 gm of ancef,1 gm of Tranexamic Acid, and 8 mg of Decadron administered, the patient was positioned supine with a right thigh tourniquet placed.  The right lower extremity was prepped and draped in sterile fashion.  A time-  out was performed identifying the patient, planned procedure, and the appropriate extremity.     The leg was  exsanguinated, tourniquet elevated to 250 mmHg.  A midline incision was  made followed by median parapatellar arthrotomy. Anterior horn of the medial meniscus was released and resected. A medial release was performed, the infrapatellar fat pad was resected with care taken to protect the patellar tendon. The suprapatellar fat was removed to exposed the distal anterior femur. The anterior horn of the lateral meniscus and ACL were released.    Following initial  exposure, I first started with the femur  The femoral  canal was opened with a drill, canal was suctioned to try to prevent fat emboli.  An  intramedullary rod was passed set at 5 degrees valgus, 10 mm. The distal femur was resected.  Following this resection, the tibia was  subluxated anteriorly.  Using the extramedullary guide, 10 mm of bone was resected off   the proximal lateral nonworn anterior cartilage tibia.  We confirmed the gap would be  stable medially and laterally with a size 48m spacer block as well as confirmed that the tibial cut was perpendicular in the coronal plane, checking with  an alignment rod.    Once this was done, the posterior femoral referencing femoral sizer was placed under to the posterior condyles with 3 degrees of external rotational which was parallel to the transepicondylar axis and perpendicular to [employer]. The femur  was sized to be a size 7 in the anterior-  posterior dimension. The  anterior, posterior, and  chamfer cuts were made without difficulty nor   notching making certain that I was along the anterior cortex to help  with flexion gap stability. Next a laminar spreader was placed with the knee in flexion and the medial lateral menisci were resected.  5 cc of the Exparel mixture was injected in the medial side of the back of the knee and 3 cc in the lateral side.  1/2 inch curved osteotome was used to resect posterior osteophyte that was then removed with a pituitary rongeur.       At this point, the tibia was sized to be a size E.  The size E tray was  then pinned in position. Trial reduction was now carried with a 7 femur, E tibia, a 10 mm MC insert.  The knee had full extension and was stable to varus valgus stress in extension.  The knee was slightly tight in flexion and the PCL was partially released.  In extension the knee was relatively tight laterally using a 15 blade the IT band was partially released from deep to superficial.  This allowed better balance medial lateral.  Upsized the poly to a 12 mm insert which had good medial lateral stability and full extension.  Attention was next directed to the patella.  Precut  measurement was noted to be 21 mm.  I resected down to 13 mm and used a  22m patellar button to restore patellar height as well as cover the cut surface.     The patella lug holes were drilled and a 29 mm patella poly trial was placed.    The knee was brought to full extension with good flexion stability with the patella tracking through the trochlea without application of pressure.     Next the femoral component was again assessed and determined to be seated and appropriately lateralized.  The femoral lug holes were drilled.  The femoral component was then removed. Tibial component was again assessed and felt to be seated and appropriately rotated with the medial third of the  tubercle. The tibia was then drilled, and keel punched.     Final components were  opened and regular cement was mixed.      Final implants were then  cemented onto cleaned and dried cut surfaces of bone with the knee brought to extension with a 12 mm MC poly.  The knee was irrigated with sterile Betadine diluted in saline as well as pulse lavage normal saline.  The synovial lining was  then injected a dilute Exparel.      Once the cement had fully cured, excess cement was removed throughout the knee.  I confirmed that I was satisfied with the range of motion and stability, and the final 12 mm MC poly insert was chosen.  It was placed into the knee.         The tourniquet had been let down.  No significant hemostasis was required.  The medial parapatellar arthrotomy was then reapproximated using #1 Stratafix sutures with the knee  in flexion.  The remaining wound was closed with 0 stratafix, 2-0 Vicryl, and running 3-0 Monocryl. The  knee was cleaned, dried, dressed sterilely using Dermabond and   Aquacel dressing.  The patient was then brought to recovery room in stable condition, tolerating the procedure  well. There were no complications.   Post op recs: WB: WBAT Abx: ancef Imaging: PACU xrays DVT prophylaxis: Aspirin '81mg'$  BID x4 weeks Follow up: 2 weeks after surgery for a wound check with Dr. Zachery Dakins at [facility].  Address: [address], [place], [place] [address]  Office Phone: [phone]  Charlies Constable, MD Orthopaedic Surgery

## 2022-07-15 NOTE — Anesthesia Procedure Notes (Signed)
Spinal  Patient location during procedure: OR Start time: 07/15/2022 10:30 AM End time: 07/15/2022 10:33 AM Reason for block: surgical anesthesia Staffing Performed: resident/CRNA  Resident/CRNA: Lollie Sails, CRNA Performed by: Lollie Sails, CRNA Authorized by: Pervis Hocking, DO   Preanesthetic Checklist Completed: patient identified, IV checked, site marked, risks and benefits discussed, surgical consent, monitors and equipment checked, pre-op evaluation and timeout performed Spinal Block Patient position: sitting Prep: DuraPrep and site prepped and draped Patient monitoring: heart rate, continuous pulse ox, blood pressure and cardiac monitor Approach: midline Location: L3-4 Injection technique: single-shot Needle Needle type: Pencan  Needle gauge: 24 G Needle length: 10 cm Assessment Events: CSF return Additional Notes Expiration date on kit noted and within range.  Sterile prep and drape.    Local with Lidocaine 1%.  Good CSF flow noted with no heme or c/o paresthesia.   Patient tolerated well.

## 2022-07-15 NOTE — Transfer of Care (Signed)
Immediate Anesthesia Transfer of Care Note  Patient: Amanda Bryant  Procedure(s) Performed: TOTAL KNEE ARTHROPLASTY (Right: Knee)  Patient Location: PACU  Anesthesia Type:Spinal and MAC combined with regional for post-op pain  Level of Consciousness: awake, alert  and patient cooperative  Airway & Oxygen Therapy: Patient Spontanous Breathing and Patient connected to face mask oxygen  Post-op Assessment: Report given to RN and Post -op Vital signs reviewed and stable  Post vital signs: Reviewed and stable  Last Vitals:  Vitals Value Taken Time  BP 137/72 07/15/22 1258  Temp    Pulse 64 07/15/22 1259  Resp 18 07/15/22 1259  SpO2 100 % 07/15/22 1259  Vitals shown include unvalidated device data.  Last Pain:  Vitals:   07/15/22 0906  TempSrc:   PainSc: 0-No pain         Complications: No notable events documented.

## 2022-07-15 NOTE — Anesthesia Procedure Notes (Signed)
Procedure Name: MAC Date/Time: 07/15/2022 10:28 AM  Performed by: Lollie Sails, CRNAPre-anesthesia Checklist: Patient identified, Emergency Drugs available, Suction available, Patient being monitored and Timeout performed Oxygen Delivery Method: Simple face mask Placement Confirmation: positive ETCO2

## 2022-07-15 NOTE — Anesthesia Procedure Notes (Signed)
Anesthesia Regional Block: Adductor canal block   Pre-Anesthetic Checklist: , timeout performed,  Correct Patient, Correct Site, Correct Laterality,  Correct Procedure, Correct Position, site marked,  Risks and benefits discussed,  Surgical consent,  Pre-op evaluation,  At surgeon's request and post-op pain management  Laterality: Right  Prep: Maximum Sterile Barrier Precautions used, chloraprep       Needles:  Injection technique: Single-shot  Needle Type: Echogenic Stimulator Needle     Needle Length: 9cm  Needle Gauge: 22     Additional Needles:   Procedures:,,,, ultrasound used (permanent image in chart),,    Narrative:  Start time: 07/15/2022 8:55 AM End time: 07/15/2022 9:00 AM Injection made incrementally with aspirations every 5 mL.  Performed by: Personally  Anesthesiologist: Pervis Hocking, DO  Additional Notes: Monitors applied. No increased pain on injection. No increased resistance to injection. Injection made in 5cc increments. Good needle visualization. Patient tolerated procedure well.

## 2022-07-16 ENCOUNTER — Encounter (HOSPITAL_COMMUNITY): Payer: Self-pay | Admitting: Orthopedic Surgery

## 2022-07-16 LAB — CBC
HCT: 34 % — ABNORMAL LOW (ref 36.0–46.0)
Hemoglobin: 10.6 g/dL — ABNORMAL LOW (ref 12.0–15.0)
MCH: 28.8 pg (ref 26.0–34.0)
MCHC: 31.2 g/dL (ref 30.0–36.0)
MCV: 92.4 fL (ref 80.0–100.0)
Platelets: 180 10*3/uL (ref 150–400)
RBC: 3.68 MIL/uL — ABNORMAL LOW (ref 3.87–5.11)
RDW: 12.5 % (ref 11.5–15.5)
WBC: 15.8 10*3/uL — ABNORMAL HIGH (ref 4.0–10.5)
nRBC: 0 % (ref 0.0–0.2)

## 2022-07-16 LAB — BASIC METABOLIC PANEL
Anion gap: 7 (ref 5–15)
BUN: 16 mg/dL (ref 8–23)
CO2: 26 mmol/L (ref 22–32)
Calcium: 8.7 mg/dL — ABNORMAL LOW (ref 8.9–10.3)
Chloride: 105 mmol/L (ref 98–111)
Creatinine, Ser: 0.49 mg/dL (ref 0.44–1.00)
GFR, Estimated: >60 mL/min (ref >60)
Glucose, Bld: 130 mg/dL — ABNORMAL HIGH (ref 70–99)
Potassium: 4 mmol/L (ref 3.5–5.1)
Sodium: 138 mmol/L (ref 135–145)

## 2022-07-16 MED ORDER — OXYCODONE HCL 5 MG PO TABS: 2.5000 mg | ORAL_TABLET | ORAL | 0 refills | Status: AC | PRN

## 2022-07-16 MED ORDER — ASPIRIN 81 MG PO TBEC: 81.0000 mg | DELAYED_RELEASE_TABLET | Freq: Two times a day (BID) | ORAL | 0 refills | Status: AC

## 2022-07-16 MED ORDER — IBUPROFEN 200 MG PO TABS: 800.0000 mg | ORAL_TABLET | Freq: Three times a day (TID) | ORAL | 0 refills | Status: DC | PRN

## 2022-07-16 MED ORDER — ONDANSETRON HCL 4 MG PO TABS: 4.0000 mg | ORAL_TABLET | Freq: Three times a day (TID) | ORAL | 0 refills | Status: AC | PRN

## 2022-07-16 NOTE — Progress Notes (Signed)
Physical Therapy Treatment Patient Details Name: Amanda Bryant MRN: [id number] DOB: [birth year] Today's Date: 07/16/2022   History of Present Illness 81 yo female s/p R TKA 07/15/22. 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 Pain has been controlled-pt rated 3/10 during session. Family present. Discussed d/c plan-pt would like to d/c home on today. She has met her PT goals. All PT education completed.     Recommendations for follow up therapy are one component of a multi-disciplinary discharge planning process, led by the attending physician.  Recommendations may be updated based on patient status, additional functional criteria and insurance authorization.  Follow Up Recommendations  Follow physician's recommendations for discharge plan and follow up therapies     Assistance Recommended at Discharge Intermittent Supervision/Assistance  Patient can return home with the following A little help with walking and/or transfers;A little help with bathing/dressing/bathroom;Help with stairs or ramp for entrance;Assistance with cooking/housework;Assist for transportation   Equipment Recommendations  None recommended by PT    Recommendations for Other Services       Precautions / Restrictions Precautions Precautions: Fall;Knee Restrictions Weight Bearing Restrictions: No Other Position/Activity Restrictions: WBAT     Mobility  Bed Mobility Overal bed mobility: Modified Independent             General bed mobility comments: oob in recliner    Transfers Overall transfer level: Needs assistance Equipment used: Rolling walker (2 wheels) Transfers: Sit to/from Stand Sit to Stand: Supervision           General transfer comment: Cues for safety, technique, hand/LE placement.    Ambulation/Gait Ambulation/Gait assistance: Min guard Gait Distance (Feet): 90 Feet Assistive device: Rolling walker (2 wheels) Gait  Pattern/deviations: Step-through pattern, Decreased stride length       General Gait Details: Min guard for safesty. Steady with RW. No dizziness. Tolerated distance well.   Stairs             Wheelchair Mobility    Modified Rankin (Stroke Patients Only)       Balance Overall balance assessment: Needs assistance         Standing balance support: Bilateral upper extremity supported, Reliant on assistive device for balance, During functional activity Standing balance-Leahy Scale: Fair                              Cognition Arousal/Alertness: Awake/alert Behavior During Therapy: WFL for tasks assessed/performed Overall Cognitive Status: Within Functional Limits for tasks assessed                                          Exercises     General Comments        Pertinent Vitals/Pain Pain Assessment Pain Assessment: 0-10 Pain Score: 3  Faces Pain Scale: Hurts little more Pain Location: R knee;R heel Pain Descriptors / Indicators: Aching, Discomfort, Sore Pain Intervention(s): Monitored during session, Repositioned    Home Living                          Prior Function            PT Goals (current goals can now be found in the care plan section) Progress towards PT goals: Progressing toward goals    Frequency  7X/week      PT Plan Current plan remains appropriate    Co-evaluation              AM-PAC PT "6 Clicks" Mobility   Outcome Measure  Help needed turning from your back to your side while in a flat bed without using bedrails?: None Help needed moving from lying on your back to sitting on the side of a flat bed without using bedrails?: None Help needed moving to and from a bed to a chair (including a wheelchair)?: A Little Help needed standing up from a chair using your arms (e.g., wheelchair or bedside chair)?: A Little Help needed to walk in hospital room?: A Little Help needed climbing  3-5 steps with a railing? : A Little 6 Click Score: 20    End of Session Equipment Utilized During Treatment: Gait belt Activity Tolerance: Patient tolerated treatment well Patient left: in chair;with chair alarm set;with call bell/phone within reach;with family/visitor present   PT Visit Diagnosis: Pain;Other abnormalities of gait and mobility (R26.89) Pain - Right/Left: Right Pain - part of body: Knee     Time: 1346-1400 PT Time Calculation (min) (ACUTE ONLY): 14 min  Charges:  $Gait Training: 8-22 mins                         Doreatha Massed, PT Acute Rehabilitation  Office: [phone] Pager: [phone]

## 2022-07-16 NOTE — TOC Transition Note (Signed)
Transition of Care [facility]) - CM/SW Discharge Note   Patient Details  Name: Amanda Bryant MRN: [id number] Date of Birth: [birth year]  Transition of Care [facility]) CM/SW Contact:  Lennart Pall, LCSW Phone Number: 07/16/2022, 9:38 AM   Clinical Narrative:    Met with pt and confirming she has all needed DME at home.  HHPT prearranged with [facility].  No further TOC needs.   Final next level of care: [place] Barriers to Discharge: No Barriers Identified   Patient Goals and CMS Choice Patient states their goals for this hospitalization and ongoing recovery are:: return home      Discharge Placement                       Discharge Plan and Services                DME Arranged: N/A DME Agency: NA       HH Arranged: PT [place] Agency: [place]        Social Determinants of Health (SDOH) Interventions     Readmission Risk Interventions    07/16/2022    9:38 AM  Readmission Risk Prevention Plan  Post Dischage Appt Complete  Medication Screening Complete  Transportation Screening Complete

## 2022-07-16 NOTE — Discharge Summary (Signed)
Physician Discharge Summary  Patient ID: KAYTLYNNE NEACE MRN: [id number] DOB/AGE: [birth year] 81 y.o.  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 female who was admitted 07/15/2022 with a diagnosis of Localized osteoarthritis of right knee and went to the operating room on 07/15/2022 and underwent the above named procedures.    She was given perioperative antibiotics:  Anti-infectives (From admission, onward)    Start     Dose/Rate Route Frequency Ordered Stop   07/15/22 1630  ceFAZolin (ANCEF) IVPB 2g/100 mL premix        2 g 200 mL/hr over 30 Minutes Intravenous Every 6 hours 07/15/22 1434 07/15/22 2313   07/15/22 0815  ceFAZolin (ANCEF) IVPB 2g/100 mL premix        2 g 200 mL/hr over 30 Minutes Intravenous On call to O.R. 07/15/22 0802 07/15/22 1049     .  She was given sequential compression devices, early ambulation, and aspirin for DVT prophylaxis.  She benefited maximally from the hospital stay and there were no complications.    Recent vital signs:  Vitals:   07/16/22 1023 07/16/22 1456  BP: (!) 119/47 (!) 121/49  Pulse: 82 67  Resp:  17  Temp: 98 F (36.7 C) 98 F (36.7 C)  SpO2: 97% 96%    Recent laboratory studies:  Lab Results   Component Value Date   HGB 10.6 (L) 07/16/2022   HGB 12.7 07/03/2022   HGB 12.0 05/22/2022   Lab Results  Component Value Date   WBC 15.8 (H) 07/16/2022   PLT 180 07/16/2022   Lab Results  Component Value Date   INR 0.9 12/01/2007   Lab Results  Component Value Date   NA 138 07/16/2022   K 4.0 07/16/2022   CL 105 07/16/2022   CO2 26 07/16/2022   BUN 16 07/16/2022   CREATININE 0.49 07/16/2022   GLUCOSE 130 (H) 07/16/2022    Discharge Medications:   Allergies as of 07/16/2022       Reactions   Codeine Nausea Only   Prednisone Palpitations, Other (See Comments)   Reaction: Increased heart rate   Ultram [tramadol Hcl] Itching, Nausea And Vomiting   Tape    Pulls skin off    Prednisolone Acetate Palpitations        Medication List     TAKE these medications    acetaminophen 650 MG CR tablet Commonly known as: TYLENOL Take 650-1,300 mg by mouth every 8 (eight) hours as needed for pain.   albuterol 108 (90 Base) MCG/ACT inhaler Commonly known as: VENTOLIN HFA Inhale 2 puffs into the lungs every 6 (six) hours as needed for wheezing or shortness of breath.   alendronate 70 MG tablet Commonly known as: FOSAMAX Take 70 mg by  mouth every Saturday. Take with a full glass of water on an empty stomach.   amoxicillin 500 MG capsule Commonly known as: AMOXIL Take 2,000 mg by mouth See admin instructions. Take 1 hour before dental procedures   aspirin EC 81 MG tablet Take 1 tablet (81 mg total) by mouth 2 (two) times daily for 28 days. Swallow whole. Start taking on: July 17, 2022   CALTRATE 600 PO Take 1 tablet by mouth daily.   carboxymethylcellulose 0.5 % Soln Commonly known as: REFRESH PLUS Place 1 drop into both eyes 3 (three) times daily as needed (dry eyes).   carvedilol 6.25 MG tablet Commonly known as: COREG Take 6.25 mg by mouth 2 (two) times daily.   ciclopirox 8 % solution Commonly known as: PENLAC Apply 1 application  topically daily.    fish oil-omega-3 fatty acids 1000 MG capsule Take 2 g by mouth daily.   furosemide 20 MG tablet Commonly known as: LASIX Take 20-40 mg by mouth 2 (two) times daily.   GLUCOSAMINE CHOND MSM FORMULA PO Take 2 tablets by mouth daily.   ibuprofen 200 MG tablet Commonly known as: ADVIL Take 4 tablets (800 mg total) by mouth every 8 (eight) hours as needed for moderate pain. What changed: how much to take   Leg Cramps Subl Place 1 tablet under the tongue as needed (leg cramps). Hyland's   losartan 50 MG tablet Commonly known as: COZAAR Take 50 mg by mouth daily.   multivitamin with minerals tablet Take 1 tablet by mouth daily.   omeprazole 20 MG capsule Commonly known as: PRILOSEC Take 20 mg by mouth daily.   ondansetron 4 MG tablet Commonly known as: Zofran Take 1 tablet (4 mg total) by mouth every 8 (eight) hours as needed for up to 14 days for nausea or vomiting.   oxyCODONE 5 MG immediate release tablet Commonly known as: Roxicodone Take 0.5-1 tablets (2.5-5 mg total) by mouth every 4 (four) hours as needed for up to 7 days for severe pain or moderate pain.   promethazine 25 MG tablet Commonly known as: PHENERGAN Take 1 tablet (25 mg total) by mouth every 8 (eight) hours as needed for nausea or vomiting.   rOPINIRole 4 MG tablet Commonly known as: REQUIP Take 4 mg by mouth at bedtime.   simvastatin 20 MG tablet Commonly known as: ZOCOR Take 20 mg by mouth daily.   Turmeric 500 MG Caps Take 500 mg by mouth daily.   venlafaxine XR 75 MG 24 hr capsule Commonly known as: EFFEXOR-XR Take 75 mg by mouth daily with breakfast.   Vitamin D 1000 units capsule Take 1,000 Units by mouth daily.   zinc gluconate 50 MG tablet Take 50 mg by mouth daily.        Diagnostic Studies: DG Knee Right Port  Result Date: 07/15/2022 CLINICAL DATA:  Postop right total knee EXAM: PORTABLE RIGHT KNEE - 1-2 VIEW COMPARISON:  None Available. FINDINGS: There is a total knee  arthroplasty in normal alignment without evidence of loosening or periprosthetic fracture. Expected soft tissue changes. IMPRESSION: Right total knee arthroplasty without evidence of immediate hardware complication. Electronically Signed   By: Maurine Simmering M.D.   On: 07/15/2022 14:37    Disposition: Discharge disposition: 01-Home or Self Care       Discharge Instructions     Call MD / Call 911   Complete by: As directed    If you experience chest pain or shortness of breath, CALL 911 and  be transported to the hospital emergency room.  If you develope a fever above 101 F, pus (white drainage) or increased drainage or redness at the wound, or calf pain, call your surgeon's office.   Constipation Prevention   Complete by: As directed    Drink plenty of fluids.  Prune juice may be helpful.  You may use a stool softener, such as Colace (over the counter) 100 mg twice a day.  Use MiraLax (over the counter) for constipation as needed.   Diet - low sodium heart healthy   Complete by: As directed    Do not put a pillow under the knee. Place it under the heel.   Complete by: As directed    Increase activity slowly as tolerated   Complete by: As directed    Post-operative opioid taper instructions:   Complete by: As directed    POST-OPERATIVE OPIOID TAPER INSTRUCTIONS: It is important to wean off of your opioid medication as soon as possible. If you do not need pain medication after your surgery it is ok to stop day one. Opioids include: Codeine, Hydrocodone(Norco, Vicodin), Oxycodone(Percocet, oxycontin) and hydromorphone amongst others.  Long term and even short term use of opiods can cause: Increased pain response Dependence Constipation Depression Respiratory depression And more.  Withdrawal symptoms can include Flu like symptoms Nausea, vomiting And more Techniques to manage these symptoms Hydrate well Eat regular healthy meals Stay active Use relaxation techniques(deep  breathing, meditating, yoga) Do Not substitute Alcohol to help with tapering If you have been on opioids for less than two weeks and do not have pain than it is ok to stop all together.  Plan to wean off of opioids This plan should start within one week post op of your joint replacement. Maintain the same interval or time between taking each dose and first decrease the dose.  Cut the total daily intake of opioids by one tablet each day Next start to increase the time between doses. The last dose that should be eliminated is the evening dose.           Follow-up Information     [facility] [place] Follow up.   Specialty: Home Health Services Why: home physical therapy visits Contact information: [place] [address] [phone]                    Discharge Instructions      INSTRUCTIONS AFTER JOINT REPLACEMENT   Remove items at home which could result in a fall. This includes throw rugs or furniture in walking pathways ICE to the affected joint every three hours while awake for 30 minutes at a time, for at least the first 3-5 days, and then as needed for pain and swelling.  Continue to use ice for pain and swelling. You may notice swelling that will progress down to the foot and ankle.  This is normal after surgery.  Elevate your leg when you are not up walking on it.   Continue to use the breathing machine you got in the hospital (incentive spirometer) which will help keep your temperature down.  It is common for your temperature to cycle up and down following surgery, especially at night when you are not up moving around and exerting yourself.  The breathing machine keeps your lungs expanded and your temperature down.   DIET:  As you were doing prior to hospitalization, we recommend a well-balanced diet.  DRESSING / WOUND CARE / SHOWERING  Keep the surgical dressing until follow up.  The dressing is water proof, so you can shower without  any extra covering.  IF THE DRESSING FALLS OFF or the wound gets wet inside, change the dressing with sterile gauze.  Please use good hand washing techniques before changing the dressing.  Do not use any lotions or creams on the incision until instructed by your surgeon.    ACTIVITY  Increase activity slowly as tolerated, but follow the weight bearing instructions below.   No driving for 6 weeks or until further direction given by your physician.  You cannot drive while taking narcotics.  No lifting or carrying greater than 10 lbs. until further directed by your surgeon. Avoid periods of inactivity such as sitting longer than an hour when not asleep. This helps prevent blood clots.  You may return to work once you are authorized by your doctor.     WEIGHT BEARING   Weight bearing as tolerated with assist device (walker, cane, etc) as directed, use it as long as suggested by your surgeon or therapist, typically at least 4-6 weeks.   EXERCISES  Results after joint replacement surgery are often greatly improved when you follow the exercise, range of motion and muscle strengthening exercises prescribed by your doctor. Safety measures are also important to protect the joint from further injury. Any time any of these exercises cause you to have increased pain or swelling, decrease what you are doing until you are comfortable again and then slowly increase them. If you have problems or questions, call your caregiver or physical therapist for advice.   Rehabilitation is important following a joint replacement. After just a few days of immobilization, the muscles of the leg can become weakened and shrink (atrophy).  These exercises are designed to build up the tone and strength of the thigh and leg muscles and to improve motion. Often times heat used for twenty to thirty minutes before working out will loosen up your tissues and help with improving the range of motion but do not use heat for the first  two weeks following surgery (sometimes heat can increase post-operative swelling).   These exercises can be done on a training (exercise) mat, on the floor, on a table or on a bed. Use whatever works the best and is most comfortable for you.    Use music or television while you are exercising so that the exercises are a pleasant break in your day. This will make your life better with the exercises acting as a break in your routine that you can look forward to.   Perform all exercises about fifteen times, three times per day or as directed.  You should exercise both the operative leg and the other leg as well.  Exercises include:   Quad Sets - Tighten up the muscle on the front of the thigh (Quad) and hold for 5-10 seconds.   Straight Leg Raises - With your knee straight (if you were given a brace, keep it on), lift the leg to 60 degrees, hold for 3 seconds, and slowly lower the leg.  Perform this exercise against resistance later as your leg gets stronger.  Leg Slides: Lying on your back, slowly slide your foot toward your buttocks, bending your knee up off the floor (only go as far as is comfortable). Then slowly slide your foot back down until your leg is flat on the floor again.  Angel Wings: Lying on your back spread your legs to the side as  far apart as you can without causing discomfort.  Hamstring Strength:  Lying on your back, push your heel against the floor with your leg straight by tightening up the muscles of your buttocks.  Repeat, but this time bend your knee to a comfortable angle, and push your heel against the floor.  You may put a pillow under the heel to make it more comfortable if necessary.   A rehabilitation program following joint replacement surgery can speed recovery and prevent re-injury in the future due to weakened muscles. Contact your doctor or a physical therapist for more information on knee rehabilitation.    CONSTIPATION  Constipation is defined medically as fewer  than three stools per week and severe constipation as less than one stool per week.  Even if you have a regular bowel pattern at home, your normal regimen is likely to be disrupted due to multiple reasons following surgery.  Combination of anesthesia, postoperative narcotics, change in appetite and fluid intake all can affect your bowels.   YOU MUST use at least one of the following options; they are listed in order of increasing strength to get the job done.  They are all available over the counter, and you may need to use some, POSSIBLY even all of these options:    Drink plenty of fluids (prune juice may be helpful) and high fiber foods Colace 100 mg by mouth twice a day  Senokot for constipation as directed and as needed Dulcolax (bisacodyl), take with full glass of water  Miralax (polyethylene glycol) once or twice a day as needed.  If you have tried all these things and are unable to have a bowel movement in the first 3-4 days after surgery call either your surgeon or your primary doctor.    If you experience loose stools or diarrhea, hold the medications until you stool forms back up.  If your symptoms do not get better within 1 week or if they get worse, check with your doctor.  If you experience "the worst abdominal pain ever" or develop nausea or vomiting, please contact the office immediately for further recommendations for treatment.   ITCHING:  If you experience itching with your medications, try taking only a single pain pill, or even half a pain pill at a time.  You can also use Benadryl over the counter for itching or also to help with sleep.   TED HOSE STOCKINGS:  Use stockings on both legs until for at least 2 weeks or as directed by physician office. They may be removed at night for sleeping.  MEDICATIONS:  See your medication summary on the "After Visit Summary" that nursing will review with you.  You may have some home medications which will be placed on hold until you complete  the course of blood thinner medication.  It is important for you to complete the blood thinner medication as prescribed.   Blood clot prevention (DVT Prophylaxis): After surgery you are at an increased risk for a blood clot. you were prescribed a blood thinner, Aspirin '81mg'$ , to be taken twice daily for a total of 4 weeks from surgery to help reduce your risk of getting a blood clot. This will help prevent a blood clot. Signs of a pulmonary embolus (blood clot in the lungs) include sudden short of breath, feeling lightheaded or dizzy, chest pain with a deep breath, rapid pulse rapid breathing. Signs of a blood clot in your arms or legs include new unexplained swelling and cramping, warm, red or  darkened skin around the painful area. Please call the office or 911 right away if these signs or symptoms develop.  PRECAUTIONS:  If you experience chest pain or shortness of breath - call 911 immediately for transfer to the hospital emergency department.   If you develop a fever greater that 101 F, purulent drainage from wound, increased redness or drainage from wound, foul odor from the wound/dressing, or calf pain - CONTACT YOUR SURGEON.                                                   FOLLOW-UP APPOINTMENTS:  If you do not already have a post-op appointment, please call the office for an appointment to be seen by your surgeon.  Guidelines for how soon to be seen are listed in your "After Visit Summary", but are typically between 2-3 weeks after surgery.  OTHER INSTRUCTIONS:   Knee Replacement:  Do not place pillow under knee, focus on keeping the knee straight while resting.  DO NOT modify, tear, cut, or change the foam block in any way.  POST-OPERATIVE OPIOID TAPER INSTRUCTIONS: It is important to wean off of your opioid medication as soon as possible. If you do not need pain medication after your surgery it is ok to stop day one. Opioids include: Codeine, Hydrocodone(Norco, Vicodin),  Oxycodone(Percocet, oxycontin) and hydromorphone amongst others.  Long term and even short term use of opiods can cause: Increased pain response Dependence Constipation Depression Respiratory depression And more.  Withdrawal symptoms can include Flu like symptoms Nausea, vomiting And more Techniques to manage these symptoms Hydrate well Eat regular healthy meals Stay active Use relaxation techniques(deep breathing, meditating, yoga) Do Not substitute Alcohol to help with tapering If you have been on opioids for less than two weeks and do not have pain than it is ok to stop all together.  Plan to wean off of opioids This plan should start within one week post op of your joint replacement. Maintain the same interval or time between taking each dose and first decrease the dose.  Cut the total daily intake of opioids by one tablet each day Next start to increase the time between doses. The last dose that should be eliminated is the evening dose.   MAKE SURE YOU:  Understand these instructions.  Get help right away if you are not doing well or get worse.    Thank you for letting us be a part of your medical care team.  It is a privilege we respect greatly.  We hope these instructions will help you stay on track for a fast and full recovery!           Signed: Latica Hohmann A Navil Kole 07/16/2022, 4:44 PM

## 2022-07-16 NOTE — Progress Notes (Signed)
Physical Therapy Treatment Patient Details Name: Amanda Bryant MRN: [id number] DOB: 04-27-1941 Today's Date: 07/16/2022   History of Present Illness 81 yo female s/p R TKA 07/15/22. Hx of HB, PVCs, pacemaker, shingles    PT Comments    Pt performed well. Pain controlled. Will plan to have a 2nd session prior to possible d/c home later today if pt feels ready.    Recommendations for follow up therapy are one component of a multi-disciplinary discharge planning process, led by the attending physician.  Recommendations may be updated based on patient status, additional functional criteria and insurance authorization.  Follow Up Recommendations  Follow physician's recommendations for discharge plan and follow up therapies     Assistance Recommended at Discharge Intermittent Supervision/Assistance  Patient can return home with the following A little help with walking and/or transfers;A little help with bathing/dressing/bathroom;Help with stairs or ramp for entrance;Assistance with cooking/housework;Assist for transportation   Equipment Recommendations  None recommended by PT    Recommendations for Other Services       Precautions / Restrictions Precautions Precautions: Fall;Knee Restrictions Weight Bearing Restrictions: No Other Position/Activity Restrictions: WBAT     Mobility  Bed Mobility Overal bed mobility: Modified Independent                  Transfers Overall transfer level: Needs assistance Equipment used: Rolling walker (2 wheels) Transfers: Sit to/from Stand Sit to Stand: Supervision           General transfer comment: Cues for safety, technique, hand/LE placement.    Ambulation/Gait Ambulation/Gait assistance: Min guard Gait Distance (Feet): 85 Feet Assistive device: Rolling walker (2 wheels) Gait Pattern/deviations: Step-through pattern, Decreased stride length       General Gait Details: Min guard for safesty. Steady with RW. No dizziness.  Tolerated distance well.   Stairs             Wheelchair Mobility    Modified Rankin (Stroke Patients Only)       Balance Overall balance assessment: Needs assistance         Standing balance support: Bilateral upper extremity supported, Reliant on assistive device for balance, During functional activity                                Cognition Arousal/Alertness: Awake/alert Behavior During Therapy: WFL for tasks assessed/performed Overall Cognitive Status: Within Functional Limits for tasks assessed                                          Exercises Total Joint Exercises Ankle Circles/Pumps: AROM, Both, 10 reps Quad Sets: AROM, Both, 10 reps Heel Slides: AAROM, Right, 10 reps Hip ABduction/ADduction: AAROM, Right, 10 reps Goniometric ROM: ~10-65 degrees    General Comments        Pertinent Vitals/Pain Pain Assessment Pain Assessment: Faces Faces Pain Scale: Hurts little more Pain Location: R knee;R heel Pain Descriptors / Indicators: Aching, Discomfort, Sore Pain Intervention(s): Monitored during session, Ice applied, Repositioned    Home Living                          Prior Function            PT Goals (current goals can now be found in the care plan section) Progress towards PT goals:  Progressing toward goals    Frequency    7X/week      PT Plan Current plan remains appropriate    Co-evaluation              AM-PAC PT "6 Clicks" Mobility   Outcome Measure  Help needed turning from your back to your side while in a flat bed without using bedrails?: None Help needed moving from lying on your back to sitting on the side of a flat bed without using bedrails?: None Help needed moving to and from a bed to a chair (including a wheelchair)?: A Little Help needed standing up from a chair using your arms (e.g., wheelchair or bedside chair)?: A Little Help needed to walk in hospital room?: A  Little Help needed climbing 3-5 steps with a railing? : A Little 6 Click Score: 20    End of Session Equipment Utilized During Treatment: Gait belt Activity Tolerance: Patient tolerated treatment well Patient left: in chair;with chair alarm set;with call bell/phone within reach   PT Visit Diagnosis: Pain;Other abnormalities of gait and mobility (R26.89) Pain - Right/Left: Right Pain - part of body: Knee     Time: 0100-7121 PT Time Calculation (min) (ACUTE ONLY): 22 min  Charges:  $Gait Training: 8-22 mins                        Doreatha Massed, PT Acute Rehabilitation  Office: [phone] Pager: [phone]

## 2022-07-16 NOTE — Discharge Instructions (Signed)

## 2022-07-16 NOTE — Progress Notes (Signed)
     Subjective:  Patient did very well yesterday after surgery.  She worked with physical therapy and walked 75 feet.  She was moving the leg very well.  She notes that she is much more sore this morning.  Discussed that this may be postoperative swelling, wearing off of the perioperative block, and also possible that she overdid it a little bit yesterday.  She agrees.  No other concerns or new issues.  We will work again with physical therapy today.  Discussed pending pain control and PT progress today possible discharge home later today versus tomorrow.  Objective:   VITALS:   Vitals:   07/15/22 2015 07/15/22 2236 07/16/22 0215 07/16/22 0524  BP: 138/76 (!) 146/57 (!) 140/58 135/62  Pulse: 72 (!) 55 71 64  Resp: '17 17 17 16  '$ Temp: 98.4 F (36.9 C) 98.6 F (37 C) 97.7 F (36.5 C) 98.4 F (36.9 C)  TempSrc: Oral Oral Oral   SpO2: 97% 97% 99% 98%  Weight:      Height:        Sensation intact distally Intact pulses distally Dorsiflexion/Plantar flexion intact Incision: dressing C/D/I Compartment soft   Lab Results  Component Value Date   WBC 15.8 (H) 07/16/2022   HGB 10.6 (L) 07/16/2022   HCT 34.0 (L) 07/16/2022   MCV 92.4 07/16/2022   PLT 180 07/16/2022   BMET    Component Value Date/Time   NA 138 07/16/2022 0317   K 4.0 07/16/2022 0317   CL 105 07/16/2022 0317   CO2 26 07/16/2022 0317   GLUCOSE 130 (H) 07/16/2022 0317   BUN 16 07/16/2022 0317   CREATININE 0.49 07/16/2022 0317   CREATININE 0.80 06/04/2016 1622   CALCIUM 8.7 (L) 07/16/2022 0317   GFRNONAA >60 07/16/2022 0317      Xray: PACU x-rays demonstrate comminuted breast components good position no adverse features  Assessment/Plan: 1 Day Post-Op   Principal Problem:   Localized osteoarthritis of right knee  S/p R TKA 10/16  Post op recs: WB: WBAT Abx: ancef Imaging: PACU xrays DVT prophylaxis: Aspirin '81mg'$  BID x4 weeks Follow up: 2 weeks after surgery for a wound check with Dr. Zachery Dakins  at [facility].  Address: [address] [place], [place], [place] [address]  Office Phone: [phone]   Charlies Constable, MD Orthopaedic Surgery  Silvie Obremski A Gregery Walberg 07/16/2022, 6:31 AM   Charlies Constable, MD  Contact information:   [phone] 7am-5pm epic message Dr. Zachery Dakins, or call office for patient follow up: [phone] After hours and holidays please check [email or web address] for group call information for [facility]

## 2022-07-17 DIAGNOSIS — Z96651 Presence of right artificial knee joint: Secondary | ICD-10-CM | POA: Diagnosis not present

## 2022-07-17 DIAGNOSIS — Z9181 History of falling: Secondary | ICD-10-CM | POA: Diagnosis not present

## 2022-07-17 DIAGNOSIS — Z950 Presence of cardiac pacemaker: Secondary | ICD-10-CM | POA: Diagnosis not present

## 2022-07-17 DIAGNOSIS — Z471 Aftercare following joint replacement surgery: Secondary | ICD-10-CM | POA: Diagnosis not present

## 2022-07-17 DIAGNOSIS — I10 Essential (primary) hypertension: Secondary | ICD-10-CM | POA: Diagnosis not present

## 2022-07-17 DIAGNOSIS — B029 Zoster without complications: Secondary | ICD-10-CM | POA: Diagnosis not present

## 2022-07-18 ENCOUNTER — Ambulatory Visit: Payer: Medicare Other | Admitting: Gastroenterology

## 2022-07-29 DIAGNOSIS — L814 Other melanin hyperpigmentation: Secondary | ICD-10-CM | POA: Diagnosis not present

## 2022-07-29 DIAGNOSIS — L821 Other seborrheic keratosis: Secondary | ICD-10-CM | POA: Diagnosis not present

## 2022-07-29 DIAGNOSIS — L570 Actinic keratosis: Secondary | ICD-10-CM | POA: Diagnosis not present

## 2022-07-29 DIAGNOSIS — L578 Other skin changes due to chronic exposure to nonionizing radiation: Secondary | ICD-10-CM | POA: Diagnosis not present

## 2022-07-30 DIAGNOSIS — M1711 Unilateral primary osteoarthritis, right knee: Secondary | ICD-10-CM | POA: Diagnosis not present

## 2022-08-05 DIAGNOSIS — M1711 Unilateral primary osteoarthritis, right knee: Secondary | ICD-10-CM | POA: Diagnosis not present

## 2022-08-05 DIAGNOSIS — M62551 Muscle wasting and atrophy, not elsewhere classified, right thigh: Secondary | ICD-10-CM | POA: Diagnosis not present

## 2022-08-05 DIAGNOSIS — Z96651 Presence of right artificial knee joint: Secondary | ICD-10-CM | POA: Diagnosis not present

## 2022-08-05 DIAGNOSIS — R2689 Other abnormalities of gait and mobility: Secondary | ICD-10-CM | POA: Diagnosis not present

## 2022-08-05 DIAGNOSIS — M25561 Pain in right knee: Secondary | ICD-10-CM | POA: Diagnosis not present

## 2022-08-08 DIAGNOSIS — M62551 Muscle wasting and atrophy, not elsewhere classified, right thigh: Secondary | ICD-10-CM | POA: Diagnosis not present

## 2022-08-08 DIAGNOSIS — R2689 Other abnormalities of gait and mobility: Secondary | ICD-10-CM | POA: Diagnosis not present

## 2022-08-08 DIAGNOSIS — M25561 Pain in right knee: Secondary | ICD-10-CM | POA: Diagnosis not present

## 2022-08-08 DIAGNOSIS — Z96651 Presence of right artificial knee joint: Secondary | ICD-10-CM | POA: Diagnosis not present

## 2022-08-12 DIAGNOSIS — R2689 Other abnormalities of gait and mobility: Secondary | ICD-10-CM | POA: Diagnosis not present

## 2022-08-12 DIAGNOSIS — M25561 Pain in right knee: Secondary | ICD-10-CM | POA: Diagnosis not present

## 2022-08-12 DIAGNOSIS — M62551 Muscle wasting and atrophy, not elsewhere classified, right thigh: Secondary | ICD-10-CM | POA: Diagnosis not present

## 2022-08-12 DIAGNOSIS — Z96651 Presence of right artificial knee joint: Secondary | ICD-10-CM | POA: Diagnosis not present

## 2022-08-15 DIAGNOSIS — R2689 Other abnormalities of gait and mobility: Secondary | ICD-10-CM | POA: Diagnosis not present

## 2022-08-15 DIAGNOSIS — M62551 Muscle wasting and atrophy, not elsewhere classified, right thigh: Secondary | ICD-10-CM | POA: Diagnosis not present

## 2022-08-15 DIAGNOSIS — M25561 Pain in right knee: Secondary | ICD-10-CM | POA: Diagnosis not present

## 2022-08-15 DIAGNOSIS — Z96651 Presence of right artificial knee joint: Secondary | ICD-10-CM | POA: Diagnosis not present

## 2022-08-19 ENCOUNTER — Ambulatory Visit (INDEPENDENT_AMBULATORY_CARE_PROVIDER_SITE_OTHER): Payer: Medicare Other

## 2022-08-19 DIAGNOSIS — I442 Atrioventricular block, complete: Secondary | ICD-10-CM | POA: Diagnosis not present

## 2022-08-19 DIAGNOSIS — M62551 Muscle wasting and atrophy, not elsewhere classified, right thigh: Secondary | ICD-10-CM | POA: Diagnosis not present

## 2022-08-19 DIAGNOSIS — R2689 Other abnormalities of gait and mobility: Secondary | ICD-10-CM | POA: Diagnosis not present

## 2022-08-19 DIAGNOSIS — M25561 Pain in right knee: Secondary | ICD-10-CM | POA: Diagnosis not present

## 2022-08-19 DIAGNOSIS — Z96651 Presence of right artificial knee joint: Secondary | ICD-10-CM | POA: Diagnosis not present

## 2022-08-21 DIAGNOSIS — M25561 Pain in right knee: Secondary | ICD-10-CM | POA: Diagnosis not present

## 2022-08-21 DIAGNOSIS — M62551 Muscle wasting and atrophy, not elsewhere classified, right thigh: Secondary | ICD-10-CM | POA: Diagnosis not present

## 2022-08-21 DIAGNOSIS — R2689 Other abnormalities of gait and mobility: Secondary | ICD-10-CM | POA: Diagnosis not present

## 2022-08-21 DIAGNOSIS — Z96651 Presence of right artificial knee joint: Secondary | ICD-10-CM | POA: Diagnosis not present

## 2022-08-21 LAB — CUP PACEART REMOTE DEVICE CHECK
Battery Remaining Longevity: 44 mo
Battery Voltage: 2.97 V
Brady Statistic AP VP Percent: 1.55 %
Brady Statistic AP VS Percent: 0.01 %
Brady Statistic AS VP Percent: 95.63 %
Brady Statistic AS VS Percent: 2.81 %
Brady Statistic RA Percent Paced: 1.54 %
Brady Statistic RV Percent Paced: 96.24 %
Date Time Interrogation Session: 20231121171736
Implantable Lead Connection Status: 753985
Implantable Lead Connection Status: 753985
Implantable Lead Implant Date: 20081014
Implantable Lead Implant Date: 20081014
Implantable Lead Location: 753859
Implantable Lead Location: 753860
Implantable Lead Model: 5076
Implantable Lead Model: 5076
Implantable Pulse Generator Implant Date: 20170911
Lead Channel Impedance Value: 399 Ohm
Lead Channel Impedance Value: 437 Ohm
Lead Channel Impedance Value: 456 Ohm
Lead Channel Impedance Value: 589 Ohm
Lead Channel Pacing Threshold Amplitude: 0.375 V
Lead Channel Pacing Threshold Amplitude: 0.75 V
Lead Channel Pacing Threshold Pulse Width: 0.4 ms
Lead Channel Pacing Threshold Pulse Width: 0.4 ms
Lead Channel Sensing Intrinsic Amplitude: 1.25 mV
Lead Channel Sensing Intrinsic Amplitude: 1.25 mV
Lead Channel Sensing Intrinsic Amplitude: 14.5 mV
Lead Channel Sensing Intrinsic Amplitude: 14.5 mV
Lead Channel Setting Pacing Amplitude: 2 V
Lead Channel Setting Pacing Amplitude: 2.5 V
Lead Channel Setting Pacing Pulse Width: 0.4 ms
Lead Channel Setting Sensing Sensitivity: 5.6 mV
Zone Setting Status: 755011
Zone Setting Status: 755011

## 2022-08-26 DIAGNOSIS — M62551 Muscle wasting and atrophy, not elsewhere classified, right thigh: Secondary | ICD-10-CM | POA: Diagnosis not present

## 2022-08-26 DIAGNOSIS — Z96651 Presence of right artificial knee joint: Secondary | ICD-10-CM | POA: Diagnosis not present

## 2022-08-26 DIAGNOSIS — R2689 Other abnormalities of gait and mobility: Secondary | ICD-10-CM | POA: Diagnosis not present

## 2022-08-26 DIAGNOSIS — M25561 Pain in right knee: Secondary | ICD-10-CM | POA: Diagnosis not present

## 2022-08-29 DIAGNOSIS — M1711 Unilateral primary osteoarthritis, right knee: Secondary | ICD-10-CM | POA: Diagnosis not present

## 2022-09-27 NOTE — Progress Notes (Signed)
Remote pacemaker transmission.   

## 2022-10-01 DIAGNOSIS — M19031 Primary osteoarthritis, right wrist: Secondary | ICD-10-CM | POA: Diagnosis not present

## 2022-10-01 DIAGNOSIS — M24131 Other articular cartilage disorders, right wrist: Secondary | ICD-10-CM | POA: Diagnosis not present

## 2022-10-16 DIAGNOSIS — Z4789 Encounter for other orthopedic aftercare: Secondary | ICD-10-CM | POA: Diagnosis not present

## 2022-10-16 DIAGNOSIS — M25531 Pain in right wrist: Secondary | ICD-10-CM | POA: Diagnosis not present

## 2022-10-16 DIAGNOSIS — M13831 Other specified arthritis, right wrist: Secondary | ICD-10-CM | POA: Diagnosis not present

## 2022-10-16 DIAGNOSIS — M1811 Unilateral primary osteoarthritis of first carpometacarpal joint, right hand: Secondary | ICD-10-CM | POA: Diagnosis not present

## 2022-10-17 DIAGNOSIS — M1711 Unilateral primary osteoarthritis, right knee: Secondary | ICD-10-CM | POA: Diagnosis not present

## 2022-10-30 DIAGNOSIS — Z4789 Encounter for other orthopedic aftercare: Secondary | ICD-10-CM | POA: Diagnosis not present

## 2022-11-07 DIAGNOSIS — M1811 Unilateral primary osteoarthritis of first carpometacarpal joint, right hand: Secondary | ICD-10-CM | POA: Diagnosis not present

## 2022-11-18 ENCOUNTER — Ambulatory Visit (INDEPENDENT_AMBULATORY_CARE_PROVIDER_SITE_OTHER): Payer: Medicare Other

## 2022-11-18 DIAGNOSIS — I442 Atrioventricular block, complete: Secondary | ICD-10-CM

## 2022-11-18 DIAGNOSIS — Z4789 Encounter for other orthopedic aftercare: Secondary | ICD-10-CM | POA: Diagnosis not present

## 2022-11-20 LAB — CUP PACEART REMOTE DEVICE CHECK
Battery Remaining Longevity: 39 mo
Battery Voltage: 2.97 V
Brady Statistic AP VP Percent: 1.27 %
Brady Statistic AP VS Percent: 0.02 %
Brady Statistic AS VP Percent: 95.91 %
Brady Statistic AS VS Percent: 2.8 %
Brady Statistic RA Percent Paced: 1.28 %
Brady Statistic RV Percent Paced: 96.63 %
Date Time Interrogation Session: 20240221141537
Implantable Lead Connection Status: 753985
Implantable Lead Connection Status: 753985
Implantable Lead Implant Date: 20081014
Implantable Lead Implant Date: 20081014
Implantable Lead Location: 753859
Implantable Lead Location: 753860
Implantable Lead Model: 5076
Implantable Lead Model: 5076
Implantable Pulse Generator Implant Date: 20170911
Lead Channel Impedance Value: 399 Ohm
Lead Channel Impedance Value: 456 Ohm
Lead Channel Impedance Value: 456 Ohm
Lead Channel Impedance Value: 570 Ohm
Lead Channel Pacing Threshold Amplitude: 0.5 V
Lead Channel Pacing Threshold Amplitude: 0.625 V
Lead Channel Pacing Threshold Pulse Width: 0.4 ms
Lead Channel Pacing Threshold Pulse Width: 0.4 ms
Lead Channel Sensing Intrinsic Amplitude: 1.125 mV
Lead Channel Sensing Intrinsic Amplitude: 1.125 mV
Lead Channel Sensing Intrinsic Amplitude: 11.5 mV
Lead Channel Sensing Intrinsic Amplitude: 11.5 mV
Lead Channel Setting Pacing Amplitude: 2 V
Lead Channel Setting Pacing Amplitude: 2.5 V
Lead Channel Setting Pacing Pulse Width: 0.4 ms
Lead Channel Setting Sensing Sensitivity: 5.6 mV
Zone Setting Status: 755011
Zone Setting Status: 755011

## 2022-11-26 DIAGNOSIS — M1711 Unilateral primary osteoarthritis, right knee: Secondary | ICD-10-CM | POA: Diagnosis not present

## 2022-11-28 DIAGNOSIS — E785 Hyperlipidemia, unspecified: Secondary | ICD-10-CM | POA: Diagnosis not present

## 2022-11-28 DIAGNOSIS — E559 Vitamin D deficiency, unspecified: Secondary | ICD-10-CM | POA: Diagnosis not present

## 2022-11-28 DIAGNOSIS — R7301 Impaired fasting glucose: Secondary | ICD-10-CM | POA: Diagnosis not present

## 2022-11-28 DIAGNOSIS — I10 Essential (primary) hypertension: Secondary | ICD-10-CM | POA: Diagnosis not present

## 2022-12-02 DIAGNOSIS — M13831 Other specified arthritis, right wrist: Secondary | ICD-10-CM | POA: Diagnosis not present

## 2022-12-02 DIAGNOSIS — M1811 Unilateral primary osteoarthritis of first carpometacarpal joint, right hand: Secondary | ICD-10-CM | POA: Diagnosis not present

## 2022-12-02 DIAGNOSIS — Z4789 Encounter for other orthopedic aftercare: Secondary | ICD-10-CM | POA: Diagnosis not present

## 2022-12-23 DIAGNOSIS — T85698A Other mechanical complication of other specified internal prosthetic devices, implants and grafts, initial encounter: Secondary | ICD-10-CM | POA: Diagnosis not present

## 2022-12-23 DIAGNOSIS — M13831 Other specified arthritis, right wrist: Secondary | ICD-10-CM | POA: Diagnosis not present

## 2022-12-23 DIAGNOSIS — Z4789 Encounter for other orthopedic aftercare: Secondary | ICD-10-CM | POA: Diagnosis not present

## 2022-12-24 DIAGNOSIS — M542 Cervicalgia: Secondary | ICD-10-CM | POA: Diagnosis not present

## 2022-12-27 ENCOUNTER — Ambulatory Visit: Admit: 2022-12-27 | Payer: Medicare Other | Admitting: Orthopedic Surgery

## 2022-12-27 DIAGNOSIS — T84038A Mechanical loosening of other internal prosthetic joint, initial encounter: Secondary | ICD-10-CM | POA: Diagnosis not present

## 2022-12-27 DIAGNOSIS — M25531 Pain in right wrist: Secondary | ICD-10-CM | POA: Diagnosis not present

## 2022-12-27 DIAGNOSIS — M65831 Other synovitis and tenosynovitis, right forearm: Secondary | ICD-10-CM | POA: Diagnosis not present

## 2022-12-27 DIAGNOSIS — M659 Synovitis and tenosynovitis, unspecified: Secondary | ICD-10-CM | POA: Diagnosis not present

## 2022-12-27 DIAGNOSIS — M24831 Other specific joint derangements of right wrist, not elsewhere classified: Secondary | ICD-10-CM | POA: Diagnosis not present

## 2022-12-27 DIAGNOSIS — T8489XA Other specified complication of internal orthopedic prosthetic devices, implants and grafts, initial encounter: Secondary | ICD-10-CM | POA: Diagnosis not present

## 2022-12-27 SURGERY — REMOVAL, HARDWARE
Anesthesia: Regional | Site: Wrist | Laterality: Right

## 2023-01-02 NOTE — Progress Notes (Signed)
Remote pacemaker transmission.   

## 2023-01-07 DIAGNOSIS — D485 Neoplasm of uncertain behavior of skin: Secondary | ICD-10-CM | POA: Diagnosis not present

## 2023-01-09 DIAGNOSIS — Z4789 Encounter for other orthopedic aftercare: Secondary | ICD-10-CM | POA: Diagnosis not present

## 2023-01-20 ENCOUNTER — Telehealth: Payer: Self-pay | Admitting: Internal Medicine

## 2023-01-20 NOTE — Telephone Encounter (Signed)
Pt states that she was trying to do a home remote pacer check on Saturday 4/20, but the lights were going crazy. Advised the pt that the next home remote pacer check isn't until 5/20. She is still worried about the lights.

## 2023-01-21 DIAGNOSIS — J301 Allergic rhinitis due to pollen: Secondary | ICD-10-CM | POA: Diagnosis not present

## 2023-01-23 DIAGNOSIS — M65831 Other synovitis and tenosynovitis, right forearm: Secondary | ICD-10-CM | POA: Diagnosis not present

## 2023-01-23 DIAGNOSIS — Z4789 Encounter for other orthopedic aftercare: Secondary | ICD-10-CM | POA: Diagnosis not present

## 2023-01-24 NOTE — Telephone Encounter (Signed)
I called [employer] tech support to help the patient trouble shoot her monitor.

## 2023-01-24 NOTE — Telephone Encounter (Signed)
[employer] is sending her a new handheld. She should receive it in 7-10 business days.

## 2023-01-29 DIAGNOSIS — M6281 Muscle weakness (generalized): Secondary | ICD-10-CM | POA: Diagnosis not present

## 2023-01-29 DIAGNOSIS — M25531 Pain in right wrist: Secondary | ICD-10-CM | POA: Diagnosis not present

## 2023-01-31 DIAGNOSIS — M25531 Pain in right wrist: Secondary | ICD-10-CM | POA: Diagnosis not present

## 2023-01-31 DIAGNOSIS — M6281 Muscle weakness (generalized): Secondary | ICD-10-CM | POA: Diagnosis not present

## 2023-02-05 DIAGNOSIS — R634 Abnormal weight loss: Secondary | ICD-10-CM | POA: Diagnosis not present

## 2023-02-05 DIAGNOSIS — R1084 Generalized abdominal pain: Secondary | ICD-10-CM | POA: Diagnosis not present

## 2023-02-05 DIAGNOSIS — R110 Nausea: Secondary | ICD-10-CM | POA: Diagnosis not present

## 2023-02-06 DIAGNOSIS — M6281 Muscle weakness (generalized): Secondary | ICD-10-CM | POA: Diagnosis not present

## 2023-02-06 DIAGNOSIS — M25531 Pain in right wrist: Secondary | ICD-10-CM | POA: Diagnosis not present

## 2023-02-06 LAB — LAB REPORT - SCANNED: EGFR: 76

## 2023-02-10 DIAGNOSIS — R109 Unspecified abdominal pain: Secondary | ICD-10-CM | POA: Diagnosis not present

## 2023-02-10 DIAGNOSIS — R1084 Generalized abdominal pain: Secondary | ICD-10-CM | POA: Diagnosis not present

## 2023-02-10 DIAGNOSIS — I700 Atherosclerosis of aorta: Secondary | ICD-10-CM | POA: Diagnosis not present

## 2023-02-12 DIAGNOSIS — K080 Exfoliation of teeth due to systemic causes: Secondary | ICD-10-CM | POA: Diagnosis not present

## 2023-02-13 DIAGNOSIS — M25531 Pain in right wrist: Secondary | ICD-10-CM | POA: Diagnosis not present

## 2023-02-13 DIAGNOSIS — M6281 Muscle weakness (generalized): Secondary | ICD-10-CM | POA: Diagnosis not present

## 2023-02-17 ENCOUNTER — Ambulatory Visit (INDEPENDENT_AMBULATORY_CARE_PROVIDER_SITE_OTHER): Payer: Medicare Other

## 2023-02-17 DIAGNOSIS — I442 Atrioventricular block, complete: Secondary | ICD-10-CM

## 2023-02-19 DIAGNOSIS — M13831 Other specified arthritis, right wrist: Secondary | ICD-10-CM | POA: Diagnosis not present

## 2023-02-19 LAB — CUP PACEART REMOTE DEVICE CHECK
Battery Remaining Longevity: 34 mo
Battery Voltage: 2.96 V
Brady Statistic AP VP Percent: 2.44 %
Brady Statistic AP VS Percent: 0 %
Brady Statistic AS VP Percent: 95.62 %
Brady Statistic AS VS Percent: 1.94 %
Brady Statistic RA Percent Paced: 2.42 %
Brady Statistic RV Percent Paced: 97.11 %
Date Time Interrogation Session: 20240521224224
Implantable Lead Connection Status: 753985
Implantable Lead Connection Status: 753985
Implantable Lead Implant Date: 20081014
Implantable Lead Implant Date: 20081014
Implantable Lead Location: 753859
Implantable Lead Location: 753860
Implantable Lead Model: 5076
Implantable Lead Model: 5076
Implantable Pulse Generator Implant Date: 20170911
Lead Channel Impedance Value: 418 Ohm
Lead Channel Impedance Value: 456 Ohm
Lead Channel Impedance Value: 513 Ohm
Lead Channel Impedance Value: 646 Ohm
Lead Channel Pacing Threshold Amplitude: 0.5 V
Lead Channel Pacing Threshold Amplitude: 0.875 V
Lead Channel Pacing Threshold Pulse Width: 0.4 ms
Lead Channel Pacing Threshold Pulse Width: 0.4 ms
Lead Channel Sensing Intrinsic Amplitude: 1.375 mV
Lead Channel Sensing Intrinsic Amplitude: 1.375 mV
Lead Channel Sensing Intrinsic Amplitude: 15.875 mV
Lead Channel Sensing Intrinsic Amplitude: 15.875 mV
Lead Channel Setting Pacing Amplitude: 2 V
Lead Channel Setting Pacing Amplitude: 2.5 V
Lead Channel Setting Pacing Pulse Width: 0.4 ms
Lead Channel Setting Sensing Sensitivity: 5.6 mV
Zone Setting Status: 755011
Zone Setting Status: 755011

## 2023-03-05 DIAGNOSIS — K080 Exfoliation of teeth due to systemic causes: Secondary | ICD-10-CM | POA: Diagnosis not present

## 2023-03-18 NOTE — Progress Notes (Signed)
Remote pacemaker transmission.   

## 2023-03-19 ENCOUNTER — Ambulatory Visit: Payer: Medicare Other | Admitting: Gastroenterology

## 2023-03-19 ENCOUNTER — Encounter: Payer: Self-pay | Admitting: Gastroenterology

## 2023-03-19 VITALS — BP 138/84 | HR 67 | Ht 65.0 in | Wt 127.6 lb

## 2023-03-19 DIAGNOSIS — R110 Nausea: Secondary | ICD-10-CM | POA: Diagnosis not present

## 2023-03-19 DIAGNOSIS — R1011 Right upper quadrant pain: Secondary | ICD-10-CM | POA: Diagnosis not present

## 2023-03-19 MED ORDER — PROMETHAZINE HCL 25 MG PO TABS: 25.0000 mg | ORAL_TABLET | Freq: Three times a day (TID) | ORAL | 2 refills | Status: AC | PRN

## 2023-03-19 NOTE — Patient Instructions (Addendum)
_______________________________________________________  If your blood pressure at your visit was 140/90 or greater, please contact your primary care physician to follow up on this.  _______________________________________________________  If you are age 82 or older, your body mass index should be between 23-30. Your Body mass index is 21.23 kg/m. If this is out of the aforementioned range listed, please consider follow up with your Primary Care Provider.  If you are age 21 or younger, your body mass index should be between 19-25. Your Body mass index is 21.23 kg/m. If this is out of the aformentioned range listed, please consider follow up with your Primary Care Provider.   ________________________________________________________  The [facility] providers would like to encourage you to use [facility] to communicate with providers for non-urgent requests or questions.  Due to long hold times on the telephone, sending your provider a message by [facility] may be a faster and more efficient way to get a response.  Please allow 48 business hours for a response.  Please remember that this is for non-urgent requests.  _______________________________________________________  We have sent the following medications to your pharmacy for you to pick up at your convenience: Phenergan  Small and frequent meals  You have been scheduled for an appointment with Dr. Chales Abrahams on  06-10-2023 at 830am . Please arrive 10 minutes early for your appointment.  Thank you,  Dr. Lynann Bologna

## 2023-03-19 NOTE — Progress Notes (Signed)
Chief Complaint: Abdominal pain  Referring Provider:  Paulina Fusi, MD      ASSESSMENT AND PLAN;   #1. RUQ abdominal pain (positive carnett sign) with episodic nausea. Pt is s/p chole in past and neg ERCP as below.  H/O GERD on chronic omeprazole. Neg EGD 07/2019 for etiology. Neg CT 06/2019(Cannot do MRCP due to pacemaker). Neg GES 08/2019. Neg [place] 2023 except for fatty liver.  #2. Wt loss (Oct 2023 R knee replacement). Neg recent CT AP 01/2023  #3. H/O Elevated alkaline phosphatase (resolved - normal alk phos at 63 05/2019). H/O cholecystectomy.  S/P ERCP 04/26/2015-Nl cholangiogram, sphincterotomy at [facility].  #4. GERD with small HH   Plan: -Blood test results from Dr Tomasa Blase -Continue omeprazole 20 mg p.o. QD -Phenergan 25mg  po Q8hrs #30, 2RF fall precautions. -Small and frequent meals -FU in 8 weeks. If any further wt loss, then EGD/ +/- colon. -If still with problems, will perform further work-up.   HPI:    Amanda Bryant is a 82 y.o. female  For follow-up visit  Still has "chronic right upper quadrant abdominal pain".  She describes occasional exacerbation which she describes as "attacks".  She has nausea but no vomiting.  No jaundice dark urine or pale stools.  No fever chills or night sweats.  Has lost wt  Had normal solid-phase gastric emptying scan  Phenergan works well for nausea.  Could not identify any exacerbating factors or any fatty food intolerance.  Has been having regular bowel movements ever since she eats fig/day.  Wt Readings from Last 3 Encounters:  03/19/23 127 lb 9.6 oz (57.9 kg)  07/15/22 140 lb (63.5 kg)  07/03/22 140 lb (63.5 kg)     Past GI procedures:  CT AP with contrast 01/2023 IMPRESSION: No acute findings or other significant abnormality. No radiographic evidence of malignancy. Aortic Atherosclerosis (ICD10-I70.0).   -Colonoscopy 07/2011 6 mm colon polyp s/p polypectomy, mild sigmoid diverticulosis, int Hoids  Bx-hyperplastic. -EGD 07/01/2019 2 cm hiatal hernia, moderate gastritis, duodenal diverticulum. Bx - neg for celiac/HP.  Negative small bowel biopsies. -CT AP with contrast 06/2019: No acute abnormalities. ?thickening of the duodenum/jejunum, cholecystectomy with pneumobilia (prev ERCP) -GES 08/2019- Nl Past Medical History:  Diagnosis Date   Arthritis    Complete heart block (HCC)    intermittent, documented by prev ILR   GERD (gastroesophageal reflux disease)    HTN (hypertension)    Pacemaker    Implanted 2008   PONV (postoperative nausea and vomiting)    PVC's (premature ventricular contractions)    S/P cardiac catheterization    2001- nonobstructive disease; nl lv function   Shingles     Past Surgical History:  Procedure Laterality Date   BACK SURGERY  2010   CESAREAN SECTION  1967   CESAREAN SECTION  1972   CHOLECYSTECTOMY     COLONOSCOPY  07/22/2011   Colonic Polyp, mild sigmoid diverticulosis, small internal hemorrhoids. Bx: hyperplastic polyps.   EP IMPLANTABLE DEVICE N/A 06/10/2016   Procedure: PPM Generator Changeout;  Surgeon: Will Jorja Loa, MD;  [facility];  Service: Cardiovascular;  Laterality: N/A;   ERCP  04/26/2015   Normal cholangiogram- no hilar/righthepatic duct stricture visualized.   TOTAL KNEE ARTHROPLASTY Right 07/15/2022   Procedure: TOTAL KNEE ARTHROPLASTY;  Surgeon: Joen Laura, MD;  Location: [facility];  Service: Orthopedics;  Laterality: Right;    Family History  Problem Relation Age of Onset   Heart disease Mother  Diabetes Mother    Diabetes Father    Heart disease Father    Colon cancer Neg Hx    Esophageal cancer Neg Hx    Rectal cancer Neg Hx    Stomach cancer Neg Hx     Social History   Tobacco Use   Smoking status: Never   Smokeless tobacco: Never  Vaping Use   Vaping Use: Never used  Substance Use Topics   Alcohol use: No    Alcohol/week: 0.0 standard drinks of alcohol   Drug use: No     Current Outpatient Medications  Medication Sig Dispense Refill   acetaminophen (TYLENOL) 650 MG CR tablet Take 650-1,300 mg by mouth every 8 (eight) hours as needed for pain.     albuterol (VENTOLIN HFA) 108 (90 Base) MCG/ACT inhaler Inhale 2 puffs into the lungs every 6 (six) hours as needed for wheezing or shortness of breath.     alendronate (FOSAMAX) 70 MG tablet Take 70 mg by mouth every Saturday. Take with a full glass of water on an empty stomach.     Calcium Carbonate (CALTRATE 600 PO) Take 1 tablet by mouth daily.      carboxymethylcellulose (REFRESH PLUS) 0.5 % SOLN Place 1 drop into both eyes 3 (three) times daily as needed (dry eyes).     carvedilol (COREG) 6.25 MG tablet Take 6.25 mg by mouth 2 (two) times daily.     Cholecalciferol (VITAMIN D) 1000 UNITS capsule Take 1,000 Units by mouth daily.       fish oil-omega-3 fatty acids 1000 MG capsule Take 2 g by mouth daily.       furosemide (LASIX) 20 MG tablet Take 20-40 mg by mouth 2 (two) times daily.     Homeopathic Products (LEG CRAMPS) SUBL Place 1 tablet under the tongue as needed (leg cramps). Hyland's     losartan (COZAAR) 50 MG tablet Take 50 mg by mouth daily.     Misc Natural Products (GLUCOSAMINE CHOND MSM FORMULA PO) Take 2 tablets by mouth daily.     Multiple Vitamins-Minerals (MULTIVITAMIN WITH MINERALS) tablet Take 1 tablet by mouth daily.     omeprazole (PRILOSEC) 20 MG capsule Take 20 mg by mouth daily.       promethazine (PHENERGAN) 25 MG tablet Take 1 tablet (25 mg total) by mouth every 8 (eight) hours as needed for nausea or vomiting. 30 tablet 2   rOPINIRole (REQUIP) 4 MG tablet Take 4 mg by mouth at bedtime.     simvastatin (ZOCOR) 20 MG tablet Take 20 mg by mouth daily.      Turmeric 500 MG CAPS Take 500 mg by mouth daily.     venlafaxine XR (EFFEXOR-XR) 75 MG 24 hr capsule Take 75 mg by mouth daily with breakfast.     zinc gluconate 50 MG tablet Take 50 mg by mouth daily.     amoxicillin (AMOXIL) 500 MG  capsule Take 2,000 mg by mouth See admin instructions. Take 1 hour before dental procedures (Patient not taking: Reported on 03/19/2023)     ciclopirox (PENLAC) 8 % solution Apply 1 application  topically daily. (Patient not taking: Reported on 03/19/2023)     ibuprofen (ADVIL) 200 MG tablet Take 4 tablets (800 mg total) by mouth every 8 (eight) hours as needed for moderate pain. (Patient not taking: Reported on 03/19/2023) 30 tablet 0   Current Facility-Administered Medications  Medication Dose Route Frequency Provider Last Rate Last Admin   0.9 %  sodium chloride infusion  500 mL Intravenous Once Lynann Bologna, MD        Allergies  Allergen Reactions   Codeine Nausea Only   Prednisone Palpitations and Other (See Comments)    Reaction: Increased heart rate   Ultram [Tramadol Hcl] Itching and Nausea And Vomiting   Tape     Pulls skin off    Prednisolone Acetate Palpitations    Review of Systems:  neg     Physical Exam:    BP 138/84   Pulse 67   Ht 5\' 5"  (1.651 m)   Wt 127 lb 9.6 oz (57.9 kg)   SpO2 98%   BMI 21.23 kg/m  Filed Weights   03/19/23 0828  Weight: 127 lb 9.6 oz (57.9 kg)   Gen: awake, alert, NAD HEENT: anicteric, no pallor CV: RRR, no mrg Pulm: CTA b/l Abd: soft, NT/ND, +BS throughout Ext: no c/c/e Neuro: nonfocal   Data Reviewed: I have personally reviewed following labs and imaging studies  CBC:    Latest Ref Rng & Units 07/16/2022    3:17 AM 07/03/2022   10:30 AM 05/22/2022   11:40 AM  CBC  WBC 4.0 - 10.5 K/uL 15.8  8.6  7.4   Hemoglobin 12.0 - 15.0 g/dL 16.1  09.6  04.5   Hematocrit 36.0 - 46.0 % 34.0  40.4  36.4   Platelets 150 - 400 K/uL 180  184  157.0     CMP:    Latest Ref Rng & Units 07/16/2022    3:17 AM 07/03/2022   10:30 AM 05/22/2022   11:40 AM  CMP  Glucose 70 - 99 mg/dL 409  811  92   BUN 8 - 23 mg/dL 16  18  19    Creatinine 0.44 - 1.00 mg/dL 9.14  7.82  9.56   Sodium 135 - 145 mmol/L 138  140  139   Potassium 3.5 - 5.1  mmol/L 4.0  4.6  4.2   Chloride 98 - 111 mmol/L 105  106  107   CO2 22 - 32 mmol/L 26  27  29    Calcium 8.9 - 10.3 mg/dL 8.7  9.1  8.8   Total Protein 6.0 - 8.3 g/dL   6.3   Total Bilirubin 0.2 - 1.2 mg/dL   0.4   Alkaline Phos 39 - 117 U/L   71   AST 0 - 37 U/L   20   ALT 0 - 35 U/L   18       Latest Ref Rng & Units 05/22/2022   11:40 AM 03/05/2018   12:04 PM 12/01/2007   11:10 AM  Hepatic Function  Total Protein 6.0 - 8.3 g/dL 6.3  6.4  6.5   Albumin 3.5 - 5.2 g/dL 3.8  4.0  3.6   AST 0 - 37 U/L 20  35  30   ALT 0 - 35 U/L 18  30  33   Alk Phosphatase 39 - 117 U/L 71  75  59   Total Bilirubin 0.2 - 1.2 mg/dL 0.4  0.4  0.6          Edman Circle, MD 03/19/2023, 8:49 AM  Cc: Paulina Fusi, MD

## 2023-03-21 DIAGNOSIS — F3342 Major depressive disorder, recurrent, in full remission: Secondary | ICD-10-CM | POA: Diagnosis not present

## 2023-03-21 DIAGNOSIS — M1990 Unspecified osteoarthritis, unspecified site: Secondary | ICD-10-CM | POA: Diagnosis not present

## 2023-03-24 DIAGNOSIS — Z4789 Encounter for other orthopedic aftercare: Secondary | ICD-10-CM | POA: Diagnosis not present

## 2023-03-31 DIAGNOSIS — Z01419 Encounter for gynecological examination (general) (routine) without abnormal findings: Secondary | ICD-10-CM | POA: Diagnosis not present

## 2023-03-31 DIAGNOSIS — Z6821 Body mass index (BMI) 21.0-21.9, adult: Secondary | ICD-10-CM | POA: Diagnosis not present

## 2023-04-01 ENCOUNTER — Ambulatory Visit: Payer: Medicare Other | Attending: Internal Medicine | Admitting: Internal Medicine

## 2023-04-01 ENCOUNTER — Encounter: Payer: Self-pay | Admitting: Internal Medicine

## 2023-04-01 VITALS — BP 138/74 | HR 53 | Ht 65.0 in | Wt 127.6 lb

## 2023-04-01 DIAGNOSIS — Z950 Presence of cardiac pacemaker: Secondary | ICD-10-CM

## 2023-04-01 DIAGNOSIS — I442 Atrioventricular block, complete: Secondary | ICD-10-CM

## 2023-04-01 LAB — CUP PACEART INCLINIC DEVICE CHECK
Battery Remaining Longevity: 35 mo
Battery Voltage: 2.96 V
Brady Statistic AP VP Percent: 1.34 %
Brady Statistic AP VS Percent: 0.01 %
Brady Statistic AS VP Percent: 96.82 %
Brady Statistic AS VS Percent: 1.84 %
Brady Statistic RA Percent Paced: 1.34 %
Brady Statistic RV Percent Paced: 97.59 %
Date Time Interrogation Session: 20240702210956
Implantable Lead Connection Status: 753985
Implantable Lead Connection Status: 753985
Implantable Lead Implant Date: 20081014
Implantable Lead Implant Date: 20081014
Implantable Lead Location: 753859
Implantable Lead Location: 753860
Implantable Lead Model: 5076
Implantable Lead Model: 5076
Implantable Pulse Generator Implant Date: 20170911
Lead Channel Impedance Value: 437 Ohm
Lead Channel Impedance Value: 494 Ohm
Lead Channel Impedance Value: 494 Ohm
Lead Channel Impedance Value: 627 Ohm
Lead Channel Pacing Threshold Amplitude: 0.5 V
Lead Channel Pacing Threshold Amplitude: 0.875 V
Lead Channel Pacing Threshold Amplitude: 1 V
Lead Channel Pacing Threshold Pulse Width: 0.4 ms
Lead Channel Pacing Threshold Pulse Width: 0.4 ms
Lead Channel Pacing Threshold Pulse Width: 0.4 ms
Lead Channel Sensing Intrinsic Amplitude: 0.75 mV
Lead Channel Sensing Intrinsic Amplitude: 1 mV
Lead Channel Sensing Intrinsic Amplitude: 12.5 mV
Lead Channel Sensing Intrinsic Amplitude: 12.5 mV
Lead Channel Setting Pacing Amplitude: 2 V
Lead Channel Setting Pacing Amplitude: 2.5 V
Lead Channel Setting Pacing Pulse Width: 0.4 ms
Lead Channel Setting Sensing Sensitivity: 5.6 mV
Zone Setting Status: 755011
Zone Setting Status: 755011

## 2023-04-01 NOTE — Progress Notes (Signed)
Patient Care Team: Paulina Fusi, MD as PCP - General (Internal Medicine)   HPI  Amanda Bryant is a 82 y.o. female Seen in followup for syncope which turned out to be associated by intermittent complete heart block documented by loop recorder insertion 2008 and subsequently replaced by a dual-chamber pacemaker-[employer]tronic also in 2008 GEN change 9/17  .      DATE TEST EF   8/17 Echo   60-65 %             Knee arthroplasty 10/23    Date Cr K Hgb  10/23/ 0.49 4.0 10.6   5/24 0.78 3.7 12.5  The patient denies chest pain, shortness of breath, nocturnal dyspnea, orthopnea or peripheral edema.  There have been no palpitations, lightheadedness or syncope.    Past Medical History:  Diagnosis Date   Arthritis    Complete heart block (HCC)    intermittent, documented by prev ILR   GERD (gastroesophageal reflux disease)    HTN (hypertension)    Pacemaker    Implanted 2008   PONV (postoperative nausea and vomiting)    PVC's (premature ventricular contractions)    S/P cardiac catheterization    2001- nonobstructive disease; nl lv function   Shingles     Past Surgical History:  Procedure Laterality Date   BACK SURGERY  2010   CESAREAN SECTION  1967   CESAREAN SECTION  1972   CHOLECYSTECTOMY     COLONOSCOPY  07/22/2011   Colonic Polyp, mild sigmoid diverticulosis, small internal hemorrhoids. Bx: hyperplastic polyps.   EP IMPLANTABLE DEVICE N/A 06/10/2016   Procedure: PPM Generator Changeout;  Surgeon: Will Jorja Loa, MD;  [facility]: [facility];  Service: Cardiovascular;  Laterality: N/A;   ERCP  04/26/2015   Normal cholangiogram- no hilar/righthepatic duct stricture visualized.   TOTAL KNEE ARTHROPLASTY Right 07/15/2022   Procedure: TOTAL KNEE ARTHROPLASTY;  Surgeon: Joen Laura, MD;  Location: [facility];  Service: Orthopedics;  Laterality: Right;    Current Outpatient Medications  Medication Sig Dispense Refill   acetaminophen (TYLENOL) 500 MG  tablet Take 500 mg by mouth every 6 (six) hours as needed for mild pain.     albuterol (VENTOLIN HFA) 108 (90 Base) MCG/ACT inhaler INHALE 2 PUFFS BY MOUTH 4 TIMES DAILY     alendronate (FOSAMAX) 70 MG tablet Take 70 mg by mouth every 7 (seven) days. Take with a full glass of water on an empty stomach.     Calcium Carbonate (CALTRATE 600 PO) Take 1 tablet by mouth daily.      carvedilol (COREG) 6.25 MG tablet Take 6.25 mg by mouth 2 (two) times daily.     cephALEXin (KEFLEX) 500 MG capsule Take 1 capsule (500 mg total) by mouth 3 (three) times daily for 10 days. 30 capsule 0   Cholecalciferol (VITAMIN D) 1000 UNITS capsule Take 1,000 Units by mouth daily.       ciclopirox (PENLAC) 8 % solution Apply topically.     fish oil-omega-3 fatty acids 1000 MG capsule Take 2 g by mouth daily.       losartan (COZAAR) 50 MG tablet Take 50 mg by mouth daily.     omeprazole (PRILOSEC) 20 MG capsule Take 20 mg by mouth daily.       RESTASIS 0.05 % ophthalmic emulsion 1 drop 2 (two) times daily.     rOPINIRole (REQUIP) 4 MG tablet Take 4 mg by mouth at bedtime.  simvastatin (ZOCOR) 20 MG tablet Take 20 mg by mouth daily.      venlafaxine (EFFEXOR-XR) 37.5 MG 24 hr capsule Take 37.5 mg by mouth daily.        Allergies  Allergen Reactions   Codeine Nausea Only   Prednisone Palpitations and Other (See Comments)    Reaction: Increased heart rate   Ultram [Tramadol Hcl] Itching and Nausea And Vomiting   Tape     Pulls skin off    Prednisolone Acetate Palpitations    Review of Systems negative except from HPI and PMH  Physical Exam BP 138/74   Pulse (!) 53   Ht 5\' 5"  (1.651 m)   Wt 127 lb 9.6 oz (57.9 kg)   SpO2 94%   BMI 21.23 kg/m  Well developed and well nourished in no acute distress HENT normal Neck supple with JVP-flat Clear Device pocket well healed; without hematoma or erythema.  There is no tethering  Regular rate and rhythm, no  gallop No  murmur Abd-soft with active BS No  Clubbing cyanosis Edema Skin-warm and dry A & Oriented  Grossly normal sensory and motor function  ECG sinus P-synchronous/ AV  pacing and is dissecte  Device function is normal. Programming changes   See Paceart for details    Assessment and  Plan  Complete heart block  Stable   Pacemaker-Medtronic     Atrial Tachycardia   VT-NS  Hypertension    Device function is normal.  Even though she is pacing 100%, she has no symptoms to suggest heart failure or cardiomyopathy.  Will defer.  Blood pressure is well-controlled.  Will continue her on carvedilol and losartan.  Intermittent atrial tachycardia and nonsustained VT.  Continue her carvedilol.

## 2023-04-01 NOTE — Patient Instructions (Signed)
Medication Instructions:  Your physician recommends that you continue on your current medications as directed. Please refer to the Current Medication list given to you today.  *If you need a refill on your cardiac medications before your next appointment, please call your pharmacy*   Lab Work: None ordered.  If you have labs (blood work) drawn today and your tests are completely normal, you will receive your results only by: MyChart Message (if you have MyChart) OR A paper copy in the mail If you have any lab test that is abnormal or we need to change your treatment, we will call you to review the results.   Testing/Procedures: None ordered.    Follow-Up: At [facility], you and your health needs are our priority.  As part of our continuing mission to provide you with exceptional heart care, we have created designated Provider Care Teams.  These Care Teams include your primary Cardiologist (physician) and Advanced Practice Providers (APPs -  Physician Assistants and Nurse Practitioners) who all work together to provide you with the care you need, when you need it.  We recommend signing up for the patient portal called "MyChart".  Sign up information is provided on this After Visit Summary.  MyChart is used to connect with patients for Virtual Visits (Telemedicine).  Patients are able to view lab/test results, encounter notes, upcoming appointments, etc.  Non-urgent messages can be sent to your provider as well.   To learn more about what you can do with MyChart, go to [email or web address].    Your next appointment:   12 months with Dr Klein 

## 2023-04-04 ENCOUNTER — Encounter: Payer: Medicare Other | Admitting: Internal Medicine

## 2023-04-14 DIAGNOSIS — J019 Acute sinusitis, unspecified: Secondary | ICD-10-CM | POA: Diagnosis not present

## 2023-04-14 DIAGNOSIS — B9689 Other specified bacterial agents as the cause of diseases classified elsewhere: Secondary | ICD-10-CM | POA: Diagnosis not present

## 2023-04-14 IMAGING — US US GUIDANCE NEEDLE PLACEMENT
1 series · 13 of 13 positions shown · non-contrast
Comparison: none

INDICATION: 80-year-old female with acute right knee pain and a moderately large
Baker's cyst. She presents for ultrasound-guided aspiration and
steroid injection.

[Series 1: us guidance needle placement · 0.06mm/px · 13 acquisitions, 13 frames shown]
[im 1/13]
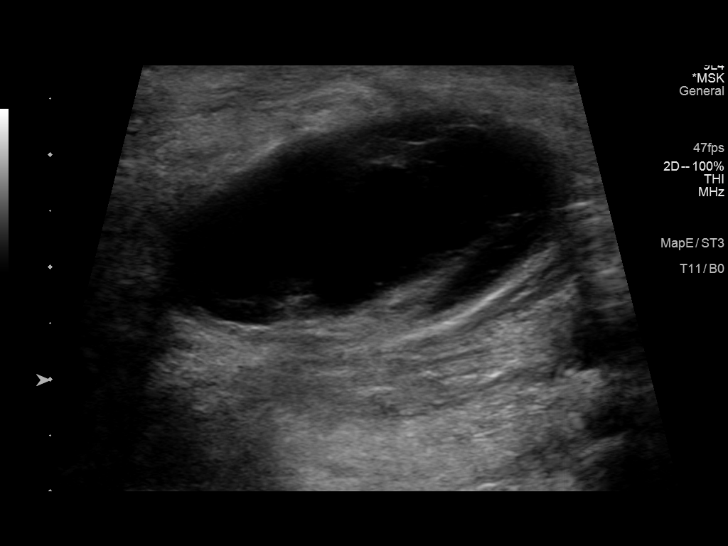
[im 2/13]
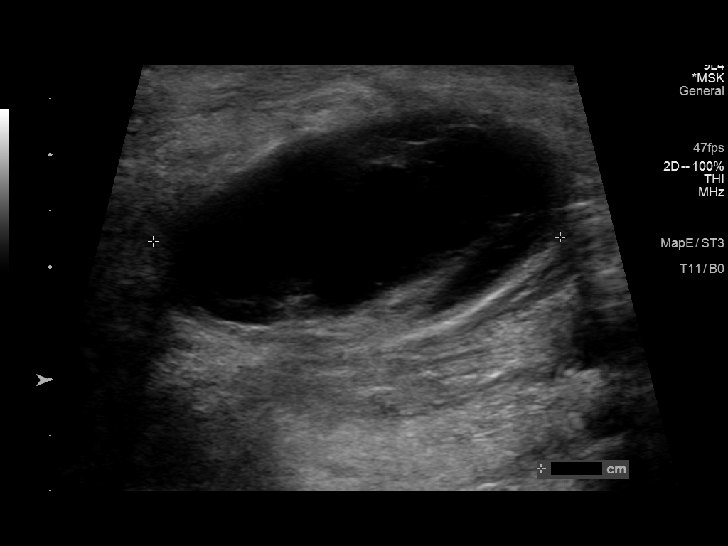
[im 3/13]
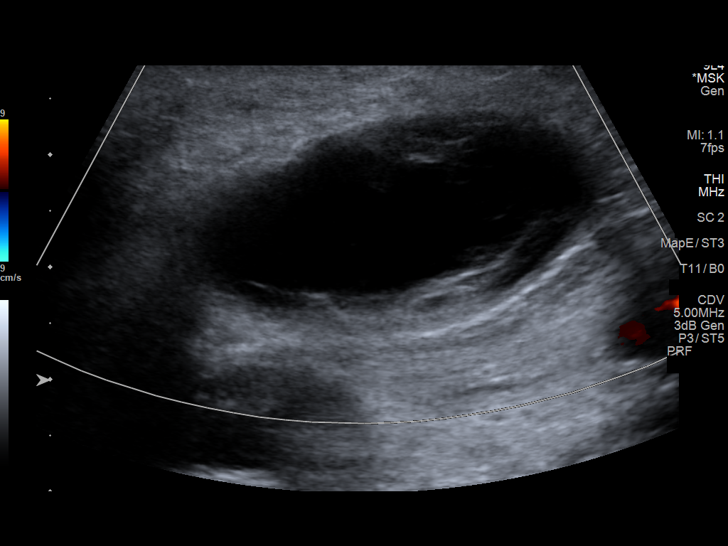
[im 4/13]
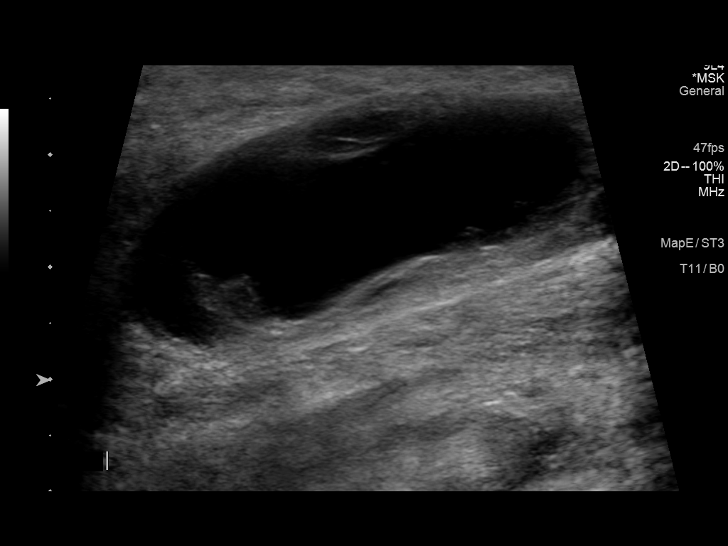
[im 5/13]
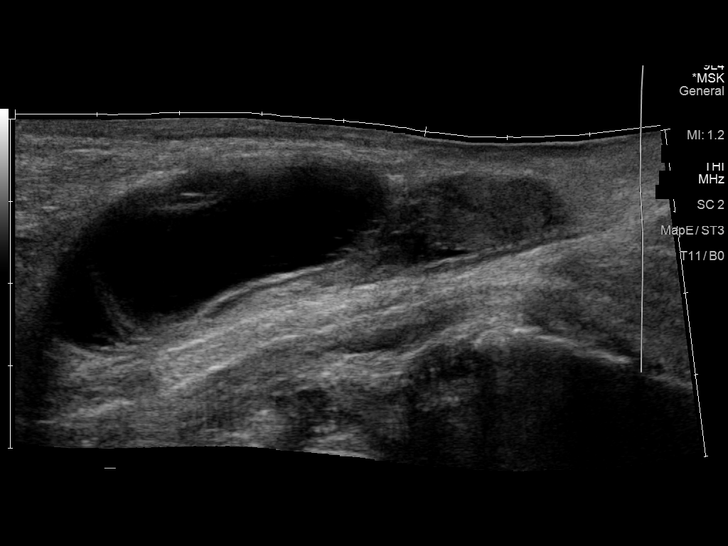
[im 6/13]
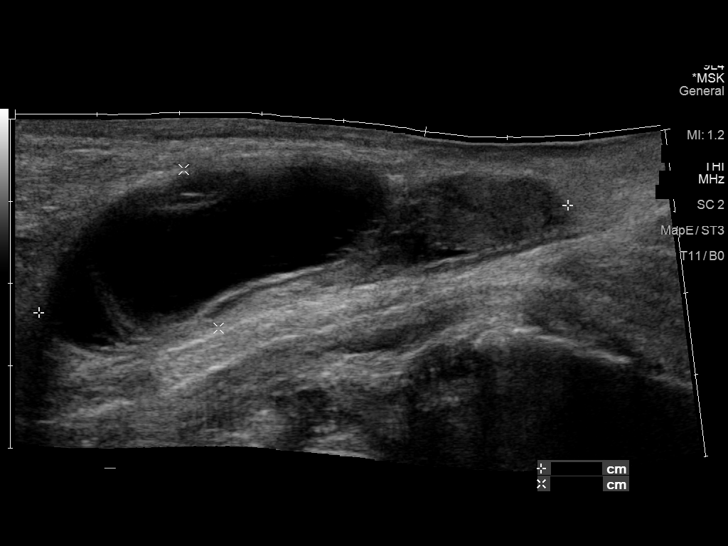
[im 7/13]
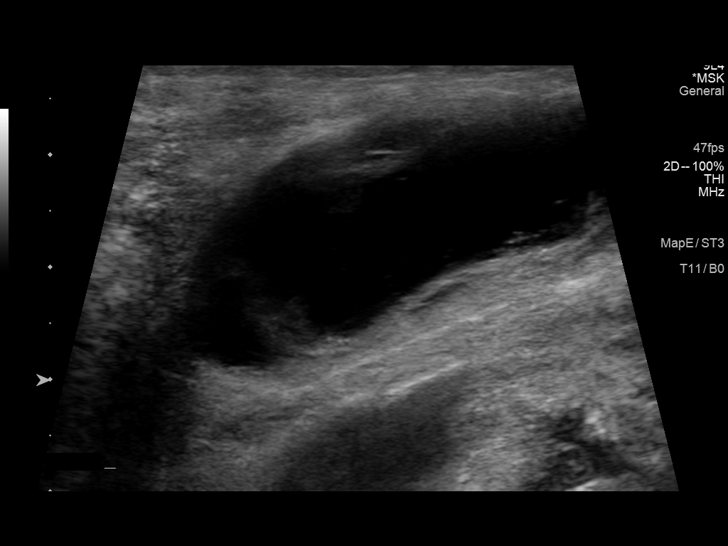
[im 8/13]
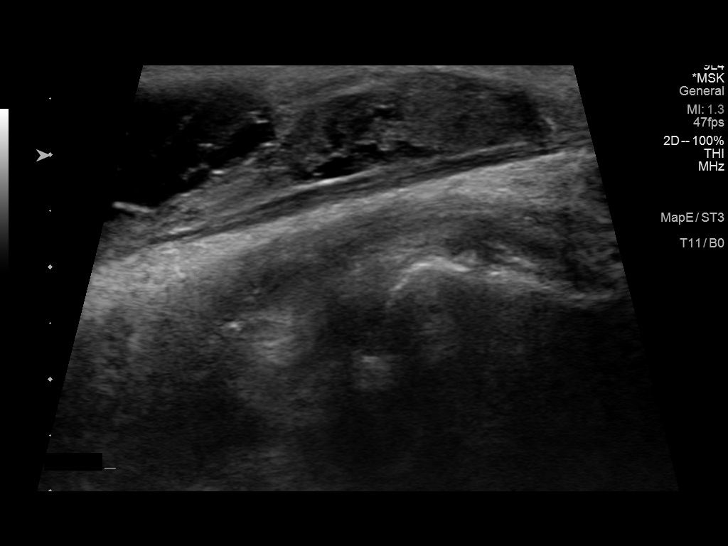
[im 9/13]
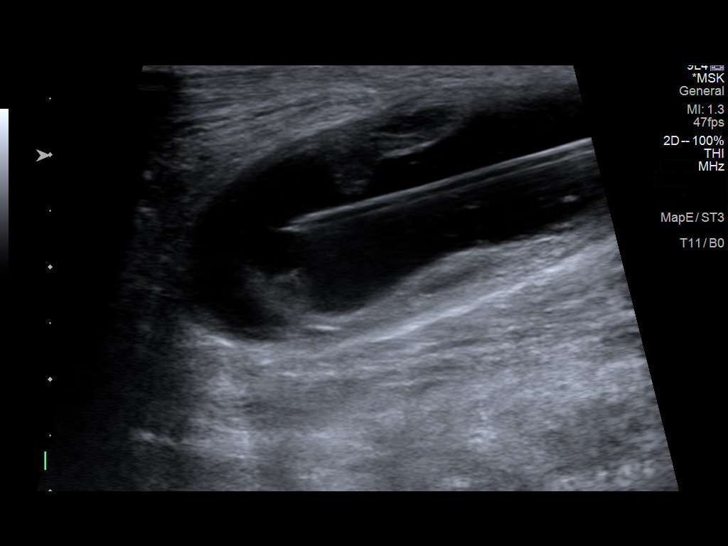
[im 10/13]
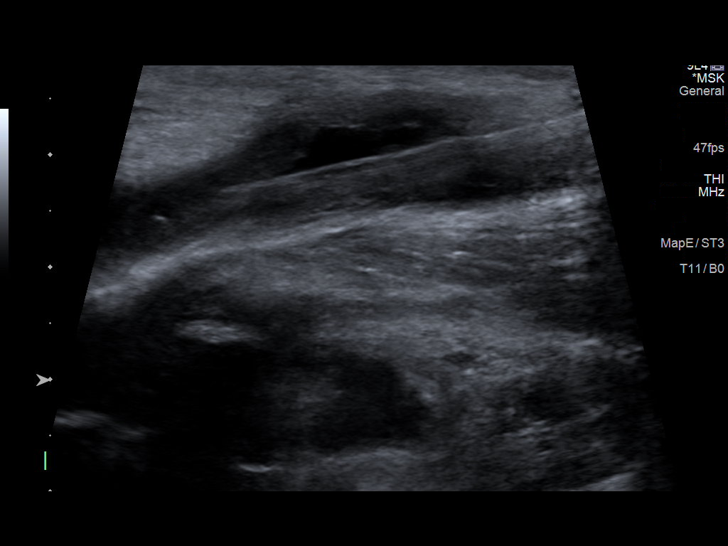
[im 11/13]
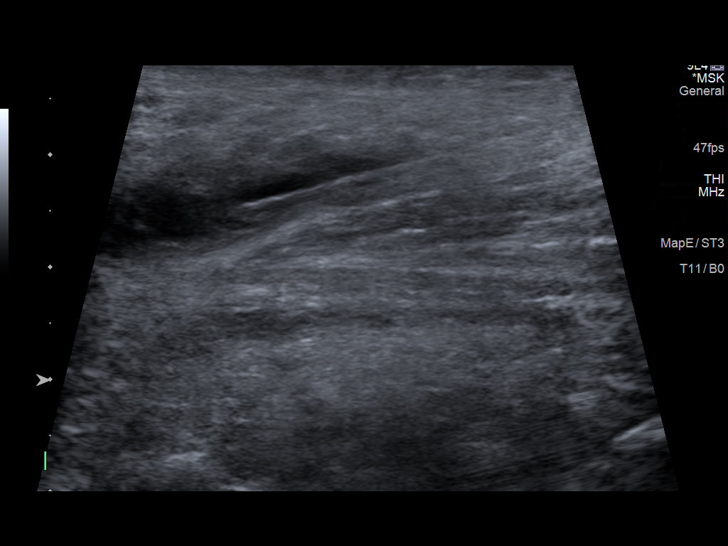
[im 12/13]
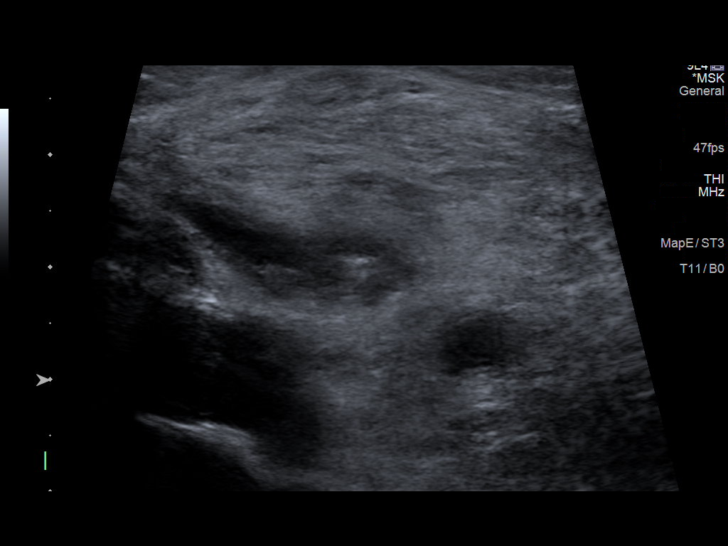
[im 13/13]
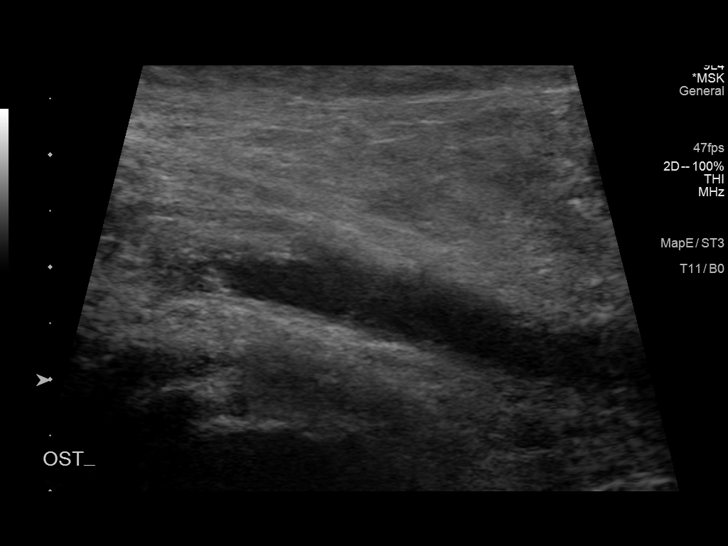

[13 of 13 positions shown; findings below may reference images not displayed]

EXAM:
Ultrasound-guided aspiration and steroid injection

MEDICATIONS:
None

ANESTHESIA/SEDATION:
None

COMPLICATIONS:
None immediate.

PROCEDURE:
Informed written consent was obtained from the patient after a
thorough discussion of the procedural risks, benefits and
alternatives. All questions were addressed. Maximal Sterile Barrier
Technique was utilized including caps, mask, sterile gowns, sterile
gloves, sterile drape, hand hygiene and skin antiseptic. A timeout
was performed prior to the initiation of the procedure.

Ultrasound was used to interrogate the popliteal fossa. And
approximately 6.6 x 2.0 by 3.6 cm complex fluid collection is
present in the popliteal fossa. A suitable skin entry site was
selected and marked. The overlying skin was sterilely prepped and
draped in the standard fashion using chlorhexidine skin prep. Local
anesthesia was attained by infiltration with 1% lidocaine. A small
dermatotomy was made. Under real-time ultrasound guidance, an 18
gauge trocar needle was advanced into the fluid collection.
Aspiration yields approximately 8 mL of thick and slightly
gelatinous golden synovial fluid. Following complete aspiration, 80
mg of Depo-Medrol and 1 mL 0.5% bupivacaine were injected into the
collapsed Baker's cyst. The needle was removed. Post ultrasound
imaging demonstrates no evidence of complication.
IMPRESSION: Successful aspiration of right popliteal fossa Baker's cyst followed
by injection of steroid and local anesthetic.

## 2023-04-21 DIAGNOSIS — B9689 Other specified bacterial agents as the cause of diseases classified elsewhere: Secondary | ICD-10-CM | POA: Diagnosis not present

## 2023-04-21 DIAGNOSIS — J019 Acute sinusitis, unspecified: Secondary | ICD-10-CM | POA: Diagnosis not present

## 2023-04-22 ENCOUNTER — Ambulatory Visit: Payer: Medicare Other | Admitting: Podiatry

## 2023-04-22 ENCOUNTER — Ambulatory Visit: Payer: Medicare Other | Admitting: Gastroenterology

## 2023-04-22 DIAGNOSIS — Q828 Other specified congenital malformations of skin: Secondary | ICD-10-CM

## 2023-04-22 DIAGNOSIS — I739 Peripheral vascular disease, unspecified: Secondary | ICD-10-CM

## 2023-04-22 NOTE — Progress Notes (Signed)
  Subjective:  Patient ID: Amanda Bryant, female    DOB: 1940-10-06,  MRN: [id number]  Chief Complaint  Patient presents with   Callouses    left foot callus on bottom and corn on little toe    82 y.o. female presents with the above complaint. History confirmed with patient. Patient presenting with pain related to callus present on the left foot x 2 areas. Patient does not have a history of T2DM. Pt does have history of PVD.  Patient is very painful hyperkeratotic lesion to the plantar aspect of the third and fourth metatarsal head as well as the fifth toe of the left foot.  Objective:  Physical Exam: warm, good capillary refill nail exam onychomycosis of the toenails DP pulses palpable, PT pulses palpable, and protective sensation absent Left Foot: Hyperkeratotic lesion is noted at the plantar aspect of the third fourth metatarsal only has 1 large callus on the left plantar forefoot as well as a second lesion on the dorsal lateral aspect of the fifth toe on the left foot no underlying ulcerations. Right Foot: No painful nails or lesions.   Assessment:   1. Porokeratosis   2. PVD (peripheral vascular disease) (HCC)      Plan:  Patient was evaluated and treated and all questions answered.  #Hyperkeratotic lesions/pre ulcerative calluses present left plantar forefoot and left fifth toe All symptomatic hyperkeratoses x 2 separate lesions were safely debrided with a sterile #10 blade to patient's level of comfort without incident. We discussed preventative and palliative care of these lesions including supportive and accommodative shoegear, padding, prefabricated and custom molded accommodative orthoses, use of a pumice stone and lotions/creams daily.  Return if symptoms worsen or fail to improve.         Corinna Gab, DPM [facility]

## 2023-05-07 DIAGNOSIS — G5601 Carpal tunnel syndrome, right upper limb: Secondary | ICD-10-CM | POA: Diagnosis not present

## 2023-05-12 ENCOUNTER — Telehealth: Payer: Self-pay

## 2023-05-12 NOTE — Telephone Encounter (Addendum)
Please see note below and advise  

## 2023-05-12 NOTE — Telephone Encounter (Signed)
Pharmacy Patient Advocate Encounter  Received notification from [facility] that Prior Authorization for Promethazine HCl 25MG  tablets  has been DENIED. Please advise how you'd like to proceed. Full denial letter will be uploaded to the media tab. See denial reason below.   Denied. We denied this request under Medicare Part D because Promethazine is not being prescribed for an FDA labeled or medically accepted use. A medically accepted use is approved by the FDA or supported by the [facility] Formulary Service Drug Information and the DRUGDEX Information System. In this case, Promethazine is not being prescribed in accordance with an FDA labeled use or use accepted by the Medicare approved drug compendia

## 2023-05-15 NOTE — Telephone Encounter (Signed)
Unfortunately, promethazine has been denied. Amanda Bryant should get in touch with Dr. Tomasa Blase Amanda Bryant should inquire price of promethazine from her pharmacy (that does not go through insurance)-if Amanda Bryant would want. RG

## 2023-05-16 DIAGNOSIS — J019 Acute sinusitis, unspecified: Secondary | ICD-10-CM | POA: Diagnosis not present

## 2023-05-16 DIAGNOSIS — U071 COVID-19: Secondary | ICD-10-CM | POA: Diagnosis not present

## 2023-05-16 DIAGNOSIS — B9689 Other specified bacterial agents as the cause of diseases classified elsewhere: Secondary | ICD-10-CM | POA: Diagnosis not present

## 2023-05-19 ENCOUNTER — Ambulatory Visit (INDEPENDENT_AMBULATORY_CARE_PROVIDER_SITE_OTHER): Payer: Medicare Other

## 2023-05-19 DIAGNOSIS — I442 Atrioventricular block, complete: Secondary | ICD-10-CM | POA: Diagnosis not present

## 2023-05-19 NOTE — Telephone Encounter (Signed)
Pt contacted in regard to promethazine. Pt stated that she picked up the medication and had to pay 15 dollars.  Pt verbalized understanding with all questions answered.

## 2023-05-23 LAB — CUP PACEART REMOTE DEVICE CHECK
Battery Remaining Longevity: 31 mo
Battery Voltage: 2.95 V
Brady Statistic AP VP Percent: 2.99 %
Brady Statistic AP VS Percent: 0 %
Brady Statistic AS VP Percent: 94.59 %
Brady Statistic AS VS Percent: 2.42 %
Brady Statistic RA Percent Paced: 2.96 %
Brady Statistic RV Percent Paced: 96.51 %
Date Time Interrogation Session: 20240823091756
Implantable Lead Connection Status: 753985
Implantable Lead Connection Status: 753985
Implantable Lead Implant Date: 20081014
Implantable Lead Implant Date: 20081014
Implantable Lead Location: 753859
Implantable Lead Location: 753860
Implantable Lead Model: 5076
Implantable Lead Model: 5076
Implantable Pulse Generator Implant Date: 20170911
Lead Channel Impedance Value: 418 Ohm
Lead Channel Impedance Value: 475 Ohm
Lead Channel Impedance Value: 513 Ohm
Lead Channel Impedance Value: 627 Ohm
Lead Channel Pacing Threshold Amplitude: 0.375 V
Lead Channel Pacing Threshold Amplitude: 0.875 V
Lead Channel Pacing Threshold Pulse Width: 0.4 ms
Lead Channel Pacing Threshold Pulse Width: 0.4 ms
Lead Channel Sensing Intrinsic Amplitude: 1 mV
Lead Channel Sensing Intrinsic Amplitude: 1 mV
Lead Channel Sensing Intrinsic Amplitude: 10.125 mV
Lead Channel Sensing Intrinsic Amplitude: 10.125 mV
Lead Channel Setting Pacing Amplitude: 2 V
Lead Channel Setting Pacing Amplitude: 2.5 V
Lead Channel Setting Pacing Pulse Width: 0.4 ms
Lead Channel Setting Sensing Sensitivity: 5.6 mV
Zone Setting Status: 755011
Zone Setting Status: 755011

## 2023-05-30 NOTE — Progress Notes (Signed)
Remote pacemaker transmission.   

## 2023-06-04 DIAGNOSIS — G5601 Carpal tunnel syndrome, right upper limb: Secondary | ICD-10-CM | POA: Diagnosis not present

## 2023-06-04 DIAGNOSIS — Z4789 Encounter for other orthopedic aftercare: Secondary | ICD-10-CM | POA: Diagnosis not present

## 2023-06-10 ENCOUNTER — Encounter: Payer: Self-pay | Admitting: Gastroenterology

## 2023-06-10 ENCOUNTER — Ambulatory Visit: Payer: Medicare Other | Admitting: Gastroenterology

## 2023-06-10 ENCOUNTER — Other Ambulatory Visit (INDEPENDENT_AMBULATORY_CARE_PROVIDER_SITE_OTHER): Payer: Medicare Other

## 2023-06-10 VITALS — BP 130/70 | HR 64 | Ht 64.0 in | Wt 128.1 lb

## 2023-06-10 DIAGNOSIS — K219 Gastro-esophageal reflux disease without esophagitis: Secondary | ICD-10-CM

## 2023-06-10 DIAGNOSIS — R634 Abnormal weight loss: Secondary | ICD-10-CM

## 2023-06-10 DIAGNOSIS — R1011 Right upper quadrant pain: Secondary | ICD-10-CM | POA: Diagnosis not present

## 2023-06-10 DIAGNOSIS — K449 Diaphragmatic hernia without obstruction or gangrene: Secondary | ICD-10-CM

## 2023-06-10 LAB — COMPREHENSIVE METABOLIC PANEL
ALT: 34 U/L (ref 0–35)
AST: 33 U/L (ref 0–37)
Albumin: 3.9 g/dL (ref 3.5–5.2)
Alkaline Phosphatase: 81 U/L (ref 39–117)
BUN: 25 mg/dL — ABNORMAL HIGH (ref 6–23)
CO2: 29 meq/L (ref 19–32)
Calcium: 9.2 mg/dL (ref 8.4–10.5)
Chloride: 103 meq/L (ref 96–112)
Creatinine, Ser: 0.78 mg/dL (ref 0.40–1.20)
GFR: 70.77 mL/min (ref >60.00)
Glucose, Bld: 99 mg/dL (ref 70–99)
Potassium: 4 meq/L (ref 3.5–5.1)
Sodium: 139 meq/L (ref 135–145)
Total Bilirubin: 0.4 mg/dL (ref 0.2–1.2)
Total Protein: 7 g/dL (ref 6.0–8.3)

## 2023-06-10 LAB — CBC WITH DIFFERENTIAL/PLATELET
Basophils Absolute: 0.1 10*3/uL (ref 0.0–0.1)
Basophils Relative: 1.1 % (ref 0.0–3.0)
Eosinophils Absolute: 0.6 10*3/uL (ref 0.0–0.7)
Eosinophils Relative: 5.8 % — ABNORMAL HIGH (ref 0.0–5.0)
HCT: 39.5 % (ref 36.0–46.0)
Hemoglobin: 12.7 g/dL (ref 12.0–15.0)
Lymphocytes Relative: 22.4 % (ref 12.0–46.0)
Lymphs Abs: 2.3 10*3/uL (ref 0.7–4.0)
MCHC: 32.2 g/dL (ref 30.0–36.0)
MCV: 93.2 fl (ref 78.0–100.0)
Monocytes Absolute: 0.9 10*3/uL (ref 0.1–1.0)
Monocytes Relative: 9.1 % (ref 3.0–12.0)
Neutro Abs: 6.2 10*3/uL (ref 1.4–7.7)
Neutrophils Relative %: 61.6 % (ref 43.0–77.0)
Platelets: 203 10*3/uL (ref 150.0–400.0)
RBC: 4.24 Mil/uL (ref 3.87–5.11)
RDW: 13.4 % (ref 11.5–15.5)
WBC: 10.1 10*3/uL (ref 4.0–10.5)

## 2023-06-10 LAB — LIPASE: Lipase: 40 U/L (ref 11.0–59.0)

## 2023-06-10 MED ORDER — PROMETHAZINE HCL 25 MG PO TABS: 25.0000 mg | ORAL_TABLET | Freq: Three times a day (TID) | ORAL | 2 refills | Status: AC | PRN

## 2023-06-10 NOTE — Patient Instructions (Addendum)
_______________________________________________________  If your blood pressure at your visit was 140/90 or greater, please contact your primary care physician to follow up on this.  _______________________________________________________  If you are age 82 or older, your body mass index should be between 23-30. Your Body mass index is 21.99 kg/m. If this is out of the aforementioned range listed, please consider follow up with your Primary Care Provider.  If you are age 59 or younger, your body mass index should be between 19-25. Your Body mass index is 21.99 kg/m. If this is out of the aformentioned range listed, please consider follow up with your Primary Care Provider.   ________________________________________________________  The [place] [facility] providers would like to encourage you to use [facility] to communicate with providers for non-urgent requests or questions.  Due to long hold times on the telephone, sending your provider a message by [facility] may be a faster and more efficient way to get a response.  Please allow 48 business hours for a response.  Please remember that this is for non-urgent requests.  _______________________________________________________  Your provider has requested that you go to the basement level for lab work before leaving today. Press "B" on the elevator. The lab is located at the first door on the left as you exit the elevator.  You have been scheduled for an endoscopy. Please follow written instructions given to you at your visit today.  We have sent the following medications to your pharmacy for you to pick up at your convenience: Phenegran  Continue omeprazole  Do small frequent meals  If you use inhalers (even only as needed), please bring them with you on the day of your procedure.  If you take any of the following medications, they will need to be adjusted prior to your procedure:   DO NOT TAKE 7 DAYS PRIOR TO TEST- Trulicity  (dulaglutide) Ozempic, Wegovy (semaglutide) Mounjaro (tirzepatide) Bydureon Bcise (exanatide extended release)  DO NOT TAKE 1 DAY PRIOR TO YOUR TEST Rybelsus (semaglutide) Adlyxin (lixisenatide) Victoza (liraglutide) Byetta (exanatide) ___________________________________________________________________________

## 2023-06-10 NOTE — Progress Notes (Addendum)
Chief Complaint: Abdominal pain  Referring Provider:  Paulina Fusi, MD      ASSESSMENT AND PLAN;   #1. RUQ abdominal pain (positive carnett sign) with episodic nausea. Pt is s/p chole in past and neg ERCP as below.  CT AP with contrast May 2024: neg  H/O GERD on chronic omeprazole. Neg EGD 07/2019 for etiology. Neg CT 06/2019(Cannot do MRCP due to pacemaker). Neg GES 08/2019. Neg [place] 2023 except for fatty liver.  #2. Wt loss (Oct 2023 R knee replacement). Neg recent CT AP 01/2023  #3. H/O Elevated alkaline phosphatase (resolved - normal alk phos at 63 05/2019). H/O cholecystectomy.  S/P ERCP 04/26/2015-Nl cholangiogram, sphincterotomy at [facility].  #4. GERD with small HH   Plan:  -CBC, CMP, lipase -EGD -Continue omeprazole 20 mg p.o. QD -Phenergan 25mg  po Q8hrs #30, 2RF fall precautions. -Small and frequent meals -FU thereafter   HPI:    Amanda Bryant is a 82 y.o. female  For follow-up visit  Still has "chronic right upper quadrant abdominal pain".  She describes occasional exacerbation which she describes as "attacks".    Most recent attack has been 3 weeks ago when she started having increasing abdominal pain associated with nausea and vomiting.  Phenergan does help.    She recently had negative CT Abdo/pelvis at [facility] May 2024.   Had negative extensive workup in past.   Phenergan helps her to sleep and relieves abdominal pain/nausea/vomiting.    No jaundice dark urine or pale stools.  No fever chills or night sweats.  Had normal solid-phase gastric emptying scan  Could not identify any exacerbating factors or any fatty food intolerance.  Has been having regular bowel movements ever since she eats fig/day.  Wt Readings from Last 3 Encounters:  06/10/23 128 lb 2 oz (58.1 kg)  04/01/23 127 lb 9.6 oz (57.9 kg)  03/19/23 127 lb 9.6 oz (57.9 kg)     Past GI procedures:  CT AP with contrast 01/2023 IMPRESSION: No acute findings or other  significant abnormality. No radiographic evidence of malignancy. Aortic Atherosclerosis (ICD10-I70.0).   -Colonoscopy 07/2011 6 mm colon polyp s/p polypectomy, mild sigmoid diverticulosis, int Hoids Bx-hyperplastic. -EGD 07/01/2019 2 cm hiatal hernia, moderate gastritis, duodenal diverticulum. Bx - neg for celiac/HP.  Negative small bowel biopsies. -CT AP with contrast 06/2019: No acute abnormalities. 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 Bx: hyperplastic polyps.   EP IMPLANTABLE DEVICE N/A 06/10/2016   Procedure: PPM Generator Changeout;  Surgeon: Will Jorja Loa, MD;  Location: [facility];  Service: Cardiovascular;  Laterality: N/A;   ERCP  04/26/2015   Normal cholangiogram- no hilar/righthepatic duct stricture  visualized.   JOINT REPLACEMENT Right    hand, x 2, 10/01/2022, 12/2022   TOTAL KNEE ARTHROPLASTY Right 07/15/2022   Procedure: TOTAL KNEE ARTHROPLASTY;  Surgeon: Joen Laura, MD;  Location: [facility];  Service: Orthopedics;  Laterality: Right;    Family History  Problem Relation Age of Onset   Heart disease Mother    Diabetes Mother    Diabetes Father    Heart disease Father    Diabetes Son    Colon cancer Neg Hx     Esophageal cancer Neg Hx    Rectal cancer Neg Hx    Stomach cancer Neg Hx     Social History   Tobacco Use   Smoking status: Never   Smokeless tobacco: Never  Vaping Use   Vaping status: Never Used  Substance Use Topics   Alcohol use: No    Alcohol/week: 0.0 standard drinks of alcohol   Drug use: No    Current Outpatient Medications  Medication Sig Dispense Refill   acetaminophen (TYLENOL) 650 MG CR tablet Take 650-1,300 mg by mouth every 8 (eight) hours as needed for pain.     albuterol (VENTOLIN HFA) 108 (90 Base) MCG/ACT inhaler Inhale 2 puffs into the lungs every 6 (six) hours as needed for wheezing or shortness of breath.     alendronate (FOSAMAX) 70 MG tablet Take 70 mg by mouth every Saturday. Take with a full glass of water on an empty stomach.     Azelastine HCl 137 MCG/SPRAY SOLN Place 2 sprays into both nostrils 2 (two) times daily.     Calcium Carbonate (CALTRATE 600 PO) Take 1 tablet by mouth daily.      carboxymethylcellulose (REFRESH PLUS) 0.5 % SOLN Place 1 drop into both eyes 3 (three) times daily as needed (dry eyes).     carvedilol (COREG) 6.25 MG tablet Take 6.25 mg by mouth 2 (two) times daily.     Cholecalciferol (VITAMIN D) 1000 UNITS capsule Take 1,000 Units by mouth daily.       estradiol (ESTRACE) 0.1 MG/GM vaginal cream Place 1 Applicatorful vaginally as needed.     fish oil-omega-3 fatty acids 1000 MG capsule Take 2 g by mouth daily.       furosemide (LASIX) 20 MG tablet Take 20-40 mg by mouth 2 (two) times daily.     Homeopathic Products (LEG CRAMPS) SUBL Place 1 tablet under the tongue as needed (leg cramps). Hyland's     losartan (COZAAR) 50 MG tablet Take 50 mg by mouth daily.     Misc Natural Products (GLUCOSAMINE CHOND MSM FORMULA PO) Take 2 tablets by mouth daily.     Multiple Vitamins-Minerals (MULTIVITAMIN WITH MINERALS) tablet Take 1 tablet by mouth daily.     omeprazole (PRILOSEC) 20 MG capsule Take 20 mg by mouth daily.       promethazine  (PHENERGAN) 25 MG tablet Take 1 tablet (25 mg total) by mouth every 8 (eight) hours as needed for nausea or vomiting. 30 tablet 2   promethazine-dextromethorphan (PROMETHAZINE-DM) 6.25-15 MG/5ML syrup Take 5 mLs by mouth 4 (four) times daily as needed.     Pseudoephedrine HCl (SUDAFED PO) Take 1 tablet by mouth as needed.     rOPINIRole (REQUIP) 4 MG tablet Take 4 mg by mouth at bedtime.     simvastatin (ZOCOR) 20 MG tablet Take 20 mg by mouth daily.      Turmeric 500 MG CAPS Take 500 mg by mouth daily.     venlafaxine XR (  EFFEXOR-XR) 75 MG 24 hr capsule Take 75 mg by mouth daily with breakfast.     zinc gluconate 50 MG tablet Take 50 mg by mouth daily.     Current Facility-Administered Medications  Medication Dose Route Frequency Provider Last Rate Last Admin   0.9 %  sodium chloride infusion  500 mL Intravenous Once Lynann Bologna, MD        Allergies  Allergen Reactions   Codeine Nausea Only   Prednisone Palpitations and Other (See Comments)    Reaction: Increased heart rate   Ultram [Tramadol Hcl] Itching and Nausea And Vomiting   Tape     Pulls skin off    Terbinafine     Other Reaction(s): loss of taste   Prednisolone Acetate Palpitations    Review of Systems:  neg     Physical Exam:    BP 130/70 (BP Location: Left Arm, Patient Position: Sitting, Cuff Size: Normal)   Pulse 64   Ht 5\' 4"  (1.626 m) Comment: height measured without shoes  Wt 128 lb 2 oz (58.1 kg)   BMI 21.99 kg/m  Filed Weights   06/10/23 0848  Weight: 128 lb 2 oz (58.1 kg)   Gen: awake, alert, NAD HEENT: anicteric, no pallor CV: RRR, no mrg Pulm: CTA b/l Abd: soft, NT/ND, +BS throughout Ext: no c/c/e Neuro: nonfocal   Data Reviewed: I have personally reviewed following labs and imaging studies  CBC:    Latest Ref Rng & Units 07/16/2022    3:17 AM 07/03/2022   10:30 AM 05/22/2022   11:40 AM  CBC  WBC 4.0 - 10.5 K/uL 15.8  8.6  7.4   Hemoglobin 12.0 - 15.0 g/dL 16.1  09.6  04.5    Hematocrit 36.0 - 46.0 % 34.0  40.4  36.4   Platelets 150 - 400 K/uL 180  184  157.0     CMP:    Latest Ref Rng & Units 07/16/2022    3:17 AM 07/03/2022   10:30 AM 05/22/2022   11:40 AM  CMP  Glucose 70 - 99 mg/dL 409  811  92   BUN 8 - 23 mg/dL 16  18  19    Creatinine 0.44 - 1.00 mg/dL 9.14  7.82  9.56   Sodium 135 - 145 mmol/L 138  140  139   Potassium 3.5 - 5.1 mmol/L 4.0  4.6  4.2   Chloride 98 - 111 mmol/L 105  106  107   CO2 22 - 32 mmol/L 26  27  29    Calcium 8.9 - 10.3 mg/dL 8.7  9.1  8.8   Total Protein 6.0 - 8.3 g/dL   6.3   Total Bilirubin 0.2 - 1.2 mg/dL   0.4   Alkaline Phos 39 - 117 U/L   71   AST 0 - 37 U/L   20   ALT 0 - 35 U/L   18       Latest Ref Rng & Units 05/22/2022   11:40 AM 03/05/2018   12:04 PM 12/01/2007   11:10 AM  Hepatic Function  Total Protein 6.0 - 8.3 g/dL 6.3  6.4  6.5   Albumin 3.5 - 5.2 g/dL 3.8  4.0  3.6   AST 0 - 37 U/L 20  35  30   ALT 0 - 35 U/L 18  30  33   Alk Phosphatase 39 - 117 U/L 71  75  59   Total Bilirubin 0.2 - 1.2 mg/dL 0.4  0.4  0.6          Edman Circle, MD 06/10/2023, 9:11 AM  Cc: Paulina Fusi, MD

## 2023-06-11 ENCOUNTER — Encounter: Payer: Self-pay | Admitting: Gastroenterology

## 2023-06-11 ENCOUNTER — Ambulatory Visit (AMBULATORY_SURGERY_CENTER): Payer: Medicare Other | Admitting: Gastroenterology

## 2023-06-11 VITALS — BP 138/60 | HR 68 | Temp 98.4°F | Resp 13 | Ht 64.0 in | Wt 128.0 lb

## 2023-06-11 DIAGNOSIS — R1011 Right upper quadrant pain: Secondary | ICD-10-CM

## 2023-06-11 DIAGNOSIS — K2940 Chronic atrophic gastritis without bleeding: Secondary | ICD-10-CM

## 2023-06-11 DIAGNOSIS — K2950 Unspecified chronic gastritis without bleeding: Secondary | ICD-10-CM | POA: Diagnosis not present

## 2023-06-11 MED ORDER — SODIUM CHLORIDE 0.9 % IV SOLN: 500.0000 mL | Freq: Once | INTRAVENOUS | Status: DC

## 2023-06-11 NOTE — Patient Instructions (Signed)
Please read handouts provided. Continue present medications, including as needed Phenergan. Await pathology results.   YOU HAD AN ENDOSCOPIC PROCEDURE TODAY AT THE [facility]:   Refer to the procedure report that was given to you for any specific questions about what was found during the examination.  If the procedure report does not answer your questions, please call your gastroenterologist to clarify.  If you requested that your care partner not be given the details of your procedure findings, then the procedure report has been included in a sealed envelope for you to review at your convenience later.  YOU SHOULD EXPECT: Some feelings of bloating in the abdomen. Passage of more gas than usual.  Walking can help get rid of the air that was put into your GI tract during the procedure and reduce the bloating. If you had a lower endoscopy (such as a colonoscopy or flexible sigmoidoscopy) you may notice spotting of blood in your stool or on the toilet paper. If you underwent a bowel prep for your procedure, you may not have a normal bowel movement for a few days.  Please Note:  You might notice some irritation and congestion in your nose or some drainage.  This is from the oxygen used during your procedure.  There is no need for concern and it should clear up in a day or so.  SYMPTOMS TO REPORT IMMEDIATELY:  Following upper endoscopy (EGD)  Vomiting of blood or coffee ground material  New chest pain or pain under the shoulder blades  Painful or persistently difficult swallowing  New shortness of breath  Fever of 100F or higher  Black, tarry-looking stools  For urgent or emergent issues, a gastroenterologist can be reached at any hour by calling [phone]. Do not use MyChart messaging for urgent concerns.    DIET:  We do recommend a small meal at first, but then you may proceed to your regular diet.  Drink plenty of fluids but you should avoid alcoholic beverages for 24  hours.  ACTIVITY:  You should plan to take it easy for the rest of today and you should NOT DRIVE or use heavy machinery until tomorrow (because of the sedation medicines used during the test).    FOLLOW UP: Our staff will call the number listed on your records the next business day following your procedure.  We will call around 7:15- 8:00 am to check on you and address any questions or concerns that you may have regarding the information given to you following your procedure. If we do not reach you, we will leave a message.     If any biopsies were taken you will be contacted by phone or by letter within the next 1-3 weeks.  Please call us at [phone] if you have not heard about the biopsies in 3 weeks.    SIGNATURES/CONFIDENTIALITY: You and/or your care partner have signed paperwork which will be entered into your electronic medical record.  These signatures attest to the fact that that the information above on your After Visit Summary has been reviewed and is understood.  Full responsibility of the confidentiality of this discharge information lies with you and/or your care-partner.

## 2023-06-11 NOTE — Progress Notes (Signed)
Report to PACU, RN, vss, BBS= Clear.  

## 2023-06-11 NOTE — Progress Notes (Signed)
Vitals-CW  Pt's states no medical or surgical changes since previsit or office visit. 

## 2023-06-11 NOTE — Op Note (Signed)
[facility] Patient Name: Amanda Bryant Procedure Date: 06/11/2023 1:53 PM MRN: [id number] Endoscopist: Lynann Bologna , MD, [id number] Age: 82 Referring MD:  Date of Birth: [birth year] Gender: Female Account #: [id number] Procedure:                Upper GI endoscopy Indications:              Abdominal pain in the right upper quadrant with occ                            N/V. Neg CT Abdo/pelvis. Normal CBC, CMP and lipase Medicines:                Monitored Anesthesia Care Procedure:                Pre-Anesthesia Assessment:                           - Prior to the procedure, a History and Physical                            was performed, and patient medications and                            allergies were reviewed. The patient's tolerance of                            previous anesthesia was also reviewed. The risks                            and benefits of the procedure and the sedation                            options and risks were discussed with the patient.                            All questions were answered, and informed consent                            was obtained. Prior Anticoagulants: The patient has                            taken no anticoagulant or antiplatelet agents. ASA                            Grade Assessment: II - A patient with mild systemic                            disease. After reviewing the risks and benefits,                            the patient was deemed in satisfactory condition to                            undergo the procedure.  After obtaining informed consent, the endoscope was                            passed under direct vision. Throughout the                            procedure, the patient's blood pressure, pulse, and                            oxygen saturations were monitored continuously. The                            GIF [id number] #[id number] was introduced through the                            mouth,  and advanced to the second part of duodenum.                            The upper GI endoscopy was accomplished without                            difficulty. The patient tolerated the procedure                            well. Scope In: Scope Out: Findings:                 The examined esophagus was normal with well-defined                            Z-line at 36 cm.                           A 2 cm hiatal hernia was present.                           Diffuse mild inflammation characterized by erythema                            was found in the entire examined stomach. Biopsies                            were taken with a cold forceps for histology.                           A 10 mm non-bleeding diverticulum was found in the                            second portion of the duodenum.                           The exam was otherwise without abnormality. Complications:            No immediate complications. Estimated Blood Loss:     Estimated blood loss: none. Impression:               -  2 cm hiatal hernia.                           - Atrophic gastritis. Biopsied.                           - Non-bleeding duodenal diverticulum.                           - The examination was otherwise normal. Recommendation:           - Patient has a contact number available for                            emergencies. The signs and symptoms of potential                            delayed complications were discussed with the                            patient. Return to normal activities tomorrow.                            Written discharge instructions were provided to the                            patient.                           - Resume previous diet.                           - Continue present medications including as needed                            Phenergan.                           - Await pathology results.                           - The findings and recommendations were discussed                             with the patient's family. Lynann Bologna, MD 06/11/2023 2:12:42 PM This report has been signed electronically.

## 2023-06-11 NOTE — Progress Notes (Signed)
Chief Complaint: EGD  Referring Provider:  Paulina Fusi, MD      ASSESSMENT AND PLAN;   #1. RUQ abdominal pain (positive carnett sign) with episodic nausea. Pt is s/p chole in past and neg ERCP as below.  CT AP with contrast May 2024: neg  H/O GERD on chronic omeprazole. Neg EGD 07/2019 for etiology. Neg CT 06/2019(Cannot do MRCP due to pacemaker). Neg GES 08/2019. Neg [place] 2023 except for fatty liver.  #2. Wt loss (Oct 2023 R knee replacement). Neg recent CT AP 01/2023  #3. H/O Elevated alkaline phosphatase (resolved - normal alk phos at 63 05/2019). H/O cholecystectomy.  S/P ERCP 04/26/2015-Nl cholangiogram, sphincterotomy at [facility].  #4. GERD with small HH   Plan:  -CBC, CMP, lipase - Done yesterday-  normal -EGD today. -Continue omeprazole 20 mg p.o. QD -Phenergan 25mg  po Q8hrs #30, 2RF fall precautions.    HPI:    Amanda Bryant is a 82 y.o. female  For follow-up visit  Still has "chronic right upper quadrant abdominal pain".  She describes occasional exacerbation which she describes as "attacks".    Most recent attack has been 3 weeks ago when she started having increasing abdominal pain associated with nausea and vomiting.  Phenergan does help.    She recently had negative CT Abdo/pelvis at [facility] May 2024.   Had negative extensive workup in past.   Phenergan helps her to sleep and relieves abdominal pain/nausea/vomiting.    No jaundice dark urine or pale stools.  No fever chills or night sweats.  Had normal solid-phase gastric emptying scan  Could not identify any exacerbating factors or any fatty food intolerance.  Has been having regular bowel movements ever since she eats fig/day.  Wt Readings from Last 3 Encounters:  06/11/23 128 lb (58.1 kg)  06/10/23 128 lb 2 oz (58.1 kg)  04/01/23 127 lb 9.6 oz (57.9 kg)     Past GI procedures:  CT AP with contrast 01/2023 IMPRESSION: No acute findings or other significant abnormality. No  radiographic evidence of malignancy. Aortic Atherosclerosis (ICD10-I70.0).   -Colonoscopy 07/2011 6 mm colon polyp s/p polypectomy, mild sigmoid diverticulosis, int Hoids Bx-hyperplastic. -EGD 07/01/2019 2 cm hiatal hernia, moderate gastritis, duodenal diverticulum. Bx - neg for celiac/HP.  Negative small bowel biopsies. -CT AP with contrast 06/2019: No acute abnormalities. 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 Bx: hyperplastic polyps.   EP IMPLANTABLE DEVICE N/A 06/10/2016   Procedure: PPM Generator Changeout;  Surgeon: Will Jorja Loa, MD;  Location: [facility];  Service: Cardiovascular;  Laterality: N/A;   ERCP  04/26/2015   Normal cholangiogram- no hilar/righthepatic duct stricture visualized.  JOINT REPLACEMENT Right    hand, x 2, 10/01/2022, 12/2022   TOTAL KNEE ARTHROPLASTY Right 07/15/2022   Procedure: TOTAL KNEE ARTHROPLASTY;  Surgeon: Joen Laura, MD;  Location: [facility];  Service: Orthopedics;  Laterality: Right;    Family History  Problem Relation Age of Onset   Heart disease Mother    Diabetes Mother    Diabetes Father    Heart disease Father    Diabetes Son    Colon cancer Neg Hx    Esophageal cancer Neg Hx     Rectal cancer Neg Hx    Stomach cancer Neg Hx     Social History   Tobacco Use   Smoking status: Never   Smokeless tobacco: Never  Vaping Use   Vaping status: Never Used  Substance Use Topics   Alcohol use: No    Alcohol/week: 0.0 standard drinks of alcohol   Drug use: No    Current Outpatient Medications  Medication Sig Dispense Refill   acetaminophen (TYLENOL) 650 MG CR tablet Take 650-1,300 mg by mouth every 8 (eight) hours as needed for pain.     albuterol (VENTOLIN HFA) 108 (90 Base) MCG/ACT inhaler Inhale 2 puffs into the lungs every 6 (six) hours as needed for wheezing or shortness of breath.     alendronate (FOSAMAX) 70 MG tablet Take 70 mg by mouth every Saturday. Take with a full glass of water on an empty stomach.     Azelastine HCl 137 MCG/SPRAY SOLN Place 2 sprays into both nostrils 2 (two) times daily.     Calcium Carbonate (CALTRATE 600 PO) Take 1 tablet by mouth daily.      carboxymethylcellulose (REFRESH PLUS) 0.5 % SOLN Place 1 drop into both eyes 3 (three) times daily as needed (dry eyes).     carvedilol (COREG) 6.25 MG tablet Take 6.25 mg by mouth 2 (two) times daily.     Cholecalciferol (VITAMIN D) 1000 UNITS capsule Take 1,000 Units by mouth daily.       estradiol (ESTRACE) 0.1 MG/GM vaginal cream Place 1 Applicatorful vaginally as needed.     fish oil-omega-3 fatty acids 1000 MG capsule Take 2 g by mouth daily.       furosemide (LASIX) 20 MG tablet Take 20-40 mg by mouth 2 (two) times daily.     Homeopathic Products (LEG CRAMPS) SUBL Place 1 tablet under the tongue as needed (leg cramps). Hyland's     losartan (COZAAR) 50 MG tablet Take 50 mg by mouth daily.     Misc Natural Products (GLUCOSAMINE CHOND MSM FORMULA PO) Take 2 tablets by mouth daily.     Multiple Vitamins-Minerals (MULTIVITAMIN WITH MINERALS) tablet Take 1 tablet by mouth daily.     omeprazole (PRILOSEC) 20 MG capsule Take 20 mg by mouth daily.       promethazine (PHENERGAN) 25 MG tablet  Take 1 tablet (25 mg total) by mouth every 8 (eight) hours as needed for nausea or vomiting. 30 tablet 2   promethazine (PHENERGAN) 25 MG tablet Take 1 tablet (25 mg total) by mouth every 8 (eight) hours as needed for nausea or vomiting. 30 tablet 2   promethazine-dextromethorphan (PROMETHAZINE-DM) 6.25-15 MG/5ML syrup Take 5 mLs by mouth 4 (four) times daily as needed.     Pseudoephedrine HCl (SUDAFED PO) Take 1 tablet by mouth as needed.     rOPINIRole (REQUIP) 4 MG tablet Take 4 mg by mouth at bedtime.     simvastatin (ZOCOR) 20 MG tablet Take 20  mg by mouth daily.      Turmeric 500 MG CAPS Take 500 mg by mouth daily.     venlafaxine XR (EFFEXOR-XR) 75 MG 24 hr capsule Take 75 mg by mouth daily with breakfast.     zinc gluconate 50 MG tablet Take 50 mg by mouth daily.     Current Facility-Administered Medications  Medication Dose Route Frequency Provider Last Rate Last Admin   0.9 %  sodium chloride infusion  500 mL Intravenous Once Lynann Bologna, MD        Allergies  Allergen Reactions   Codeine Nausea Only   Prednisone Palpitations and Other (See Comments)    Reaction: Increased heart rate   Ultram [Tramadol Hcl] Itching and Nausea And Vomiting   Tape     Pulls skin off    Terbinafine     Other Reaction(s): loss of taste   Prednisolone Acetate Palpitations    Review of Systems:  neg     Physical Exam:    BP 132/68 (BP Location: Right Arm, Patient Position: Sitting, Cuff Size: Normal)   Pulse 62   Temp 98.4 F (36.9 C) (Temporal)   Ht 5\' 4"  (1.626 m)   Wt 128 lb (58.1 kg)   SpO2 96%   BMI 21.97 kg/m  Filed Weights   06/11/23 1332  Weight: 128 lb (58.1 kg)   Gen: awake, alert, NAD HEENT: anicteric, no pallor CV: RRR, no mrg Pulm: CTA b/l Abd: soft, NT/ND, +BS throughout Ext: no c/c/e Neuro: nonfocal   Data Reviewed: I have personally reviewed following labs and imaging studies  CBC:    Latest Ref Rng & Units 06/10/2023    9:40 AM 07/16/2022    3:17 AM  07/03/2022   10:30 AM  CBC  WBC 4.0 - 10.5 K/uL 10.1  15.8  8.6   Hemoglobin 12.0 - 15.0 g/dL 40.9  81.1  91.4   Hematocrit 36.0 - 46.0 % 39.5  34.0  40.4   Platelets 150.0 - 400.0 K/uL 203.0  180  184     CMP:    Latest Ref Rng & Units 06/10/2023    9:40 AM 07/16/2022    3:17 AM 07/03/2022   10:30 AM  CMP  Glucose 70 - 99 mg/dL 99  782  956   BUN 6 - 23 mg/dL 25  16  18    Creatinine 0.40 - 1.20 mg/dL 2.13  0.86  5.78   Sodium 135 - 145 mEq/L 139  138  140   Potassium 3.5 - 5.1 mEq/L 4.0  4.0  4.6   Chloride 96 - 112 mEq/L 103  105  106   CO2 19 - 32 mEq/L 29  26  27    Calcium 8.4 - 10.5 mg/dL 9.2  8.7  9.1   Total Protein 6.0 - 8.3 g/dL 7.0     Total Bilirubin 0.2 - 1.2 mg/dL 0.4     Alkaline Phos 39 - 117 U/L 81     AST 0 - 37 U/L 33     ALT 0 - 35 U/L 34         Latest Ref Rng & Units 06/10/2023    9:40 AM 05/22/2022   11:40 AM 03/05/2018   12:04 PM  Hepatic Function  Total Protein 6.0 - 8.3 g/dL 7.0  6.3  6.4   Albumin 3.5 - 5.2 g/dL 3.9  3.8  4.0   AST 0 - 37 U/L 33  20  35   ALT 0 -  35 U/L 34  18  30   Alk Phosphatase 39 - 117 U/L 81  71  75   Total Bilirubin 0.2 - 1.2 mg/dL 0.4  0.4  0.4          Edman Circle, MD 06/11/2023, 1:37 PM  Cc: Paulina Fusi, MD

## 2023-06-11 NOTE — Progress Notes (Signed)
Called to room to assist during endoscopic procedure.  Patient ID and intended procedure confirmed with present staff. Received instructions for my participation in the procedure from the performing physician.  

## 2023-06-12 ENCOUNTER — Telehealth: Payer: Self-pay

## 2023-06-12 NOTE — Telephone Encounter (Signed)
Patient states there have been no changes to medical or surgical history since time of pre-visit. 

## 2023-06-17 LAB — SURGICAL PATHOLOGY

## 2023-06-23 DIAGNOSIS — Z79899 Other long term (current) drug therapy: Secondary | ICD-10-CM | POA: Diagnosis not present

## 2023-06-23 DIAGNOSIS — Z1331 Encounter for screening for depression: Secondary | ICD-10-CM | POA: Diagnosis not present

## 2023-06-23 DIAGNOSIS — Z950 Presence of cardiac pacemaker: Secondary | ICD-10-CM | POA: Diagnosis not present

## 2023-06-23 DIAGNOSIS — I10 Essential (primary) hypertension: Secondary | ICD-10-CM | POA: Diagnosis not present

## 2023-06-23 DIAGNOSIS — F411 Generalized anxiety disorder: Secondary | ICD-10-CM | POA: Diagnosis not present

## 2023-06-23 DIAGNOSIS — M810 Age-related osteoporosis without current pathological fracture: Secondary | ICD-10-CM | POA: Diagnosis not present

## 2023-06-23 DIAGNOSIS — E785 Hyperlipidemia, unspecified: Secondary | ICD-10-CM | POA: Diagnosis not present

## 2023-07-06 ENCOUNTER — Encounter: Payer: Self-pay | Admitting: Gastroenterology

## 2023-07-11 DIAGNOSIS — G5601 Carpal tunnel syndrome, right upper limb: Secondary | ICD-10-CM | POA: Diagnosis not present

## 2023-07-17 DIAGNOSIS — Z1231 Encounter for screening mammogram for malignant neoplasm of breast: Secondary | ICD-10-CM | POA: Diagnosis not present

## 2023-07-17 DIAGNOSIS — M25561 Pain in right knee: Secondary | ICD-10-CM | POA: Diagnosis not present

## 2023-07-18 DIAGNOSIS — Z23 Encounter for immunization: Secondary | ICD-10-CM | POA: Diagnosis not present

## 2023-07-18 DIAGNOSIS — Z1231 Encounter for screening mammogram for malignant neoplasm of breast: Secondary | ICD-10-CM | POA: Diagnosis not present

## 2023-07-18 DIAGNOSIS — Z0000 Encounter for general adult medical examination without abnormal findings: Secondary | ICD-10-CM | POA: Diagnosis not present

## 2023-07-18 DIAGNOSIS — Z1211 Encounter for screening for malignant neoplasm of colon: Secondary | ICD-10-CM | POA: Diagnosis not present

## 2023-07-31 ENCOUNTER — Other Ambulatory Visit: Payer: Self-pay | Admitting: Orthopedic Surgery

## 2023-07-31 DIAGNOSIS — T84033A Mechanical loosening of internal left knee prosthetic joint, initial encounter: Secondary | ICD-10-CM

## 2023-07-31 DIAGNOSIS — M25561 Pain in right knee: Secondary | ICD-10-CM | POA: Diagnosis not present

## 2023-08-01 DIAGNOSIS — G5601 Carpal tunnel syndrome, right upper limb: Secondary | ICD-10-CM | POA: Diagnosis not present

## 2023-08-13 ENCOUNTER — Ambulatory Visit
Admission: RE | Admit: 2023-08-13 | Discharge: 2023-08-13 | Disposition: A | Discharge status: Home / Self Care | Payer: Medicare Other | Source: Ambulatory Visit | Attending: Orthopedic Surgery | Admitting: Orthopedic Surgery

## 2023-08-13 ENCOUNTER — Other Ambulatory Visit: Payer: Self-pay | Admitting: Orthopedic Surgery

## 2023-08-13 DIAGNOSIS — M25561 Pain in right knee: Secondary | ICD-10-CM | POA: Diagnosis not present

## 2023-08-13 DIAGNOSIS — T84033A Mechanical loosening of internal left knee prosthetic joint, initial encounter: Secondary | ICD-10-CM

## 2023-08-13 DIAGNOSIS — Z96651 Presence of right artificial knee joint: Secondary | ICD-10-CM | POA: Diagnosis not present

## 2023-08-13 DIAGNOSIS — M25461 Effusion, right knee: Secondary | ICD-10-CM | POA: Diagnosis not present

## 2023-08-14 DIAGNOSIS — M25531 Pain in right wrist: Secondary | ICD-10-CM | POA: Diagnosis not present

## 2023-08-18 ENCOUNTER — Ambulatory Visit (INDEPENDENT_AMBULATORY_CARE_PROVIDER_SITE_OTHER): Payer: Medicare Other

## 2023-08-18 DIAGNOSIS — I442 Atrioventricular block, complete: Secondary | ICD-10-CM | POA: Diagnosis not present

## 2023-08-29 ENCOUNTER — Telehealth: Payer: Self-pay | Admitting: Pharmacy Technician

## 2023-08-29 ENCOUNTER — Other Ambulatory Visit (HOSPITAL_COMMUNITY): Payer: Self-pay

## 2023-08-29 LAB — CUP PACEART REMOTE DEVICE CHECK
Battery Remaining Longevity: 29 mo
Battery Voltage: 2.95 V
Brady Statistic AP VP Percent: 2.12 %
Brady Statistic AP VS Percent: 0 %
Brady Statistic AS VP Percent: 96.23 %
Brady Statistic AS VS Percent: 1.65 %
Brady Statistic RA Percent Paced: 2.11 %
Brady Statistic RV Percent Paced: 97.59 %
Date Time Interrogation Session: 20241127182452
Implantable Lead Connection Status: 753985
Implantable Lead Connection Status: 753985
Implantable Lead Implant Date: 20081014
Implantable Lead Implant Date: 20081014
Implantable Lead Location: 753859
Implantable Lead Location: 753860
Implantable Lead Model: 5076
Implantable Lead Model: 5076
Implantable Pulse Generator Implant Date: 20170911
Lead Channel Impedance Value: 399 Ohm
Lead Channel Impedance Value: 456 Ohm
Lead Channel Impedance Value: 456 Ohm
Lead Channel Impedance Value: 608 Ohm
Lead Channel Pacing Threshold Amplitude: 0.375 V
Lead Channel Pacing Threshold Amplitude: 0.75 V
Lead Channel Pacing Threshold Pulse Width: 0.4 ms
Lead Channel Pacing Threshold Pulse Width: 0.4 ms
Lead Channel Sensing Intrinsic Amplitude: 1.25 mV
Lead Channel Sensing Intrinsic Amplitude: 1.25 mV
Lead Channel Sensing Intrinsic Amplitude: 10.375 mV
Lead Channel Sensing Intrinsic Amplitude: 10.375 mV
Lead Channel Setting Pacing Amplitude: 2 V
Lead Channel Setting Pacing Amplitude: 2.5 V
Lead Channel Setting Pacing Pulse Width: 0.4 ms
Lead Channel Setting Sensing Sensitivity: 5.6 mV
Zone Setting Status: 755011
Zone Setting Status: 755011

## 2023-08-29 NOTE — Telephone Encounter (Signed)
Pharmacy Patient Advocate Encounter  Received notification from [facility] that Prior Authorization for PROMETHAZINE 25MG  has been DENIED.  Full denial letter will be uploaded to the media tab. See denial reason below.   PA #/Case ID/Reference #: [id number]

## 2023-08-29 NOTE — Telephone Encounter (Signed)
Pharmacy Patient Advocate Encounter   Received notification from CoverMyMeds that prior authorization for PROMETHAZINE 25MG  is required/requested.   Insurance verification completed.   The patient is insured through [facility] .   Per test claim: PA required; PA submitted to above mentioned insurance via CoverMyMeds Key/confirmation #/EOC [id number] Status is pending

## 2023-09-01 NOTE — Telephone Encounter (Signed)
Noted  

## 2023-09-12 NOTE — Addendum Note (Signed)
Addended by: Geralyn Flash D on: 09/12/2023 11:13 AM   Modules accepted: Orders

## 2023-09-12 NOTE — Progress Notes (Signed)
Remote pacemaker transmission.   

## 2023-09-15 ENCOUNTER — Ambulatory Visit: Payer: Medicare Other | Admitting: Gastroenterology

## 2023-09-15 ENCOUNTER — Encounter: Payer: Self-pay | Admitting: Gastroenterology

## 2023-09-15 VITALS — BP 126/68 | HR 58 | Ht 64.0 in | Wt 132.1 lb

## 2023-09-15 DIAGNOSIS — K219 Gastro-esophageal reflux disease without esophagitis: Secondary | ICD-10-CM

## 2023-09-15 DIAGNOSIS — R1011 Right upper quadrant pain: Secondary | ICD-10-CM | POA: Diagnosis not present

## 2023-09-15 DIAGNOSIS — R634 Abnormal weight loss: Secondary | ICD-10-CM

## 2023-09-15 DIAGNOSIS — K449 Diaphragmatic hernia without obstruction or gangrene: Secondary | ICD-10-CM

## 2023-09-15 DIAGNOSIS — R110 Nausea: Secondary | ICD-10-CM | POA: Diagnosis not present

## 2023-09-15 MED ORDER — OMEPRAZOLE 20 MG PO CPDR: 20.0000 mg | DELAYED_RELEASE_CAPSULE | Freq: Two times a day (BID) | ORAL | 3 refills | Status: AC

## 2023-09-15 MED ORDER — PROMETHAZINE HCL 25 MG PO TABS: 25.0000 mg | ORAL_TABLET | Freq: Three times a day (TID) | ORAL | 2 refills | Status: AC | PRN

## 2023-09-15 NOTE — Patient Instructions (Addendum)
_______________________________________________________  If your blood pressure at your visit was 140/90 or greater, please contact your primary care physician to follow up on this.  _______________________________________________________  If you are age 82 or older, your body mass index should be between 23-30. Your Body mass index is 22.68 kg/m. If this is out of the aforementioned range listed, please consider follow up with your Primary Care Provider.  If you are age 74 or younger, your body mass index should be between 19-25. Your Body mass index is 22.68 kg/m. If this is out of the aformentioned range listed, please consider follow up with your Primary Care Provider.   ________________________________________________________  The  GI providers would like to encourage you to use [facility] to communicate with providers for non-urgent requests or questions.  Due to long hold times on the telephone, sending your provider a message by [facility] may be a faster and more efficient way to get a response.  Please allow 48 business hours for a response.  Please remember that this is for non-urgent requests.  _______________________________________________________  We have sent the following medications to your pharmacy for you to pick up at your convenience: Omeprazole 20mg  2 times a day and when better than daily Phenergan  Small and frequent meals  It was a pleasure to see you today!  Thank you for trusting me with your gastrointestinal care!

## 2023-09-15 NOTE — Progress Notes (Signed)
Chief Complaint: Abdominal pain  Referring Provider:  Paulina Fusi, MD      ASSESSMENT AND PLAN;   #1. RUQ abdominal pain (positive carnett sign) with episodic nausea. Pt is s/p chole in past and neg ERCP as below.  CT AP with contrast May 2024: neg  H/O GERD on chronic omeprazole. Neg EGD 07/2019 for etiology. Neg CT 06/2019(Cannot do MRCP due to pacemaker). Neg GES 08/2019. Neg [place] 2023 except for fatty liver.  #2. H/O Elevated alkaline phosphatase (resolved - normal alk phos at 81 06/2023). H/O cholecystectomy.  S/P ERCP 04/26/2015-Nl cholangiogram, sphincterotomy at [facility].  #3. GERD with small HH   Plan:  -Increase omeprazole 20 mg p.o. BID (#180), 3RF -Phenergan 25mg  po Q8hrs #30, 2RF fall precautions. -Small and frequent meals -FU thereafter   HPI:    Amanda Bryant is a 82 y.o. female  For follow-up visit  Discussed the use of AI scribe software for clinical note transcription with the patient, who gave verbal consent to proceed.  History of Present Illness   The patient presents with recurrent episodes of epigastric pain, described as severe heartburn. They express concern that their current stomach medication may not be sufficient to manage these symptoms. The patient also reports experiencing "spells" of nausea, which are effectively managed with Phenergan. They request a refill of this medication.  The patient has been taking gabapentin for the epigastric pain, which is located centrally. They have a known hiatal hernia, which they suspect may be the cause of their discomfort. They deny the use of ibuprofen, Advil, or goody powders, but do take Tylenol for arthritis.  The patient's recent endoscopy revealed a 2 cm hiatal hernia, gastritis, and a duodenal diverticulum. The patient has been managing their symptoms with a 20 mg medication, which they believe may not be strong enough.  The patient also has a pacemaker, which was recently checked and reported to  have approximately two years of function remaining. They report gaining a small amount of weight recently, but do not specify the exact amount. The patient's recent blood work was reported as normal, including CBC, pancreatic function, and hemoglobin levels. One of their liver tests was previously slightly elevated, but has since returned to normal.        From prev notes Still has "chronic right upper quadrant abdominal pain".  She describes occasional exacerbation which she describes as "attacks".    Most recent attack has been 3 weeks ago when she started having increasing abdominal pain associated with nausea and vomiting.  Phenergan does help.    She recently had negative CT Abdo/pelvis at [facility] May 2024.   Had negative extensive workup in past.   Phenergan helps her to sleep and relieves abdominal pain/nausea/vomiting.    No jaundice dark urine or pale stools.  No fever chills or night sweats.  Had normal solid-phase gastric emptying scan  Could not identify any exacerbating factors or any fatty food intolerance.  Has been having regular bowel movements ever since she eats fig/day.  Wt Readings from Last 3 Encounters:  06/11/23 128 lb (58.1 kg)  06/10/23 128 lb 2 oz (58.1 kg)  04/01/23 127 lb 9.6 oz (57.9 kg)     Past GI procedures:  EGD 06/11/2023: - 2 cm hiatal hernia. - Atrophic gastritis. Biopsied. - Non- bleeding duodenal diverticulum. - The examination was otherwise normal.  CT AP with contrast 01/2023 IMPRESSION: No acute findings or other significant abnormality. No radiographic evidence of malignancy.  Aortic Atherosclerosis (ICD10-I70.0).   -Colonoscopy 07/2011 6 mm colon polyp s/p polypectomy, mild sigmoid diverticulosis, int Hoids Bx-hyperplastic. -EGD 07/01/2019 2 cm hiatal hernia, moderate gastritis, duodenal diverticulum. Bx - neg for celiac/HP.  Negative small bowel biopsies. -CT AP with contrast 06/2019: No acute abnormalities. ?thickening of  the duodenum/jejunum, cholecystectomy with pneumobilia (prev ERCP) -GES 08/2019- Nl Past Medical History:  Diagnosis Date   Arthritis    Complete heart block (HCC)    intermittent, documented by prev ILR   GERD (gastroesophageal reflux disease)    HTN (hypertension)    Pacemaker    Implanted 2008   PONV (postoperative nausea and vomiting)    PVC's (premature ventricular contractions)    S/P cardiac catheterization    2001- nonobstructive disease; nl lv function   Shingles     Past Surgical History:  Procedure Laterality Date   ARM SURGERY Right    metal rod lower arm   BACK SURGERY  2010   CESAREAN SECTION  1967   CESAREAN SECTION  1972   CHOLECYSTECTOMY     COLONOSCOPY  07/22/2011   Colonic Polyp, mild sigmoid diverticulosis, small internal hemorrhoids. Bx: hyperplastic polyps.   EP IMPLANTABLE DEVICE N/A 06/10/2016   Procedure: PPM Generator Changeout;  Surgeon: Will Jorja Loa, MD;  Location: [facility];  Service: Cardiovascular;  Laterality: N/A;   ERCP  04/26/2015   Normal cholangiogram- no hilar/righthepatic duct stricture visualized.   JOINT REPLACEMENT Right    hand, x 2, 10/01/2022, 12/2022   TOTAL KNEE ARTHROPLASTY Right 07/15/2022   Procedure: TOTAL KNEE ARTHROPLASTY;  Surgeon: Joen Laura, MD;  Location: [facility];  Service: Orthopedics;  Laterality: Right;    Family History  Problem Relation Age of Onset   Heart disease Mother    Diabetes Mother    Diabetes Father    Heart disease Father    Diabetes Son    Colon cancer Neg Hx    Esophageal cancer Neg Hx    Rectal cancer Neg Hx    Stomach cancer Neg Hx     Social History   Tobacco Use   Smoking status: Never   Smokeless tobacco: Never  Vaping Use   Vaping status: Never Used  Substance Use Topics   Alcohol use: No    Alcohol/week: 0.0 standard drinks of alcohol   Drug use: No    Current Outpatient Medications  Medication Sig Dispense Refill   acetaminophen (TYLENOL) 650 MG CR  tablet Take 650-1,300 mg by mouth every 8 (eight) hours as needed for pain.     albuterol (VENTOLIN HFA) 108 (90 Base) MCG/ACT inhaler Inhale 2 puffs into the lungs every 6 (six) hours as needed for wheezing or shortness of breath.     alendronate (FOSAMAX) 70 MG tablet Take 70 mg by mouth every Saturday. Take with a full glass of water on an empty stomach.     Azelastine HCl 137 MCG/SPRAY SOLN Place 2 sprays into both nostrils 2 (two) times daily.     Calcium Carbonate (CALTRATE 600 PO) Take 1 tablet by mouth daily.      carboxymethylcellulose (REFRESH PLUS) 0.5 % SOLN Place 1 drop into both eyes 3 (three) times daily as needed (dry eyes).     carvedilol (COREG) 6.25 MG tablet Take 6.25 mg by mouth 2 (two) times daily.     Cholecalciferol (VITAMIN D) 1000 UNITS capsule Take 1,000 Units by mouth daily.       estradiol (ESTRACE) 0.1 MG/GM vaginal cream Place  1 Applicatorful vaginally as needed.     fish oil-omega-3 fatty acids 1000 MG capsule Take 2 g by mouth daily.       furosemide (LASIX) 20 MG tablet Take 20-40 mg by mouth 2 (two) times daily.     Homeopathic Products (LEG CRAMPS) SUBL Place 1 tablet under the tongue as needed (leg cramps). Hyland's     losartan (COZAAR) 50 MG tablet Take 50 mg by mouth daily.     Misc Natural Products (GLUCOSAMINE CHOND MSM FORMULA PO) Take 2 tablets by mouth daily.     Multiple Vitamins-Minerals (MULTIVITAMIN WITH MINERALS) tablet Take 1 tablet by mouth daily.     omeprazole (PRILOSEC) 20 MG capsule Take 20 mg by mouth daily.       promethazine (PHENERGAN) 25 MG tablet Take 1 tablet (25 mg total) by mouth every 8 (eight) hours as needed for nausea or vomiting. 30 tablet 2   promethazine (PHENERGAN) 25 MG tablet Take 1 tablet (25 mg total) by mouth every 8 (eight) hours as needed for nausea or vomiting. 30 tablet 2   promethazine-dextromethorphan (PROMETHAZINE-DM) 6.25-15 MG/5ML syrup Take 5 mLs by mouth 4 (four) times daily as needed.     Pseudoephedrine HCl  (SUDAFED PO) Take 1 tablet by mouth as needed.     rOPINIRole (REQUIP) 4 MG tablet Take 4 mg by mouth at bedtime.     simvastatin (ZOCOR) 20 MG tablet Take 20 mg by mouth daily.      Turmeric 500 MG CAPS Take 500 mg by mouth daily.     venlafaxine XR (EFFEXOR-XR) 75 MG 24 hr capsule Take 75 mg by mouth daily with breakfast.     zinc gluconate 50 MG tablet Take 50 mg by mouth daily.     No current facility-administered medications for this visit.    Allergies  Allergen Reactions   Codeine Nausea Only   Prednisone Palpitations and Other (See Comments)    Reaction: Increased heart rate   Ultram [Tramadol Hcl] Itching and Nausea And Vomiting   Tape     Pulls skin off    Terbinafine     Other Reaction(s): loss of taste   Prednisolone Acetate Palpitations    Review of Systems:  neg     Physical Exam:    BP 126/68   Pulse (!) 58   SpO2 95%  There were no vitals filed for this visit.  Gen: awake, alert, NAD HEENT: anicteric, no pallor CV: RRR, no mrg Pulm: CTA b/l Abd: soft, NT/ND, +BS throughout Ext: no c/c/e Neuro: nonfocal   Data Reviewed: I have personally reviewed following labs and imaging studies  CBC:    Latest Ref Rng & Units 06/10/2023    9:40 AM 07/16/2022    3:17 AM 07/03/2022   10:30 AM  CBC  WBC 4.0 - 10.5 K/uL 10.1  15.8  8.6   Hemoglobin 12.0 - 15.0 g/dL 29.5  28.4  13.2   Hematocrit 36.0 - 46.0 % 39.5  34.0  40.4   Platelets 150.0 - 400.0 K/uL 203.0  180  184     CMP:    Latest Ref Rng & Units 06/10/2023    9:40 AM 07/16/2022    3:17 AM 07/03/2022   10:30 AM  CMP  Glucose 70 - 99 mg/dL 99  440  102   BUN 6 - 23 mg/dL 25  16  18    Creatinine 0.40 - 1.20 mg/dL 7.25  3.66  4.40   Sodium 135 -  145 mEq/L 139  138  140   Potassium 3.5 - 5.1 mEq/L 4.0  4.0  4.6   Chloride 96 - 112 mEq/L 103  105  106   CO2 19 - 32 mEq/L 29  26  27    Calcium 8.4 - 10.5 mg/dL 9.2  8.7  9.1   Total Protein 6.0 - 8.3 g/dL 7.0     Total Bilirubin 0.2 - 1.2 mg/dL 0.4      Alkaline Phos 39 - 117 U/L 81     AST 0 - 37 U/L 33     ALT 0 - 35 U/L 34         Latest Ref Rng & Units 06/10/2023    9:40 AM 05/22/2022   11:40 AM 03/05/2018   12:04 PM  Hepatic Function  Total Protein 6.0 - 8.3 g/dL 7.0  6.3  6.4   Albumin 3.5 - 5.2 g/dL 3.9  3.8  4.0   AST 0 - 37 U/L 33  20  35   ALT 0 - 35 U/L 34  18  30   Alk Phosphatase 39 - 117 U/L 81  71  75   Total Bilirubin 0.2 - 1.2 mg/dL 0.4  0.4  0.4          Edman Circle, MD 09/15/2023, 2:19 PM  Cc: Paulina Fusi, MD

## 2023-09-16 DIAGNOSIS — M25561 Pain in right knee: Secondary | ICD-10-CM | POA: Diagnosis not present

## 2023-09-16 DIAGNOSIS — M25552 Pain in left hip: Secondary | ICD-10-CM | POA: Diagnosis not present

## 2023-09-16 DIAGNOSIS — M533 Sacrococcygeal disorders, not elsewhere classified: Secondary | ICD-10-CM | POA: Diagnosis not present

## 2023-09-18 DIAGNOSIS — M545 Low back pain, unspecified: Secondary | ICD-10-CM | POA: Diagnosis not present

## 2023-09-26 DIAGNOSIS — M545 Low back pain, unspecified: Secondary | ICD-10-CM | POA: Diagnosis not present

## 2023-09-29 DIAGNOSIS — M545 Low back pain, unspecified: Secondary | ICD-10-CM | POA: Diagnosis not present

## 2023-10-07 DIAGNOSIS — M7918 Myalgia, other site: Secondary | ICD-10-CM | POA: Diagnosis not present

## 2023-10-07 DIAGNOSIS — M545 Low back pain, unspecified: Secondary | ICD-10-CM | POA: Diagnosis not present

## 2023-10-15 DIAGNOSIS — M545 Low back pain, unspecified: Secondary | ICD-10-CM | POA: Diagnosis not present

## 2023-10-17 DIAGNOSIS — M545 Low back pain, unspecified: Secondary | ICD-10-CM | POA: Diagnosis not present

## 2023-10-21 ENCOUNTER — Telehealth: Payer: Self-pay | Admitting: *Deleted

## 2023-10-21 ENCOUNTER — Telehealth: Payer: Self-pay | Admitting: Internal Medicine

## 2023-10-21 DIAGNOSIS — E785 Hyperlipidemia, unspecified: Secondary | ICD-10-CM | POA: Diagnosis not present

## 2023-10-21 DIAGNOSIS — Z950 Presence of cardiac pacemaker: Secondary | ICD-10-CM | POA: Diagnosis not present

## 2023-10-21 DIAGNOSIS — I10 Essential (primary) hypertension: Secondary | ICD-10-CM | POA: Diagnosis not present

## 2023-10-21 DIAGNOSIS — Z01818 Encounter for other preprocedural examination: Secondary | ICD-10-CM | POA: Diagnosis not present

## 2023-10-21 NOTE — Telephone Encounter (Signed)
Pt has been scheduled tele preop appt 11/04/23. I explained to the pt that we just received the request today. I assured the pt that if I get a cancellation I will call her to schedule sooner tele appt. Med rec and consent are done.      Patient Consent for Virtual Visit        Amanda Bryant has provided verbal consent on 10/21/2023 for a virtual visit (video or telephone).   CONSENT FOR VIRTUAL VISIT FOR:  Amanda Bryant  By participating in this virtual visit I agree to the following:  I hereby voluntarily request, consent and authorize [facility] and its employed or contracted physicians, physician assistants, nurse practitioners or other licensed health care professionals (the Practitioner), to provide me with telemedicine health care services (the "Services") as deemed necessary by the treating Practitioner. I acknowledge and consent to receive the Services by the Practitioner via telemedicine. I understand that the telemedicine visit will involve communicating with the Practitioner through live audiovisual communication technology and the disclosure of certain medical information by electronic transmission. I acknowledge that I have been given the opportunity to request an in-person assessment or other available alternative prior to the telemedicine visit and am voluntarily participating in the telemedicine visit.  I understand that I have the right to withhold or withdraw my consent to the use of telemedicine in the course of my care at any time, without affecting my right to future care or treatment, and that the Practitioner or I may terminate the telemedicine visit at any time. I understand that I have the right to inspect all information obtained and/or recorded in the course of the telemedicine visit and may receive copies of available information for a reasonable fee.  I understand that some of the potential risks of receiving the Services via telemedicine include:  Delay or  interruption in medical evaluation due to technological equipment failure or disruption; Information transmitted may not be sufficient (e.g. poor resolution of images) to allow for appropriate medical decision making by the Practitioner; and/or  In rare instances, security protocols could fail, causing a breach of personal health information.  Furthermore, I acknowledge that it is my responsibility to provide information about my medical history, conditions and care that is complete and accurate to the best of my ability. I acknowledge that Practitioner's advice, recommendations, and/or decision may be based on factors not within their control, such as incomplete or inaccurate data provided by me or distortions of diagnostic images or specimens that may result from electronic transmissions. I understand that the practice of medicine is not an exact science and that Practitioner makes no warranties or guarantees regarding treatment outcomes. I acknowledge that a copy of this consent can be made available to me via my patient portal [facility] MyChart), or I can request a printed copy by calling the office of [facility].    I understand that my insurance will be billed for this visit.   I have read or had this consent read to me. I understand the contents of this consent, which adequately explains the benefits and risks of the Services being provided via telemedicine.  I have been provided ample opportunity to ask questions regarding this consent and the Services and have had my questions answered to my satisfaction. I give my informed consent for the services to be provided through the use of telemedicine in my medical care

## 2023-10-21 NOTE — Telephone Encounter (Signed)
   Name: Amanda Bryant  DOB: 10-29-1940  MRN: [id number]  Primary Cardiologist: None   Preoperative team, please contact this patient and set up a phone call appointment for further preoperative risk assessment. Please obtain consent and complete medication review. Thank you for your help.  I confirm that guidance regarding antiplatelet and oral anticoagulation therapy has been completed and, if necessary, noted below (none requested).   I also confirmed the patient resides in the state of [place]. As per [facility] Medical Board telemedicine laws, the patient must reside in the state in which the provider is licensed.   Joylene Grapes, NP 10/21/2023, 9:17 AM [facility]

## 2023-10-21 NOTE — Telephone Encounter (Signed)
Pt has been scheduled tele preop appt 11/04/23. I explained to the pt that we just received the request today. I assured the pt that if I get a cancellation I will call her to schedule sooner tele appt. Med rec and consent are done.

## 2023-10-21 NOTE — Telephone Encounter (Signed)
   Pre-operative Risk Assessment    Patient Name: Amanda Bryant  DOB: 01/19/1941 MRN: [id number]   Date of last office visit: 04/01/2023 Date of next office visit: none   Request for Surgical Clearance    Procedure:   L2 kyphoplasty  Date of Surgery:  Clearance TBD                                Surgeon:  Dr. Venita Lick Surgeon's Group or Practice Name:  [facility] Phone number:  [phone] Fax number:  [phone]   Type of Clearance Requested:   - Medical    Type of Anesthesia:  Local  with IV Regional   Additional requests/questions:    Sharen Hones   10/21/2023, 9:05 AM

## 2023-10-21 NOTE — Telephone Encounter (Signed)
Patient called to follow up on clearance request.

## 2023-10-24 ENCOUNTER — Telehealth: Payer: Self-pay | Admitting: Internal Medicine

## 2023-10-24 NOTE — Telephone Encounter (Signed)
Lvm or patient that unable to move appointment at this time due to no openings

## 2023-10-24 NOTE — Telephone Encounter (Signed)
Patient calling to see if her pre opp appt can be moved up. She states she is in a lot of pain. Please advise

## 2023-10-25 IMAGING — DX DG CHEST 2V
2 series · 2 of 2 positions shown · non-contrast
Comparison: Chest x-ray 01/11/2009.

CLINICAL DATA: Pacemaker placement.

EXAM:
CHEST - 2 VIEW

[dg chest 2 view (1 of 2)]
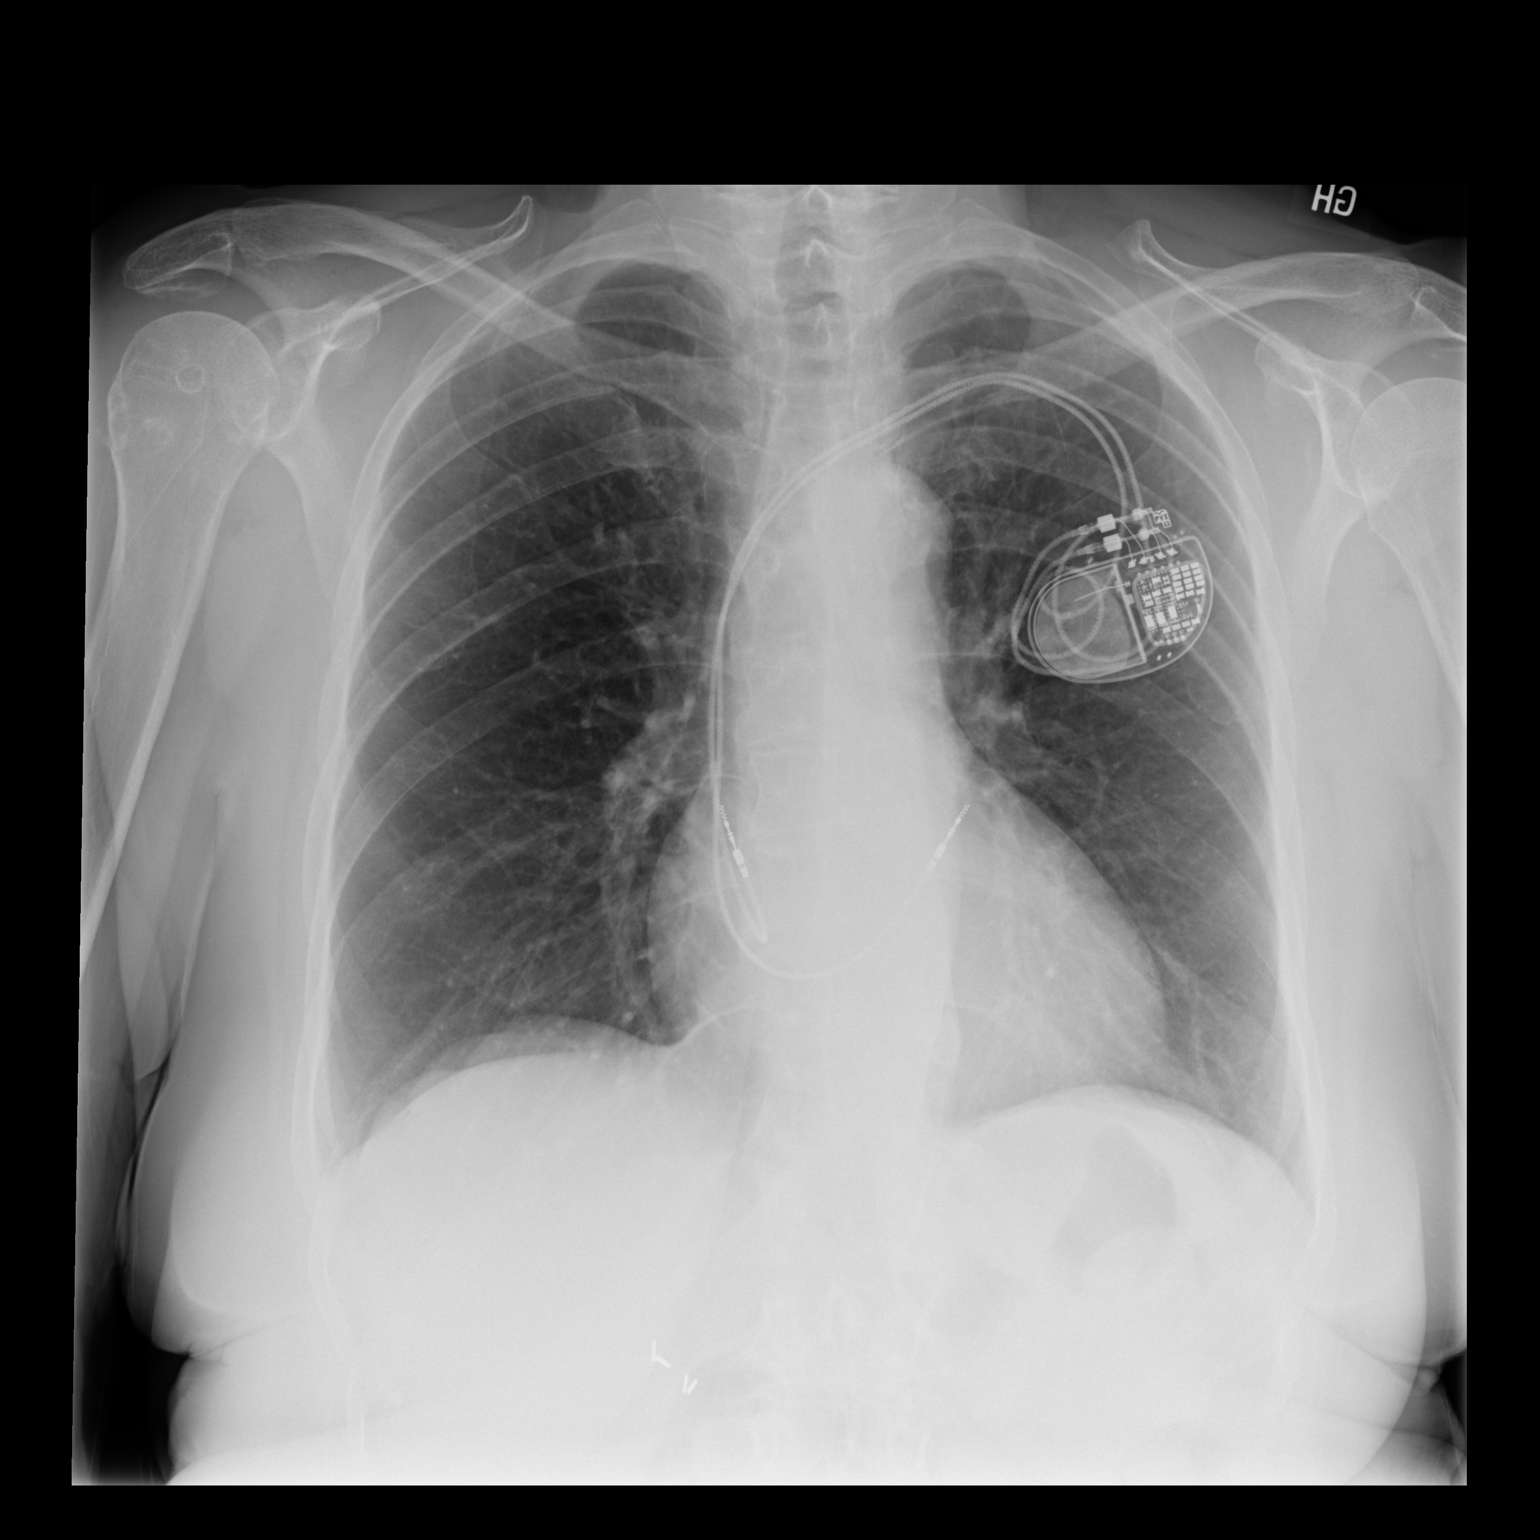

[dg chest 2 view (2 of 2)]
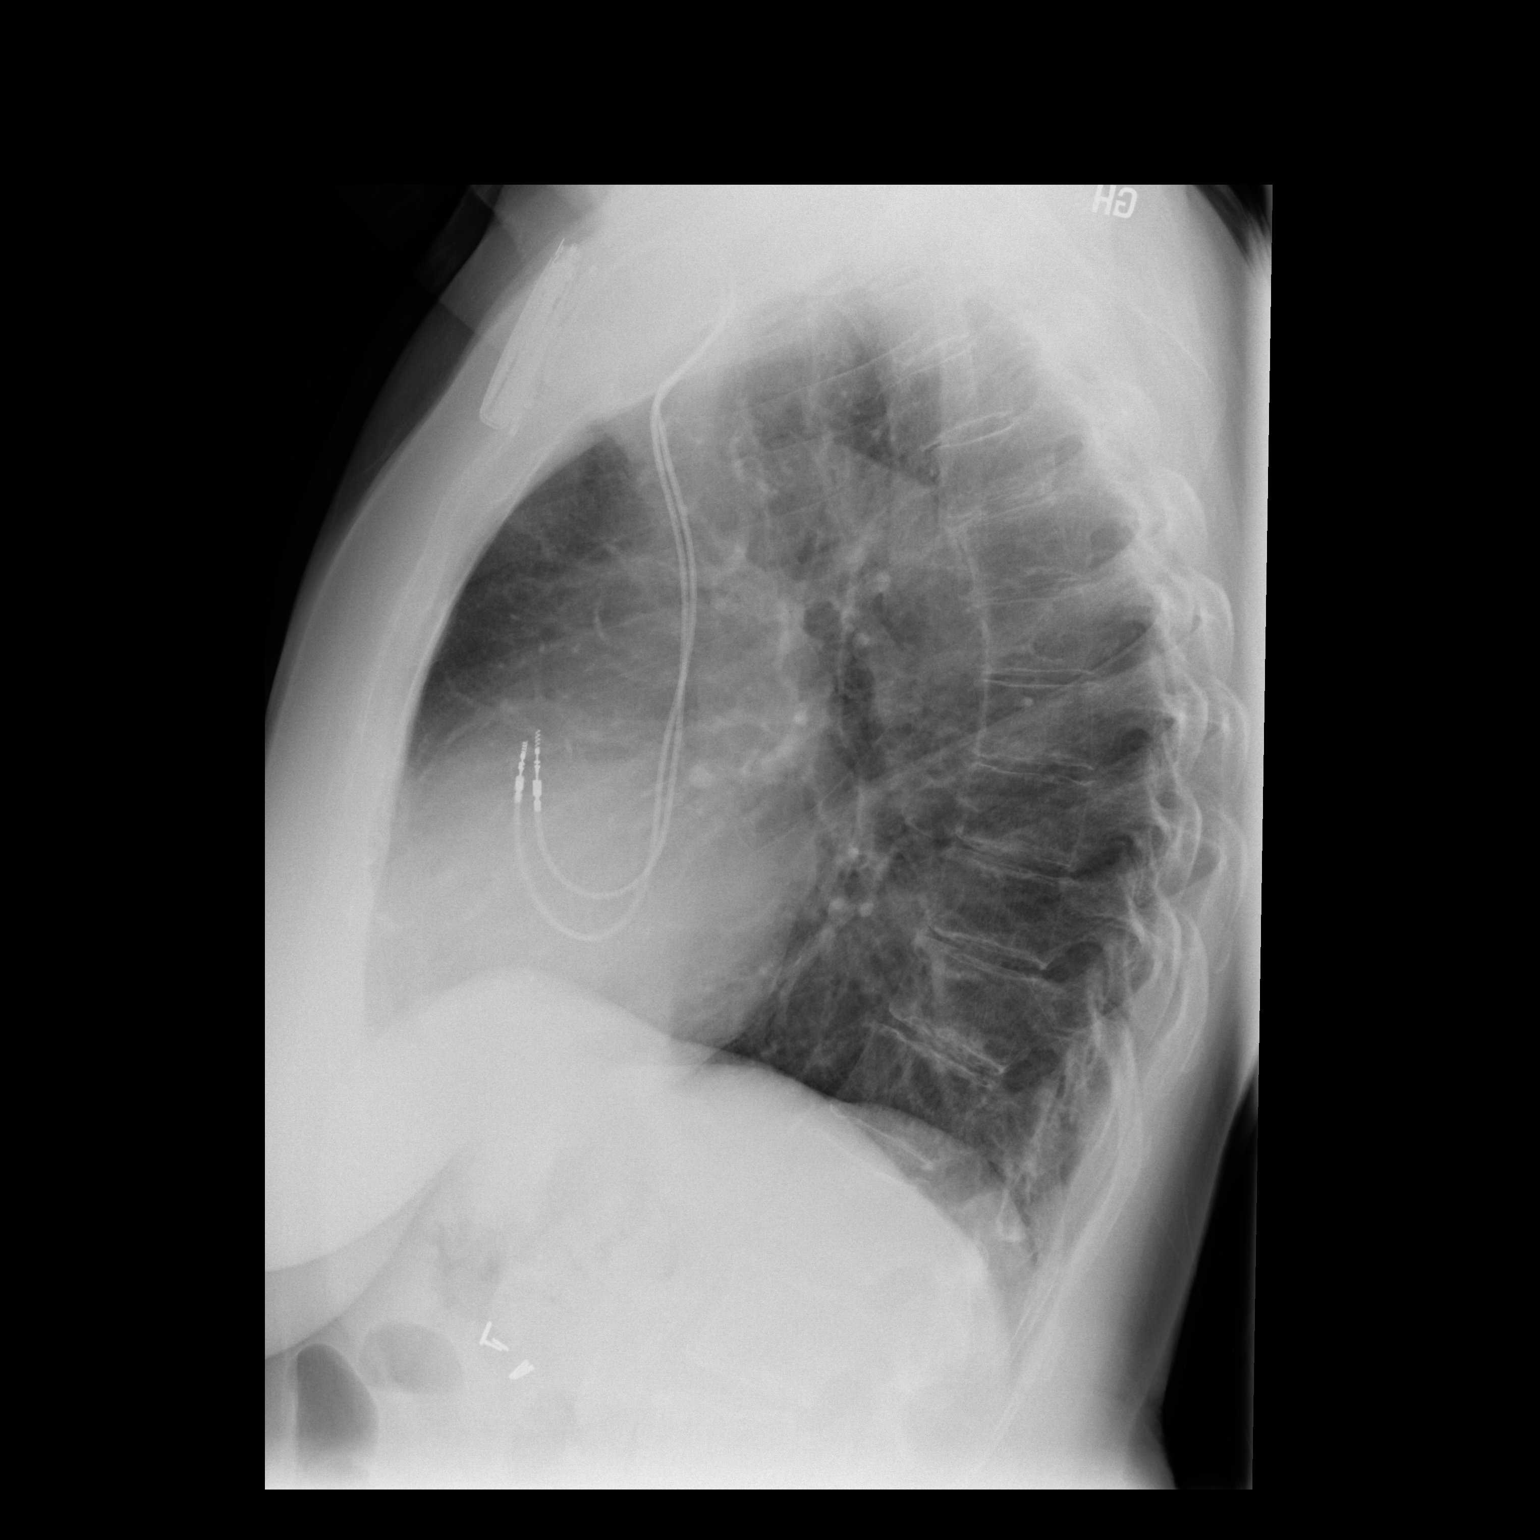

[2 of 2 positions shown; findings below may reference images not displayed]

FINDINGS: Cardiac pacer with lead tips over the right atrium and upper right
ventricle. Heart size normal. No pulmonary venous congestion. Mild
left base subsegmental atelectasis. No focal infiltrate. No pleural
effusion or pneumothorax. Small corticated cyst noted over the
proximal right humerus. Degenerative changes thoracic spine.
Surgical clips right upper quadrant.
IMPRESSION: Cardiac pacer with lead tips over the right atrium right ventricle.
Mild left base subsegmental atelectasis. No pneumothorax.

## 2023-11-04 ENCOUNTER — Ambulatory Visit: Payer: Medicare Other | Attending: Cardiology

## 2023-11-04 DIAGNOSIS — Z01810 Encounter for preprocedural cardiovascular examination: Secondary | ICD-10-CM

## 2023-11-04 DIAGNOSIS — S32020A Wedge compression fracture of second lumbar vertebra, initial encounter for closed fracture: Secondary | ICD-10-CM | POA: Diagnosis not present

## 2023-11-04 NOTE — Progress Notes (Signed)
 Virtual Visit via Telephone Note   Because of Amanda Bryant's co-morbid illnesses, she is at least at moderate risk for complications without adequate follow up.  This format is felt to be most appropriate for this patient at this time.  The patient did not have access to video technology/had technical difficulties with video requiring transitioning to audio format only (telephone).  All issues noted in this document were discussed and addressed.  No physical exam could be performed with this format.  Please refer to the patient's chart for her consent to telehealth for [facility]  Evaluation Performed:  Preoperative cardiovascular risk assessment _____________   Date:  11/04/2023   Patient ID:  Amanda Bryant, DOB Mar 27, 1941, MRN [id number] Patient Location:  Home Provider location:   Office  Primary Care Provider:  Keren Vicenta BRAVO, MD Primary Cardiologist:  None  Chief Complaint / Patient Profile   83 y.o. y/o female with a h/o complete heart block, PPM who is pending L2 kyphoplasty and presents today for telephonic preoperative cardiovascular risk assessment.  History of Present Illness    Amanda Bryant is a 83 y.o. female who presents via audio/video conferencing for a telehealth visit today.  Pt was last seen in cardiology clinic on 04/01/23 by Dr. Fernande.  At that time Amanda Bryant was doing well .  The patient is now pending procedure as outlined above. Since her last visit, she remained stable from a cardiac standpoint.  Today she denies chest pain, shortness of breath, lower extremity edema, fatigue, palpitations, melena, hematuria, hemoptysis, diaphoresis, weakness, presyncope, syncope, orthopnea, and PND.   Past Medical History    Past Medical History:  Diagnosis Date   Arthritis    Complete heart block (HCC)    intermittent, documented by prev ILR   GERD (gastroesophageal reflux disease)    HTN (hypertension)    Pacemaker    Implanted 2008   PONV  (postoperative nausea and vomiting)    PVC's (premature ventricular contractions)    S/P cardiac catheterization    2001- nonobstructive disease; nl lv function   Shingles    Past Surgical History:  Procedure Laterality Date   ARM SURGERY Right    metal rod lower arm   BACK SURGERY  2010   CESAREAN SECTION  1967   CESAREAN SECTION  1972   CHOLECYSTECTOMY     COLONOSCOPY  07/22/2011   Colonic Polyp, mild sigmoid diverticulosis, small internal hemorrhoids. Bx: hyperplastic polyps.   EP IMPLANTABLE DEVICE N/A 06/10/2016   Procedure: PPM Generator Changeout;  Surgeon: Will Gladis Norton, MD;  [facility]: [facility];  Service: Cardiovascular;  Laterality: N/A;   ERCP  04/26/2015   Normal cholangiogram- no hilar/righthepatic duct stricture visualized.   JOINT REPLACEMENT Right    hand, x 2, 10/01/2022, 12/2022   TOTAL KNEE ARTHROPLASTY Right 07/15/2022   Procedure: TOTAL KNEE ARTHROPLASTY;  Surgeon: Edna Toribio LABOR, MD;  Location: [facility];  Service: Orthopedics;  Laterality: Right;    Allergies  Allergies  Allergen Reactions   Codeine Nausea Only   Prednisone Palpitations and Other (See Comments)    Reaction: Increased heart rate   Ultram [Tramadol Hcl] Itching and Nausea And Vomiting   Tape     Pulls skin off    Terbinafine     Other Reaction(s): loss of taste   Prednisolone Acetate Palpitations    Home Medications    Prior to Admission medications   Medication Sig Start Date End Date Taking?  Authorizing Provider  acetaminophen  (TYLENOL ) 650 MG CR tablet Take 650-1,300 mg by mouth every 8 (eight) hours as needed for pain.    [provider]  albuterol  (VENTOLIN  HFA) 108 (90 Base) MCG/ACT inhaler Inhale 2 puffs into the lungs every 6 (six) hours as needed for wheezing or shortness of breath. 11/30/19   [provider]  alendronate (FOSAMAX) 70 MG tablet Take 70 mg by mouth every Saturday. Take with a full glass of water  on an empty stomach.     [provider]  Azelastine HCl 137 MCG/SPRAY SOLN Place 2 sprays into both nostrils 2 (two) times daily. 04/21/23   [provider]  Calcium Carbonate (CALTRATE 600 PO) Take 1 tablet by mouth daily.     [provider]  carboxymethylcellulose (REFRESH PLUS) 0.5 % SOLN Place 1 drop into both eyes 3 (three) times daily as needed (dry eyes).    [provider]  carvedilol  (COREG ) 6.25 MG tablet Take 6.25 mg by mouth 2 (two) times daily.    [provider]  Cholecalciferol  (VITAMIN D ) 1000 UNITS capsule Take 1,000 Units by mouth daily.      [provider]  estradiol (ESTRACE) 0.1 MG/GM vaginal cream Place 1 Applicatorful vaginally as needed. 03/31/23   [provider]  fish oil-omega-3 fatty acids 1000 MG capsule Take 2 g by mouth daily.      [provider]  furosemide (LASIX) 20 MG tablet Take 20-40 mg by mouth 2 (two) times daily. 06/17/22   [provider]  Homeopathic Products (LEG CRAMPS) SUBL Place 1 tablet under the tongue as needed (leg cramps). Hyland's    [provider]  losartan  (COZAAR ) 50 MG tablet Take 50 mg by mouth daily.    [provider]  Misc Natural Products (GLUCOSAMINE CHOND MSM FORMULA PO) Take 2 tablets by mouth daily.    [provider]  Multiple Vitamins-Minerals (MULTIVITAMIN WITH MINERALS) tablet Take 1 tablet by mouth daily.    [provider]  omeprazole  (PRILOSEC) 20 MG capsule Take 20 mg by mouth daily.      [provider]  omeprazole  (PRILOSEC) 20 MG capsule Take 1 capsule (20 mg total) by mouth 2 (two) times daily before a meal. 09/15/23   Charlanne Groom, MD  promethazine  (PHENERGAN ) 25 MG tablet Take 1 tablet (25 mg total) by mouth every 8 (eight) hours as needed for nausea or vomiting. 03/19/23   Charlanne Groom, MD  promethazine  (PHENERGAN ) 25 MG tablet Take 1 tablet (25 mg total) by mouth every 8 (eight) hours as needed for nausea or vomiting.  06/10/23   Charlanne Groom, MD  promethazine  (PHENERGAN ) 25 MG tablet Take 1 tablet (25 mg total) by mouth every 8 (eight) hours as needed for nausea or vomiting. 09/15/23   Charlanne Groom, MD  promethazine -dextromethorphan (PROMETHAZINE -DM) 6.25-15 MG/5ML syrup Take 5 mLs by mouth 4 (four) times daily as needed. 05/16/23   [provider]  Pseudoephedrine HCl (SUDAFED PO) Take 1 tablet by mouth as needed.    [provider]  rOPINIRole  (REQUIP ) 4 MG tablet Take 4 mg by mouth at bedtime. 05/20/19   [provider]  simvastatin  (ZOCOR ) 20 MG tablet Take 20 mg by mouth daily.  04/09/19   [provider]  Turmeric 500 MG CAPS Take 500 mg by mouth daily.    [provider]  venlafaxine  XR (EFFEXOR -XR) 75 MG 24 hr capsule Take 75 mg by mouth daily with breakfast.    [provider]  zinc gluconate 50 MG tablet Take 50 mg by mouth daily.    [provider]    Physical Exam    Vital Signs:  Amanda Bryant does not have vital signs available for review today.  Given telephonic nature of communication, physical exam is limited. AAOx3. NAD. Normal affect.  Speech and respirations are unlabored.  Accessory Clinical Findings    None  Assessment & Plan    1.  Preoperative Cardiovascular Risk Assessment:Procedure:   L2 kyphoplasty   Date of Surgery:  Clearance TBD                                  Surgeon:  Dr. Donaciano Sprang Surgeon's Group or Practice Name:  [facility] Phone number:  [phone] Fax number:  [phone]      Primary Cardiologist: Dr. Fernande  Chart reviewed as part of pre-operative protocol coverage. Given past medical history and time since last visit, based on ACC/AHA guidelines, Amanda Bryant would be at acceptable risk for the planned procedure without further cardiovascular testing.   Her RCRI is low risk, 0.9% risk of major cardiac event.  She is able to complete greater than 4 METS of physical  activity.  Patient was advised that if she develops new symptoms prior to surgery to contact our office to arrange a follow-up appointment.  He verbalized understanding.  I will route this recommendation to the requesting party via Epic fax function and remove from pre-op pool.       Time:   Today, I have spent 5 minutes with the patient with telehealth technology discussing medical history, symptoms, and management plan.     Amanda CHRISTELLA Beauvais, Amanda Bryant  11/04/2023, 7:11 AM

## 2023-11-05 DIAGNOSIS — K080 Exfoliation of teeth due to systemic causes: Secondary | ICD-10-CM | POA: Diagnosis not present

## 2023-11-21 DIAGNOSIS — S32020A Wedge compression fracture of second lumbar vertebra, initial encounter for closed fracture: Secondary | ICD-10-CM | POA: Diagnosis not present

## 2023-11-21 DIAGNOSIS — M8008XA Age-related osteoporosis with current pathological fracture, vertebra(e), initial encounter for fracture: Secondary | ICD-10-CM | POA: Diagnosis not present

## 2023-12-19 ENCOUNTER — Ambulatory Visit (INDEPENDENT_AMBULATORY_CARE_PROVIDER_SITE_OTHER)

## 2023-12-19 DIAGNOSIS — I442 Atrioventricular block, complete: Secondary | ICD-10-CM | POA: Diagnosis not present

## 2023-12-22 LAB — CUP PACEART REMOTE DEVICE CHECK
Battery Remaining Longevity: 27 mo
Battery Voltage: 2.94 V
Brady Statistic AP VP Percent: 0.95 %
Brady Statistic AP VS Percent: 0 %
Brady Statistic AS VP Percent: 98.9 %
Brady Statistic AS VS Percent: 0.15 %
Brady Statistic RA Percent Paced: 0.95 %
Brady Statistic RV Percent Paced: 99.74 %
Date Time Interrogation Session: 20250321214527
Implantable Lead Connection Status: 753985
Implantable Lead Connection Status: 753985
Implantable Lead Implant Date: 20081014
Implantable Lead Implant Date: 20081014
Implantable Lead Location: 753859
Implantable Lead Location: 753860
Implantable Lead Model: 5076
Implantable Lead Model: 5076
Implantable Pulse Generator Implant Date: 20170911
Lead Channel Impedance Value: 418 Ohm
Lead Channel Impedance Value: 475 Ohm
Lead Channel Impedance Value: 494 Ohm
Lead Channel Impedance Value: 608 Ohm
Lead Channel Pacing Threshold Amplitude: 0.375 V
Lead Channel Pacing Threshold Amplitude: 0.75 V
Lead Channel Pacing Threshold Pulse Width: 0.4 ms
Lead Channel Pacing Threshold Pulse Width: 0.4 ms
Lead Channel Sensing Intrinsic Amplitude: 1.625 mV
Lead Channel Sensing Intrinsic Amplitude: 1.625 mV
Lead Channel Sensing Intrinsic Amplitude: 10.75 mV
Lead Channel Sensing Intrinsic Amplitude: 10.75 mV
Lead Channel Setting Pacing Amplitude: 2 V
Lead Channel Setting Pacing Amplitude: 2.5 V
Lead Channel Setting Pacing Pulse Width: 0.4 ms
Lead Channel Setting Sensing Sensitivity: 5.6 mV
Zone Setting Status: 755011
Zone Setting Status: 755011

## 2023-12-26 DIAGNOSIS — M5451 Vertebrogenic low back pain: Secondary | ICD-10-CM | POA: Diagnosis not present

## 2024-01-17 ENCOUNTER — Encounter: Payer: Self-pay | Admitting: Internal Medicine

## 2024-01-27 NOTE — Progress Notes (Signed)
 Remote pacemaker transmission.

## 2024-01-27 NOTE — Addendum Note (Signed)
 Addended by: Lott Rouleau A on: 01/27/2024 02:53 PM   Modules accepted: Orders

## 2024-03-19 ENCOUNTER — Ambulatory Visit (INDEPENDENT_AMBULATORY_CARE_PROVIDER_SITE_OTHER)

## 2024-03-19 DIAGNOSIS — I442 Atrioventricular block, complete: Secondary | ICD-10-CM

## 2024-03-22 ENCOUNTER — Ambulatory Visit: Payer: Self-pay | Admitting: Cardiology

## 2024-03-22 LAB — CUP PACEART REMOTE DEVICE CHECK
Battery Remaining Longevity: 25 mo
Battery Voltage: 2.93 V
Brady Statistic AP VP Percent: 1.35 %
Brady Statistic AP VS Percent: 0 %
Brady Statistic AS VP Percent: 98.41 %
Brady Statistic AS VS Percent: 0.24 %
Brady Statistic RA Percent Paced: 1.35 %
Brady Statistic RV Percent Paced: 99.55 %
Date Time Interrogation Session: 20250620105542
Implantable Lead Connection Status: 753985
Implantable Lead Connection Status: 753985
Implantable Lead Implant Date: 20081014
Implantable Lead Implant Date: 20081014
Implantable Lead Location: 753859
Implantable Lead Location: 753860
Implantable Lead Model: 5076
Implantable Lead Model: 5076
Implantable Pulse Generator Implant Date: 20170911
Lead Channel Impedance Value: 418 Ohm
Lead Channel Impedance Value: 475 Ohm
Lead Channel Impedance Value: 475 Ohm
Lead Channel Impedance Value: 608 Ohm
Lead Channel Pacing Threshold Amplitude: 0.5 V
Lead Channel Pacing Threshold Amplitude: 0.75 V
Lead Channel Pacing Threshold Pulse Width: 0.4 ms
Lead Channel Pacing Threshold Pulse Width: 0.4 ms
Lead Channel Sensing Intrinsic Amplitude: 1.625 mV
Lead Channel Sensing Intrinsic Amplitude: 1.625 mV
Lead Channel Sensing Intrinsic Amplitude: 11.5 mV
Lead Channel Sensing Intrinsic Amplitude: 11.5 mV
Lead Channel Setting Pacing Amplitude: 2 V
Lead Channel Setting Pacing Amplitude: 2.5 V
Lead Channel Setting Pacing Pulse Width: 0.4 ms
Lead Channel Setting Sensing Sensitivity: 5.6 mV
Zone Setting Status: 755011
Zone Setting Status: 755011

## 2024-04-13 ENCOUNTER — Encounter: Payer: Self-pay | Admitting: Student

## 2024-04-13 ENCOUNTER — Ambulatory Visit: Attending: Student | Admitting: Student

## 2024-04-13 ENCOUNTER — Ambulatory Visit: Payer: Self-pay | Admitting: Cardiology

## 2024-04-13 VITALS — BP 116/50 | HR 58 | Ht 64.0 in | Wt 135.0 lb

## 2024-04-13 DIAGNOSIS — Z950 Presence of cardiac pacemaker: Secondary | ICD-10-CM

## 2024-04-13 DIAGNOSIS — I442 Atrioventricular block, complete: Secondary | ICD-10-CM | POA: Diagnosis not present

## 2024-04-13 DIAGNOSIS — I4719 Other supraventricular tachycardia: Secondary | ICD-10-CM

## 2024-04-13 LAB — CUP PACEART INCLINIC DEVICE CHECK
Battery Remaining Longevity: 26 mo
Battery Voltage: 2.93 V
Brady Statistic AP VP Percent: 1.58 %
Brady Statistic AP VS Percent: 0 %
Brady Statistic AS VP Percent: 97.66 %
Brady Statistic AS VS Percent: 0.76 %
Brady Statistic RA Percent Paced: 1.57 %
Brady Statistic RV Percent Paced: 98.85 %
Date Time Interrogation Session: 20250715115222
Implantable Lead Connection Status: 753985
Implantable Lead Connection Status: 753985
Implantable Lead Implant Date: 20081014
Implantable Lead Implant Date: 20081014
Implantable Lead Location: 753859
Implantable Lead Location: 753860
Implantable Lead Model: 5076
Implantable Lead Model: 5076
Implantable Pulse Generator Implant Date: 20170911
Lead Channel Impedance Value: 418 Ohm
Lead Channel Impedance Value: 475 Ohm
Lead Channel Impedance Value: 475 Ohm
Lead Channel Impedance Value: 589 Ohm
Lead Channel Pacing Threshold Amplitude: 0.5 V
Lead Channel Pacing Threshold Amplitude: 0.75 V
Lead Channel Pacing Threshold Pulse Width: 0.4 ms
Lead Channel Pacing Threshold Pulse Width: 0.4 ms
Lead Channel Sensing Intrinsic Amplitude: 1 mV
Lead Channel Sensing Intrinsic Amplitude: 12 mV
Lead Channel Sensing Intrinsic Amplitude: 12 mV
Lead Channel Sensing Intrinsic Amplitude: 2.625 mV
Lead Channel Setting Pacing Amplitude: 2 V
Lead Channel Setting Pacing Amplitude: 2.5 V
Lead Channel Setting Pacing Pulse Width: 0.4 ms
Lead Channel Setting Sensing Sensitivity: 5.6 mV
Zone Setting Status: 755011
Zone Setting Status: 755011

## 2024-04-13 NOTE — Patient Instructions (Signed)
 Medication Instructions:  Your physician recommends that you continue on your current medications as directed. Please refer to the Current Medication list given to you today.  *If you need a refill on your cardiac medications before your next appointment, please call your pharmacy*  Lab Work: None ordered If you have labs (blood work) drawn today and your tests are completely normal, you will receive your results only by: MyChart Message (if you have MyChart) OR A paper copy in the mail If you have any lab test that is abnormal or we need to change your treatment, we will call you to review the results.  Follow-Up: At [facility], you and your health needs are our priority.  As part of our continuing mission to provide you with exceptional heart care, our providers are all part of one team.  This team includes your primary Cardiologist (physician) and Advanced Practice Providers or APPs (Physician Assistants and Nurse Practitioners) who all work together to provide you with the care you need, when you need it.  Your next appointment:   1 year(s)  Provider:   You may see Boyce Byes, MD or one of the following Advanced Practice Providers on your designated Care Team:   Mertha Abrahams, [place] [address] [place], PA-C Suzann Riddle, NP Creighton Doffing, NP

## 2024-04-13 NOTE — Progress Notes (Signed)
  Electrophysiology Office Note:   ID:  Amanda Bryant, DOB 25-May-1941, MRN [id number]  Primary Cardiologist: None Electrophysiologist: OLE ONEIDA HOLTS, MD      History of Present Illness:   Amanda Bryant is a 83 y.o. female with h/o intermittent CHB s/p loop -> s/p PPM, paroxysmal AT, and HTN seen today for routine electrophysiology followup.   Since last being seen in our clinic the patient reports doing very well.  she denies chest pain, palpitations, dyspnea, PND, orthopnea, nausea, vomiting, dizziness, syncope, edema, weight gain, or early satiety.   Review of systems complete and found to be negative unless listed in HPI.   EP Information / Studies Reviewed:    EKG is ordered today. Personal review as below.  EKG Interpretation Date/Time:  Tuesday April 13 2024 11:31:15 EDT Ventricular Rate:  57 PR Interval:  234 QRS Duration:  156 QT Interval:  504 QTC Calculation: 490 R Axis:   96  Text Interpretation: Atrial-sensed ventricular-paced rhythm with prolonged AV conduction When compared with ECG of 01-Apr-2023 16:14, No significant change was found Confirmed by Lesia Sharper [phone]) on 04/13/2024 12:00:07 PM    PPM Interrogation-  reviewed in detail today,  See PACEART report.  Arrhythmia/Device History MDT ILR implanted 2008 for syncope Medtronic Dual Chamber PPM implanted 2008 -> gen change 05/2016 for CHB    Physical Exam:   VS:  BP (!) 116/50 (BP Location: Left Arm, Patient Position: Sitting, Cuff Size: Normal)   Pulse (!) 58   Ht 5' 4 (1.626 m)   Wt 135 lb (61.2 kg)   SpO2 97%   BMI 23.17 kg/m    Wt Readings from Last 3 Encounters:  04/13/24 135 lb (61.2 kg)  09/15/23 132 lb 2 oz (59.9 kg)  06/11/23 128 lb (58.1 kg)     GEN: No acute distress  NECK: No JVD; No carotid bruits CARDIAC: Regular rate and rhythm, no murmurs, rubs, gallops RESPIRATORY:  Clear to auscultation without rales, wheezing or rhonchi  ABDOMEN: Soft, non-tender,  non-distended EXTREMITIES:  No edema; No deformity   ASSESSMENT AND PLAN:    Symptomatic bradycardia s/p Medtronic PPM  Normal PPM function See Pace Art report No changes today  HTN Stable on current regimen   Paroxysmal AT Rare and asymptomatic. Follow by device.   Disposition:   Follow up with EP Team in 12 months  Signed, Sharper Prentice Lesia, PA-C

## 2024-05-17 NOTE — Progress Notes (Signed)
 Remote pacemaker transmission.

## 2024-05-17 NOTE — Addendum Note (Signed)
 Addended by: VICCI SELLER A on: 05/17/2024 12:29 PM   Modules accepted: Orders

## 2024-06-18 ENCOUNTER — Ambulatory Visit (INDEPENDENT_AMBULATORY_CARE_PROVIDER_SITE_OTHER)

## 2024-06-18 DIAGNOSIS — I442 Atrioventricular block, complete: Secondary | ICD-10-CM

## 2024-06-21 LAB — CUP PACEART REMOTE DEVICE CHECK
Battery Remaining Longevity: 24 mo
Battery Voltage: 2.92 V
Brady Statistic AP VP Percent: 1.12 %
Brady Statistic AP VS Percent: 0 %
Brady Statistic AS VP Percent: 98.74 %
Brady Statistic AS VS Percent: 0.14 %
Brady Statistic RA Percent Paced: 1.12 %
Brady Statistic RV Percent Paced: 99.73 %
Date Time Interrogation Session: 20250919103944
Implantable Lead Connection Status: 753985
Implantable Lead Connection Status: 753985
Implantable Lead Implant Date: 20081014
Implantable Lead Implant Date: 20081014
Implantable Lead Location: 753859
Implantable Lead Location: 753860
Implantable Lead Model: 5076
Implantable Lead Model: 5076
Implantable Pulse Generator Implant Date: 20170911
Lead Channel Impedance Value: 418 Ohm
Lead Channel Impedance Value: 456 Ohm
Lead Channel Impedance Value: 532 Ohm
Lead Channel Impedance Value: 646 Ohm
Lead Channel Pacing Threshold Amplitude: 0.375 V
Lead Channel Pacing Threshold Amplitude: 0.875 V
Lead Channel Pacing Threshold Pulse Width: 0.4 ms
Lead Channel Pacing Threshold Pulse Width: 0.4 ms
Lead Channel Sensing Intrinsic Amplitude: 1.25 mV
Lead Channel Sensing Intrinsic Amplitude: 1.25 mV
Lead Channel Sensing Intrinsic Amplitude: 11.25 mV
Lead Channel Sensing Intrinsic Amplitude: 11.25 mV
Lead Channel Setting Pacing Amplitude: 2 V
Lead Channel Setting Pacing Amplitude: 2.5 V
Lead Channel Setting Pacing Pulse Width: 0.4 ms
Lead Channel Setting Sensing Sensitivity: 5.6 mV
Zone Setting Status: 755011
Zone Setting Status: 755011

## 2024-06-22 NOTE — Progress Notes (Signed)
 Remote PPM Transmission

## 2024-06-23 ENCOUNTER — Ambulatory Visit: Payer: Self-pay | Admitting: Cardiology

## 2024-09-17 ENCOUNTER — Encounter

## 2024-09-24 ENCOUNTER — Ambulatory Visit: Attending: Cardiology

## 2024-09-24 DIAGNOSIS — I442 Atrioventricular block, complete: Secondary | ICD-10-CM

## 2024-09-27 LAB — CUP PACEART REMOTE DEVICE CHECK
Battery Remaining Longevity: 20 mo
Battery Voltage: 2.91 V
Brady Statistic AP VP Percent: 1.45 %
Brady Statistic AP VS Percent: 0 %
Brady Statistic AS VP Percent: 98.07 %
Brady Statistic AS VS Percent: 0.48 %
Brady Statistic RA Percent Paced: 1.45 %
Brady Statistic RV Percent Paced: 99.4 %
Date Time Interrogation Session: 20251226185239
Implantable Lead Connection Status: 753985
Implantable Lead Connection Status: 753985
Implantable Lead Implant Date: 20081014
Implantable Lead Implant Date: 20081014
Implantable Lead Location: 753859
Implantable Lead Location: 753860
Implantable Lead Model: 5076
Implantable Lead Model: 5076
Implantable Pulse Generator Implant Date: 20170911
Lead Channel Impedance Value: 437 Ohm
Lead Channel Impedance Value: 475 Ohm
Lead Channel Impedance Value: 532 Ohm
Lead Channel Impedance Value: 646 Ohm
Lead Channel Pacing Threshold Amplitude: 0.375 V
Lead Channel Pacing Threshold Amplitude: 0.75 V
Lead Channel Pacing Threshold Pulse Width: 0.4 ms
Lead Channel Pacing Threshold Pulse Width: 0.4 ms
Lead Channel Sensing Intrinsic Amplitude: 1.75 mV
Lead Channel Sensing Intrinsic Amplitude: 1.75 mV
Lead Channel Sensing Intrinsic Amplitude: 17 mV
Lead Channel Sensing Intrinsic Amplitude: 17 mV
Lead Channel Setting Pacing Amplitude: 2 V
Lead Channel Setting Pacing Amplitude: 2.5 V
Lead Channel Setting Pacing Pulse Width: 0.4 ms
Lead Channel Setting Sensing Sensitivity: 5.6 mV
Zone Setting Status: 755011
Zone Setting Status: 755011

## 2024-09-28 ENCOUNTER — Ambulatory Visit: Payer: Self-pay | Admitting: Cardiology

## 2024-09-29 NOTE — Progress Notes (Signed)
 Remote PPM Transmission

## 2024-10-03 ENCOUNTER — Other Ambulatory Visit: Payer: Self-pay | Admitting: Gastroenterology

## 2024-10-11 ENCOUNTER — Ambulatory Visit: Admitting: Podiatry

## 2024-10-11 DIAGNOSIS — L84 Corns and callosities: Secondary | ICD-10-CM | POA: Diagnosis not present

## 2024-10-11 DIAGNOSIS — M205X2 Other deformities of toe(s) (acquired), left foot: Secondary | ICD-10-CM

## 2024-10-11 NOTE — Progress Notes (Signed)
 "   Chief Complaint  Patient presents with   Callouses    Left 3rd toe, lateral side, inner-digital corn. She did have a cover on on. No spacer.  Not diabetic, no anti coag.     HPI: 84 y.o. female presents today with a corn on the lateral aspect of the left third toe.  She states that this corn gives her trouble from time to time and she needs to get shaved down.  She is also requesting some plantar calluses be shaved as well while she is here.  She is busy taking care of her husband who has been in and out of the hospital several times in the past year  Past Medical History:  Diagnosis Date   Arthritis    Complete heart block (HCC)    intermittent, documented by prev ILR   GERD (gastroesophageal reflux disease)    HTN (hypertension)    Pacemaker    Implanted 2008   PONV (postoperative nausea and vomiting)    PVC's (premature ventricular contractions)    S/P cardiac catheterization    2001- nonobstructive disease; nl lv function   Shingles    Past Surgical History:  Procedure Laterality Date   ARM SURGERY Right    metal rod lower arm   BACK SURGERY  2010   CESAREAN SECTION  1967   CESAREAN SECTION  1972   CHOLECYSTECTOMY     COLONOSCOPY  07/22/2011   Colonic Polyp, mild sigmoid diverticulosis, small internal hemorrhoids. Bx: hyperplastic polyps.   EP IMPLANTABLE DEVICE N/A 06/10/2016   Procedure: PPM Generator Changeout;  Surgeon: Will Gladis Norton, MD;  Location: [facility];  Service: Cardiovascular;  Laterality: N/A;   ERCP  04/26/2015   Normal cholangiogram- no hilar/righthepatic duct stricture visualized.   JOINT REPLACEMENT Right    hand, x 2, 10/01/2022, 12/2022   TOTAL KNEE ARTHROPLASTY Right 07/15/2022   Procedure: TOTAL KNEE ARTHROPLASTY;  Surgeon: Edna Toribio LABOR, MD;  Location: [facility];  Service: Orthopedics;  Laterality: Right;   Allergies[1]   Physical Exam: Palpable pedal pulse.  No open lesions are noted.  There is a focal corn/hyperkeratotic  lesion on the lateral aspect of the left third toe PIPJ.  There is pain on palpation of the lesion.  No erythema or edema is noted.  They are painful calluses bilateral submet 4 area.  Epicritic sensation intact  Assessment/Plan of Care: 1. Adductovarus rotation of toe, acquired, left   2. Corns     Discussed findings with the patient today.  The lesions were shaved uneventfully with a sterile #313 blade revealing intact skin underneath.  She can continue with her toe spacers and moleskin as needed.  If this continues to give her trouble she may need to have the acquired adductovarus rotation of the toe corrected surgically.  Follow-up as needed   Awanda CHARM [place], DPM, FACFAS [facility]     [address] [place], [place] [address]                Office [phone]  Fax [phone]    [1]  Allergies Allergen Reactions   Codeine Nausea Only   Prednisone Palpitations and Other (See Comments)    Reaction: Increased heart rate  Ultram [Tramadol Hcl] Itching and Nausea And Vomiting   Tape     Pulls skin off    Terbinafine     Other Reaction(s): loss of taste   Prednisolone Acetate Palpitations   "

## 2024-12-17 ENCOUNTER — Encounter

## 2024-12-24 ENCOUNTER — Ambulatory Visit

## 2025-03-18 ENCOUNTER — Encounter
